# Patient Record
Sex: Female | Born: 1935 | Race: White | Hispanic: No | Smoking: Never smoker
Health system: Southern US, Community
[De-identification: ages and names within clinical notes are randomized; demographics above are authoritative.]

## PROBLEM LIST (undated history)

## (undated) DIAGNOSIS — R42 Dizziness and giddiness: Secondary | ICD-10-CM

## (undated) DIAGNOSIS — M160 Bilateral primary osteoarthritis of hip: Secondary | ICD-10-CM

## (undated) DIAGNOSIS — Z9289 Personal history of other medical treatment: Secondary | ICD-10-CM

## (undated) DIAGNOSIS — G8929 Other chronic pain: Secondary | ICD-10-CM

## (undated) DIAGNOSIS — I38 Endocarditis, valve unspecified: Secondary | ICD-10-CM

## (undated) DIAGNOSIS — I10 Essential (primary) hypertension: Secondary | ICD-10-CM

## (undated) DIAGNOSIS — I480 Paroxysmal atrial fibrillation: Secondary | ICD-10-CM

## (undated) DIAGNOSIS — M5416 Radiculopathy, lumbar region: Secondary | ICD-10-CM

## (undated) DIAGNOSIS — I493 Ventricular premature depolarization: Secondary | ICD-10-CM

## (undated) DIAGNOSIS — E785 Hyperlipidemia, unspecified: Secondary | ICD-10-CM

## (undated) HISTORY — DX: Paroxysmal atrial fibrillation: I48.0

## (undated) HISTORY — DX: Personal history of other medical treatment: Z92.89

## (undated) HISTORY — PX: TONSILLECTOMY: SUR1361

## (undated) HISTORY — DX: Endocarditis, valve unspecified: I38

## (undated) HISTORY — PX: TONGUE BIOPSY: SHX1075

## (undated) HISTORY — DX: Radiculopathy, lumbar region: M54.16

## (undated) HISTORY — DX: Other chronic pain: G89.29

## (undated) HISTORY — PX: NM MYOVIEW LTD: HXRAD82

## (undated) HISTORY — PX: TOTAL ABDOMINAL HYSTERECTOMY W/ BILATERAL SALPINGOOPHORECTOMY: SHX83

## (undated) HISTORY — PX: TRANSTHORACIC ECHOCARDIOGRAM: SHX275

## (undated) HISTORY — DX: Bilateral primary osteoarthritis of hip: M16.0

## (undated) HISTORY — PX: BREAST EXCISIONAL BIOPSY: SUR124

## (undated) HISTORY — DX: Essential (primary) hypertension: I10

## (undated) HISTORY — DX: Ventricular premature depolarization: I49.3

## (undated) HISTORY — DX: Hyperlipidemia, unspecified: E78.5

## (undated) HISTORY — PX: COLONOSCOPY: SHX174

---

## 2004-08-12 ENCOUNTER — Ambulatory Visit: Payer: Self-pay | Admitting: Family Medicine

## 2005-09-23 ENCOUNTER — Ambulatory Visit: Payer: Self-pay | Admitting: Family Medicine

## 2006-12-16 ENCOUNTER — Ambulatory Visit: Payer: Self-pay | Admitting: Family Medicine

## 2007-12-19 ENCOUNTER — Ambulatory Visit: Payer: Self-pay | Admitting: Family Medicine

## 2008-12-20 ENCOUNTER — Ambulatory Visit: Payer: Self-pay | Admitting: Family Medicine

## 2009-12-23 ENCOUNTER — Ambulatory Visit: Payer: Self-pay | Admitting: Family Medicine

## 2010-07-31 ENCOUNTER — Emergency Department: Payer: Self-pay | Admitting: Emergency Medicine

## 2011-02-23 ENCOUNTER — Ambulatory Visit: Payer: Self-pay | Admitting: Family Medicine

## 2011-08-22 ENCOUNTER — Emergency Department: Payer: Self-pay | Admitting: Emergency Medicine

## 2012-01-27 DIAGNOSIS — I493 Ventricular premature depolarization: Secondary | ICD-10-CM

## 2012-01-27 HISTORY — DX: Ventricular premature depolarization: I49.3

## 2012-01-27 HISTORY — PX: OTHER SURGICAL HISTORY: SHX169

## 2012-03-15 ENCOUNTER — Ambulatory Visit: Payer: Self-pay | Admitting: Family Medicine

## 2013-03-17 ENCOUNTER — Ambulatory Visit: Payer: Self-pay | Admitting: Family Medicine

## 2013-05-05 ENCOUNTER — Ambulatory Visit: Payer: Self-pay | Admitting: Physical Medicine and Rehabilitation

## 2013-09-27 ENCOUNTER — Emergency Department: Payer: Self-pay | Admitting: Emergency Medicine

## 2014-01-13 ENCOUNTER — Emergency Department: Payer: Self-pay | Admitting: Emergency Medicine

## 2014-01-13 LAB — CBC
HCT: 42.4 % (ref 35.0–47.0)
HGB: 14 g/dL (ref 12.0–16.0)
MCH: 29.5 pg (ref 26.0–34.0)
MCHC: 32.9 g/dL (ref 32.0–36.0)
MCV: 90 fL (ref 80–100)
Platelet: 223 10*3/uL (ref 150–440)
RBC: 4.73 10*6/uL (ref 3.80–5.20)
RDW: 13.5 % (ref 11.5–14.5)
WBC: 7.5 10*3/uL (ref 3.6–11.0)

## 2014-01-13 LAB — COMPREHENSIVE METABOLIC PANEL
ALK PHOS: 93 U/L
ANION GAP: 9 (ref 7–16)
Albumin: 3.9 g/dL (ref 3.4–5.0)
BILIRUBIN TOTAL: 0.4 mg/dL (ref 0.2–1.0)
BUN: 34 mg/dL — ABNORMAL HIGH (ref 7–18)
Calcium, Total: 8.8 mg/dL (ref 8.5–10.1)
Chloride: 106 mmol/L (ref 98–107)
Co2: 24 mmol/L (ref 21–32)
Creatinine: 1.03 mg/dL (ref 0.60–1.30)
EGFR (African American): 59 — ABNORMAL LOW
EGFR (Non-African Amer.): 51 — ABNORMAL LOW
Glucose: 134 mg/dL — ABNORMAL HIGH (ref 65–99)
OSMOLALITY: 287 (ref 275–301)
POTASSIUM: 3.4 mmol/L — AB (ref 3.5–5.1)
SGOT(AST): 54 U/L — ABNORMAL HIGH (ref 15–37)
SGPT (ALT): 35 U/L (ref 12–78)
Sodium: 139 mmol/L (ref 136–145)
Total Protein: 8 g/dL (ref 6.4–8.2)

## 2014-01-13 LAB — PROTIME-INR
INR: 0.9
Prothrombin Time: 12 secs (ref 11.5–14.7)

## 2014-01-13 LAB — CK TOTAL AND CKMB (NOT AT ARMC)
CK, Total: 110 U/L
CK-MB: 2.6 ng/mL (ref 0.5–3.6)

## 2014-01-13 LAB — TROPONIN I: Troponin-I: <0.02 ng/mL

## 2014-01-13 LAB — APTT: Activated PTT: 32.5 secs (ref 23.6–35.9)

## 2014-03-19 ENCOUNTER — Ambulatory Visit: Payer: Self-pay | Admitting: Family Medicine

## 2014-03-21 DIAGNOSIS — M5416 Radiculopathy, lumbar region: Secondary | ICD-10-CM | POA: Insufficient documentation

## 2014-08-30 DIAGNOSIS — R079 Chest pain, unspecified: Secondary | ICD-10-CM | POA: Insufficient documentation

## 2014-11-21 ENCOUNTER — Ambulatory Visit: Payer: Self-pay | Admitting: Cardiovascular Disease

## 2014-11-28 ENCOUNTER — Ambulatory Visit: Payer: Self-pay | Admitting: Cardiovascular Disease

## 2014-12-11 ENCOUNTER — Ambulatory Visit: Payer: Self-pay | Admitting: Orthopedic Surgery

## 2014-12-12 ENCOUNTER — Encounter: Payer: Self-pay | Admitting: *Deleted

## 2014-12-12 ENCOUNTER — Ambulatory Visit (INDEPENDENT_AMBULATORY_CARE_PROVIDER_SITE_OTHER): Payer: Medicare Other | Admitting: Cardiology

## 2014-12-12 ENCOUNTER — Encounter: Payer: Self-pay | Admitting: Cardiology

## 2014-12-12 VITALS — BP 112/82 | HR 75 | Ht 67.0 in | Wt 161.0 lb

## 2014-12-12 DIAGNOSIS — I10 Essential (primary) hypertension: Secondary | ICD-10-CM

## 2014-12-12 DIAGNOSIS — I8393 Asymptomatic varicose veins of bilateral lower extremities: Secondary | ICD-10-CM

## 2014-12-12 DIAGNOSIS — R000 Tachycardia, unspecified: Secondary | ICD-10-CM

## 2014-12-12 DIAGNOSIS — I493 Ventricular premature depolarization: Secondary | ICD-10-CM

## 2014-12-12 DIAGNOSIS — I38 Endocarditis, valve unspecified: Secondary | ICD-10-CM

## 2014-12-12 NOTE — Patient Instructions (Addendum)
Your physician has recommended you make the following change in your medication:  Take an additional 12.5 mg (1/2 tablet) of metoprolol for tachy palpitation episodes  If you feel your blood pressure is low the following morning after taking additional metoprolol you can take a half tablet of lisinopril (10 mg)  Take your metoprolol at 8 am and 7 pm daily    Start wearing compression stockings as discussed    Your physician wants you to follow-up in: 6 months. You will receive a reminder letter in the mail two months in advance. If you don't receive a letter, please call our office to schedule the follow-up appointment.

## 2014-12-14 ENCOUNTER — Encounter: Payer: Self-pay | Admitting: Cardiology

## 2014-12-14 DIAGNOSIS — I8393 Asymptomatic varicose veins of bilateral lower extremities: Secondary | ICD-10-CM | POA: Insufficient documentation

## 2014-12-14 DIAGNOSIS — I493 Ventricular premature depolarization: Secondary | ICD-10-CM | POA: Insufficient documentation

## 2014-12-14 DIAGNOSIS — I10 Essential (primary) hypertension: Secondary | ICD-10-CM | POA: Insufficient documentation

## 2014-12-14 DIAGNOSIS — R000 Tachycardia, unspecified: Secondary | ICD-10-CM | POA: Insufficient documentation

## 2014-12-14 DIAGNOSIS — I38 Endocarditis, valve unspecified: Secondary | ICD-10-CM | POA: Insufficient documentation

## 2014-12-14 NOTE — Assessment & Plan Note (Signed)
Excellent blood pressure control. She is actually borderline hypotensive. I am a little bit leery of her being on 20 mg of lisinopril with about potentially using extra beta blocker dosing. If I choose to increase her beta blocker dose, I would probably reduce her lisinopril dose to 10 mg.

## 2014-12-14 NOTE — Assessment & Plan Note (Signed)
She is a little bothered by her varicose veins they're somewhat distressing as far as visualization. Her edema is also starting to bother her a little bit.  In the absence of PND and orthopnea with relatively normal echocardiogram, I am inclined to believe that this is more related to venous stasis and right-sided heart failure.  I recommended that she wears Compression Stockings 20-30 gram. I would prefer to avoid diuretics.

## 2014-12-14 NOTE — Progress Notes (Signed)
PATIENT: Brandi Scott MRN: [id number] DOB: 19-Aug-1929 PCP: Jerl MinaHEDRICK, JAMES, MD  Clinic Note: Chief Complaint  Patient presents with  . other    Former Brandi Scott pt would like 2nd opinion. Meds reviewed verbally with pt.  . Palpitations    History of documented PVCs    HPI: Brandi ShearerFlorence J Scott is a 81 y.o. female with a PMH below who presents today for well documented mild, asymptomatic valvular disease with mild mitral insufficiency and congestive insufficiency as well as Holter monitor identified PVCs occasionally in bigeminy. She was [facility] -- she had had an echocardiogram and Myoview back in 2013 we'll monitor. She had a followup echocardiogram in May of 2015 and Myoview in December 2015 -- the echocardiogram revealed improved overall bowel function, and still stable nonischemic Exercise Myoview. The  1 notable thing for her stress test was that she really did not exercise very long. This would indicate decreased exercise capacity.  I reviewed the results from the forwarded reports and have summarized them below the past surgical/past medical history section. -- clinic notes and study results were forwarded from Dr. Gwen Scott and her PCP. She is a close personal friend of a patient who is now seeing me, and asked to be referred for second opinion. She has been treated with low-dose beta blocker for palpitations. She has never had any syncopal episodes and has not been overly symptomatic with exception of just the annoyance of palpitations.  Interval History: she has been having episodic paroxysmal of heart palpitations that seem to last a few minutes to maybe 15 minutes. They're associated with rapid pounding in her chest, able to "hear her heartbeat and she feels a sense of tightness and tingling that would go up and from her chest to her left shoulder and down her arm. She definitely feels are going up and feels that he has weakness down the left greater  than right arm. She has felt as though she may pass out but has not actually truly passed out. She does not drink caffeine.  She notes that her episodesusually happen at night or near the end of the day last evening before bed. In the absence of these episodes, she denies any resting or exertional chest tightness or pressure. She does not have any resting or exertional dyspnea. These episodes are not associated with dyspnea. She has felt lightheaded and dizzy only when the palpitation episodes, no true syncope. No TIA or amaurosis fugax symptoms.  She has bilateral extremity edema that is mild. She does not have any PND or orthopnea. She has varicose veins with some skin discoloration bilaterally but lead to some restless leg sensations and calf pains and cramping. A sense of heaviness and fullness in her legs. This is not exacerbated by exercise or activity.worse when on her feet for long period time and improved with raising her feet.  Past Medical History  Diagnosis Date  . Hyperlipidemia   . Essential hypertension   . 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 Valvular heart disease     2013 Echo: Moderate MR and moderate-severe TR on echocardiogram; Recheck Echo 2015: mild MR & TR  . Chronic radicular low back pain   . Osteoarthritis of both hips     And knees as well as back.     Prior Cardiac Evaluation and Past Surgical History: Past Surgical History  Procedure Laterality Date  . Total  abdominal hysterectomy w/ bilateral salpingoophorectomy    . Tongue biopsy    . Colonoscopy    . Tonsillectomy    . Nm myoview ltd  May 2013; Dec 2015    @ [facility] Cardiology: a) 2013: Exercise for 2:30 min (only), hypertensive response no ischemic changes. He has 74%, normal wall motion, no evidence of ischemia or infarction.; b) Ex 3:00 min (dyspnea) 3.3 METs HR 146 (106% MPHR) - no ECG changes, E 70%, no RWMA, No ischemia or infarction  .  Transthoracic echocardiogram  May 2013; May 2015    @ [facility] Cardiology: a) 2013 : Normal LV function, EF 65%, mild BiAE, Mild LVH, Mod MR, Mod-Severe TR; b) normal LV size, EF> 55%, mild LVH, mild MR, mild TR and  . Holter monitor  May 2013    @ [facility] Cardiology: Occasional PVCs (benign)    No Known Allergies  Current Outpatient Prescriptions  Medication Sig Dispense Refill  . aspirin 81 MG tablet Take 81 mg by mouth daily.    [name] gabapentin (NEURONTIN) 100 MG capsule Take 100 mg by mouth 3 (three) times daily.    [name] lisinopril (PRINIVIL,ZESTRIL) 20 MG tablet Take 20 mg by mouth daily.    . metoprolol tartrate (LOPRESSOR) 25 MG tablet Take 25 mg by mouth 2 (two) times daily.     No current facility-administered medications for this visit.   SOCIAL & FAMILY HISTORY  reports that she has never smoked. She does not have any smokeless tobacco history on file. She reports that she does not drink alcohol or use illicit drugs. she used to walk quite a bit at the mall, but has not been able to do so because of her back and hip pain with radiculopathy.  family history includes Alzheimer's disease in her mother; Heart attack in her father; Heart disease in her brother and maternal grandmother; Heart failure in her mother; Hypertension in her father; Stomach cancer in her brother.  ROS: A comprehensive Review of Systems - was performed Review of Systems  Constitutional: Negative for weight loss and malaise/fatigue.  HENT: Negative for congestion and nosebleeds.   Eyes: Negative for blurred vision.  Respiratory: Negative for cough, shortness of breath and wheezing.   Cardiovascular: Positive for chest pain (Notes discomfort in her chest when she has palpitations) and palpitations (Last episode >65month ago). Negative for claudication.  Gastrointestinal: Negative for blood in stool and melena.  Genitourinary: Negative for hematuria.  Musculoskeletal: Positive for myalgias (Bilateral  leg pain and cramping), back pain and joint pain (Hip and knee). Negative for falls.       Arthritic pains in the back radiating to right hip and the width what sounds like radiculopathy limits her activity.  Neurological: Negative for focal weakness, seizures, loss of consciousness (but occasionally may feel as though she could pass out it heart rate episodes persist), weakness and headaches.       Neuropathic pain mostly noted in the right side down to the knee.  Endo/Heme/Allergies: Does not bruise/bleed easily.  Psychiatric/Behavioral: Negative.  Negative for depression and memory loss. The patient is not nervous/anxious and does not have insomnia.   All other systems reviewed and are negative.  PHYSICAL EXAM BP 112/82 mmHg  Pulse 75  Ht  (1.702 m)  Wt 161 lb (73.029 kg)  BMI 25.21 kg/m2  HEENT: [place]/AT, EOMI, MMM, anicteric sclera General appearance: alert, cooperative, appears stated age, no distress and   welll-nourished and well-groomed. Has mild thoracic  kyphosis. Pleasant mood and affect. Answers questions appropriately. Seems somewhat timid. Neck: no adenopathy, no carotid bruit, no JVD and supple, symmetrical, trachea midline Lungs: clear to auscultation bilaterally, normal percussion bilaterally and Nonlabored, good air movement Heart: RRR, S1/S2 normal, soft HSM at apex with no R./G. Nondisplaced PMI. Abdomen: soft, non-tender; bowel sounds normal; no masses,  no organomegaly and No HJR Extremities: edema Trace/1+, varicose veins noted, venous stasis dermatitis noted and No C./C. No ulcers or lesions Pulses: 2+ and symmetric Skin: Thin, frail skin with diffuse mild ecchymoses. She has venous stasis changes in the extremities but not significant. No rashes or sores/lesions Neurologic: Mental status: alertness: alert, orientation: time, date, person, place, city, president, affect: normal and Pleasant, thought content exhibits logical connections Cranial nerves: normal  MSK:  thoracic kyphosis, normal strength 5/5 in B LE & UE.   Adult ECG Report  Rate: 75 ;  Rhythm: normal sinus rhythm  QRS Axis: 5 ;  PR Interval: 156 ;  QRS Duration: 90 ; QTc: 408 ; Voltages: borderline low  Conduction Disturbances: none;  Other Abnormalities: none  Narrative Interpretation: essentially stable/benign EKG  Recent Labs: none available  ASSESSMENT / PLAN: Very pleasant elderly woman with a long-standing history of palpitations.    I spent quite a long time (greater than 20 minutes),in addition to the 20 minutes evaluating the patient and reviewing her chart,  discussing my thoughts on her prior care and recommendations and discussing vagal maneuvers etc. Greater than 50% of the time spent was in counseling. In addition to that time with the patient I also spent well over 25 minutes reviewing her chart and in putting pertinent data in the Past Medical History/Past Surgical History section.  Problem List Items Addressed This Visit    Asymptomatic varicose veins of bilateral lower extremities: With mild edema (Chronic)    She is a little bothered by her varicose veins they're somewhat distressing as far as visualization. Her edema is also starting to bother her a little bit.  In the absence of PND and orthopnea with relatively normal echocardiogram, I am inclined to believe that this is more related to venous stasis and right-sided heart failure.  I recommended that she wears Compression Stockings 20-30 gram. I would prefer to avoid diuretics.        Essential hypertension (Chronic)    Excellent blood pressure control. She is actually borderline hypotensive. I am a little bit leery of her being on 20 mg of lisinopril with about potentially using extra beta blocker dosing. If I choose to increase her beta blocker dose, I would probably reduce her lisinopril dose to 10 mg.      Relevant Orders   EKG 12-Lead (Completed)   Frequent PVCs (Chronic)    On beta blocker. I am not sure  if these episodes are related to PVCs. She's had negative ischemic evaluation twice and relatively normal echocardiogram.      Mild mitral and tricuspid regurgitation on recent echo (Chronic)    Certainly when she had 3+ TR back in 2013, one could suspect there possibly being elevated pulmonary pressures the duodenum. However her recent echo did not suggest that she has no JVD nor does she have any PND orthopnea dyspnea just that her edema has do with a cardiac etiology. Mild MR and TR on echocardiogram and a 81 year old woman is not unexpected.  I would not pursue any additional evaluation.      Tachycardia palpitations - Primary (Chronic)    Her  episodes seem quite symptomatic, but very few and far between. She says her last episode was over a month ago.  She can think of no potential triggering conditions. The only thing she notes is that they usually occur in the evening. She does not drink coffee. She has had a Holter monitor that showed PVCs, however she states that the Holter monitor did not capture one of these episodes when she was wearing it. Unfortunately there is no real good option besides maybe a loop recorder to definitively know what happened. She can wear and monitor for entire month and not have an episode. She is on a beta blocker which should likely keep these episodes a day. I suggested that she could take an extra half to full dose for an episode of palpitations. Also discussed vagal maneuvers. In order to avoid orthostatic hypotension symptoms with increased beta blocker dose I would recommend that she takes additional beta blocker dose if she cuts her ACE inhibitor dose in half. I also recommend that she takes her beta blocker a little bit earlier in the evening as most of her episodes seem to happen at night.  She has had an ischemic evaluation an echocardiogram done very recently which were normal. I don't think any additional evaluation from that standpoint is warranted. I  simply think that expectant management is the best option.         No orders of the defined types were placed in this encounter.    Followup: 6 months  Mailynn Everly, Piedad Climes, M.D., M.S. Interventional Cardiologist   Pager # [phone]

## 2014-12-14 NOTE — Assessment & Plan Note (Signed)
Her episodes seem quite symptomatic, but very few and far between. She says her last episode was over a month ago.  She can think of no potential triggering conditions. The only thing she notes is that they usually occur in the evening. She does not drink coffee. She has had a Holter monitor that showed PVCs, however she states that the Holter monitor did not capture one of these episodes when she was wearing it. Unfortunately there is no real good option besides maybe a loop recorder to definitively know what happened. She can wear and monitor for entire month and not have an episode. She is on a beta blocker which should likely keep these episodes a day. I suggested that she could take an extra half to full dose for an episode of palpitations. Also discussed vagal maneuvers. In order to avoid orthostatic hypotension symptoms with increased beta blocker dose I would recommend that she takes additional beta blocker dose if she cuts her ACE inhibitor dose in half. I also recommend that she takes her beta blocker a little bit earlier in the evening as most of her episodes seem to happen at night.  She has had an ischemic evaluation an echocardiogram done very recently which were normal. I don't think any additional evaluation from that standpoint is warranted. I simply think that expectant management is the best option.

## 2014-12-14 NOTE — Assessment & Plan Note (Signed)
Certainly when she had 3+ TR back in 2013, one could suspect there possibly being elevated pulmonary pressures the duodenum. However her recent echo did not suggest that she has no JVD nor does she have any PND orthopnea dyspnea just that her edema has do with a cardiac etiology. Mild MR and TR on echocardiogram and a 81 year old woman is not unexpected.  I would not pursue any additional evaluation.

## 2014-12-14 NOTE — Assessment & Plan Note (Signed)
On beta blocker. I am not sure if these episodes are related to PVCs. She's had negative ischemic evaluation twice and relatively normal echocardiogram.

## 2015-05-20 ENCOUNTER — Other Ambulatory Visit: Payer: Self-pay | Admitting: Family Medicine

## 2015-05-20 DIAGNOSIS — Z1231 Encounter for screening mammogram for malignant neoplasm of breast: Secondary | ICD-10-CM

## 2015-05-21 ENCOUNTER — Other Ambulatory Visit: Payer: Self-pay | Admitting: Family Medicine

## 2015-05-21 ENCOUNTER — Ambulatory Visit
Admission: RE | Admit: 2015-05-21 | Discharge: 2015-05-21 | Disposition: A | Discharge status: Home / Self Care | Payer: Medicare Other | Source: Ambulatory Visit | Attending: Family Medicine | Admitting: Family Medicine

## 2015-05-21 DIAGNOSIS — Z1231 Encounter for screening mammogram for malignant neoplasm of breast: Secondary | ICD-10-CM

## 2015-05-29 DIAGNOSIS — M48062 Spinal stenosis, lumbar region with neurogenic claudication: Secondary | ICD-10-CM | POA: Insufficient documentation

## 2015-06-12 ENCOUNTER — Ambulatory Visit (INDEPENDENT_AMBULATORY_CARE_PROVIDER_SITE_OTHER): Payer: Medicare Other | Admitting: Cardiology

## 2015-06-12 ENCOUNTER — Encounter: Payer: Self-pay | Admitting: Cardiology

## 2015-06-12 VITALS — BP 100/62 | HR 100 | Ht 65.0 in | Wt 155.0 lb

## 2015-06-12 DIAGNOSIS — I8393 Asymptomatic varicose veins of bilateral lower extremities: Secondary | ICD-10-CM

## 2015-06-12 DIAGNOSIS — I493 Ventricular premature depolarization: Secondary | ICD-10-CM | POA: Diagnosis not present

## 2015-06-12 DIAGNOSIS — R000 Tachycardia, unspecified: Secondary | ICD-10-CM | POA: Diagnosis not present

## 2015-06-12 DIAGNOSIS — I10 Essential (primary) hypertension: Secondary | ICD-10-CM | POA: Diagnosis not present

## 2015-06-12 NOTE — Patient Instructions (Signed)
Medication Instructions:  Your physician recommends that you continue on your current medications as directed. Please refer to the Current Medication list given to you today.   Labwork: none  Testing/Procedures: none  Follow-Up: Your physician wants you to follow-up in: six months with Dr. Herbie Baltimore. You will receive a reminder letter in the mail two months in advance. If you don't receive a letter, please call our office to schedule the follow-up appointment.   Any Other Special Instructions Will Be Listed Below (If Applicable). For better heart rate control, you may take your evening dose of metoprolol earlier in the day. If heart rate continues to be elevated, you may take ONE extra dose of metoprolol in a day

## 2015-06-12 NOTE — Progress Notes (Signed)
PCP: [name], MD  Clinic Note: Chief Complaint  Patient presents with  . Palpitations    6 month follow up. Pt. had a spell of rapid heart beats 2-3 weeks ago, with feeling like could black out with shoulder and left arm pain/weakness with chest pain. Meds reviewed by the patient verbally.     HPI: Brandi Scott is a 81 y.o. female with a PMH below who presents today for 6 month f/u. She has well documented mild, asymptomatic valvular disease with mild mitral insufficiency and congestive insufficiency as well as Holter monitor identified PVCs occasionally in bigeminy. She was seeing Dr. Gwen Pounds at the [facility] -- she had had an echocardiogram and Myoview back in 2013 we'll monitor. She had a followup echocardiogram in May of 2015 and Myoview in December 2015 -- the echocardiogram revealed improved overall valve function, and still stable nonischemic Exercise Myoview. The 1 notable thing for her stress test was that she really did not exercise very long. This would indicate decreased exercise capacity.  Brandi Scott was last seen in March 2016.  Noted intermittent paroxysmal silk heart palpitations lasting a few minutes. Able to hear the heart rate. Mild bilateral lower short edema. No chest tightness or pressure except when heart racing.  Recent Hospitalizations: n/a  Studies Reviewed: no new studies  Since I last saw her, she has stopped taking Neurontin voluntarily. She does note that the neuropathy and shaking his recur now that she is all Neurontin.  Interval History: She presents today really without much in the way of complaints besides having one spell of rapid heartbeat lasting about 15 minutes on about 2-3 weeks ago. Basically she saw a snake in her house and got very anxious and scared. She felt quite panicky and noted that her heart rate went up dramatically. She felt lightheaded and dizzy and little bit short of breath this occurred. She did not actually  have true syncope but did have near syncopal symptoms. She had some discomfort in her left shoulder and arm. It was mostly a weak sensation. Overall this episode lasted about our and resolved spontaneously. She has never had something like this as traumatic as the current episode and has not had any since.  She started using the compression stockings for her lower she'll be edema and is noted dramatic improvement. She still has about 1-2+ edema, but is much better than it was before.  No PND, orthopnea   No chest pain or shortness of breath with rest or exertion. Besides this one episode noted above, she does feel the occasional palpitations  -  However, these is not associated with lightheadedness, dizziness, weakness or syncope/near syncope. No TIA/amaurosis fugax symptoms. No melena, hematochezia, , or epstaxis.  She occasionally has hematuria in the setting of a UTI. She actually was in to has gotten over UTI. No claudication.  Past Medical History  Diagnosis Date  . Hyperlipidemia   . Essential hypertension   . 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 Valvular heart disease     2013 Echo: Moderate MR and moderate-severe TR on echocardiogram; Recheck Echo 2015: mild MR & TR  . Chronic radicular low back pain   . Osteoarthritis of both hips     And knees as well as back.     Past Surgical History  Procedure Laterality Date  . Total abdominal hysterectomy w/ bilateral salpingoophorectomy    . Tongue biopsy    .  Colonoscopy    . Tonsillectomy    . Nm myoview ltd  May 2013; Dec 2015    @ [facility] Cardiology: a) 2013: Exercise for 2:30 min (only), hypertensive response no ischemic changes. He has 74%, normal wall motion, no evidence of ischemia or infarction.; b) Ex 3:00 min (dyspnea) 3.3 METs HR 146 (106% MPHR) - no ECG changes, E 70%, no RWMA, No ischemia or infarction  . Transthoracic echocardiogram  May 2013; May 2015    @ [facility] Cardiology: a) 2013 : Normal LV function, EF 65%, mild BiAE, Mild LVH, Mod MR, Mod-Severe TR; b) normal LV size, EF> 55%, mild LVH, mild MR, mild TR and  . Holter monitor  May 2013    @ [facility] Cardiology: Occasional PVCs (benign)  . Breast biopsy Right 1971, 1981    negative    ROS: A comprehensive was performed. Review of Systems  Constitutional: Negative for malaise/fatigue.  Respiratory: Negative for sputum production.   Cardiovascular: Positive for palpitations.  Gastrointestinal: Negative for heartburn, diarrhea and constipation.  Genitourinary:       Recent UTI with dysuria mild hematuria, no flank pain  Musculoskeletal: Positive for back pain and joint pain (Mostly hips, also in the knees).  Neurological: Positive for dizziness (During the episode of the above). Negative for headaches.       Neuropathic pain in her legs.  Endo/Heme/Allergies: Does not bruise/bleed easily.  All other systems reviewed and are negative.   Prior to Admission medications   Medication Sig Start Date End Date Taking? Authorizing Provider  aspirin 81 MG tablet Take 81 mg by mouth daily.   Yes Historical Provider, MD  lisinopril (PRINIVIL,ZESTRIL) 20 MG tablet Take 20 mg by mouth daily.   Yes Historical Provider, MD  metoprolol tartrate (LOPRESSOR) 25 MG tablet Take 25 mg by mouth 2 (two) times daily.   Yes Historical Provider, MD   No Known Allergies   Social History   Social History  . Marital Status: Married    Spouse Name: N/A  . Number of Children: N/A  . Years of Education: N/A   Social History Main Topics  . Smoking status: Never Smoker   . Smokeless tobacco: None  . Alcohol Use: No  . Drug Use: No  . Sexual Activity: Not Asked   Other Topics Concern  . None   Social History Narrative   Family History  Problem Relation Age of Onset  . Alzheimer's disease Mother   . Heart failure Mother   . Hypertension Father   . Heart attack Father   . Heart disease  Maternal Grandmother   . Heart disease Brother   . Stomach cancer Brother     Wt Readings from Last 3 Encounters:  06/12/15 155 lb (70.308 kg)  12/12/14 161 lb (73.029 kg)    PHYSICAL EXAM BP 100/62 mmHg  Pulse 100  Ht 5\' 5"  (1.651 m)  Wt 155 lb (70.308 kg)  BMI 25.79 kg/m2  General appearance: alert, cooperative, appears stated age, no distress and  welll-nourished and well-groomed. Has mild thoracic kyphosis. Pleasant mood and affect. Answers questions appropriately. Seems somewhat timid. HEENT: [place]/AT, EOMI, MMM, anicteric sclera Neck: no adenopathy, no carotid bruit, no JVD and supple, symmetrical, trachea midline Lungs: clear to auscultation bilaterally, normal percussion bilaterally and Nonlabored, good air movement Heart: RRR, S1/S2 normal, soft HSM at apex with no R./G. Nondisplaced PMI. Abdomen: soft, non-tender; bowel sounds normal; no masses, no organomegaly and No HJR Extremities: edema  Trace/1+  With Compression stockings in place Skin: Thin, frail skin with diffuse mild ecchymoses. - I did not remove her compression stockings to evaluate for venous stasis changes. She denies any rashes or sores/lesions Neurologic: Mental status:  A&O x3, thought content exhibits logical connections Cranial nerves: normal  MSK: thoracic kyphosis, normal strength 5/5 in B LE & UE.    Adult ECG Report  Rate: 100 ;  Rhythm: Borderline sinus tachycardia with occasional PVCs. Low-voltage. Cannot exclude inferior and lateral infarct, age undetermined (fine Q waves noted);   Narrative Interpretation: relatively stable EKG with exception of the lateral Q waves more prominent in current EKG   Other studies Reviewed: Additional studies/ records that were reviewed today include:  Recent Labs:  No recent labs available   ASSESSMENT / PLAN: Problem List Items Addressed This Visit    Asymptomatic varicose veins of bilateral lower extremities: With mild edema (Chronic)    Notable improvement  with compression stockings. Not requiring diuretic.  She is very happy with the results and is happy not to take a diuretic.      Essential hypertension (Chronic)    Borderline hypotensive. Stable. If she has episodes of lightheadedness or dizziness, recommend cutting her lisinopril in half.      Frequent PVCs (Chronic)    Relatively stable. No significant episodes of symptomatic PVCs. On beta blocker. History of negative ischemic evaluation and normal echo.      Tachycardia palpitations - Primary (Chronic)    Really the only episode he's had in the interval since her last visit was really triggered by seeing a black snake in her house. That was probably more related to a panic attack. It was self-limiting and did not lead to true syncope.  We talked about options of perhaps a manage of recurrent episodes. I did discuss vagal and there is an option. But as a when necessary I want her to take either the upcoming metoprolol dose simply has an early dose or an additional dose as needed      Relevant Orders   EKG 12-Lead (Completed)      Current medicines are reviewed at length with the patient today. (+/- concerns) none The following changes have been made: See Patient instructions Studies Ordered:   Orders Placed This Encounter  Procedures  . EKG 12-Lead     PATIENT INSTRUCTIONS For better heart rate control, you may take your evening dose of metoprolol earlier in the day. If heart rate continues to be elevated, you may take ONE extra dose of metoprolol in a day   ROV - 6 months   HARDING, Piedad Climes, M.D., M.S. Interventional Cardiologist   Pager # [phone]

## 2015-06-14 ENCOUNTER — Encounter: Payer: Self-pay | Admitting: Cardiology

## 2015-06-14 NOTE — Assessment & Plan Note (Signed)
Really the only episode he's had in the interval since her last visit was really triggered by seeing a black snake in her house. That was probably more related to a panic attack. It was self-limiting and did not lead to true syncope.  We talked about options of perhaps a manage of recurrent episodes. I did discuss vagal and there is an option. But as a when necessary I want her to take either the upcoming metoprolol dose simply has an early dose or an additional dose as needed

## 2015-06-14 NOTE — Assessment & Plan Note (Signed)
Notable improvement with compression stockings. Not requiring diuretic.  She is very happy with the results and is happy not to take a diuretic.

## 2015-06-14 NOTE — Assessment & Plan Note (Signed)
Borderline hypotensive. Stable. If she has episodes of lightheadedness or dizziness, recommend cutting her lisinopril in half.

## 2015-06-14 NOTE — Assessment & Plan Note (Signed)
Relatively stable. No significant episodes of symptomatic PVCs. On beta blocker. History of negative ischemic evaluation and normal echo.

## 2015-12-11 ENCOUNTER — Ambulatory Visit (INDEPENDENT_AMBULATORY_CARE_PROVIDER_SITE_OTHER): Payer: Medicare Other | Admitting: Cardiology

## 2015-12-11 ENCOUNTER — Encounter: Payer: Self-pay | Admitting: Cardiology

## 2015-12-11 VITALS — BP 100/80 | HR 80 | Ht 65.0 in | Wt 154.0 lb

## 2015-12-11 DIAGNOSIS — I38 Endocarditis, valve unspecified: Secondary | ICD-10-CM

## 2015-12-11 DIAGNOSIS — I10 Essential (primary) hypertension: Secondary | ICD-10-CM

## 2015-12-11 DIAGNOSIS — I493 Ventricular premature depolarization: Secondary | ICD-10-CM

## 2015-12-11 DIAGNOSIS — I8393 Asymptomatic varicose veins of bilateral lower extremities: Secondary | ICD-10-CM

## 2015-12-11 NOTE — Patient Instructions (Signed)
Medication Instructions:  Your physician recommends that you continue on your current medications as directed. Please refer to the Current Medication list given to you today.   Labwork: none  Testing/Procedures: none  Follow-Up: Your physician wants you to follow-up in: one year with Dr. Harding.  You will receive a reminder letter in the mail two months in advance. If you don't receive a letter, please call our office to schedule the follow-up appointment.   Any Other Special Instructions Will Be Listed Below (If Applicable).     If you need a refill on your cardiac medications before your next appointment, please call your pharmacy.   

## 2015-12-11 NOTE — Assessment & Plan Note (Signed)
She really does not have much the way of any significant murmur. Mild MR and TR probably not overly unusual for someone her age. At this point I really think managing her medically and monitoring for worsening murmur symptoms are worsening this visit. Would not recheck an echocardiogram at this time. She is not having dyspnea concern to consider diuretic.

## 2015-12-11 NOTE — Assessment & Plan Note (Signed)
Doing great with compression hose. Not requiring diuretic. Would prefer to avoid diuretic.

## 2015-12-11 NOTE — Progress Notes (Signed)
PCP: [name], MD  Clinic Note: Chief Complaint  Patient presents with  . Follow-up    no chest pain, sob or swelling. no complaints  . Hypertension  . Cardiac Valve Problem    HPI: Brandi Scott is a 82 y.o. female with a PMH below who presents today for 6 month f/u. She has well documented mild, asymptomatic valvular disease with mild mitral insufficiency and congestive insufficiency as well as Holter monitor identified PVCs occasionally in bigeminy.  She had previously been seen by Dr. Gwen Pounds at the [facility] -- she had had an echocardiogram and Myoview back in 2013 along with a monitor.  She had a followup echocardiogram in May of 2015 and Myoview in December 2015 -- the echocardiogram revealed improved overall valve function, and still stable nonischemic Exercise Myoview. The 1 notable thing for her stress test was that she really did not exercise very long. This would indicate decreased exercise capacity.  Brandi Scott was last seen in September  2016.  She had maybe one spell of rapid heart rates that lasted about 15 minutes at that time. That was triggered by a very frightful situation, having seen a snake in her house. When she had a prolonged episode of tachycardia, she did note some tightness or chest, but has not otherwise.  Recent Hospitalizations: n/a  Studies Reviewed: no new studies  Since I last saw her, she has stopped taking Neurontin voluntarily. She does note that the neuropathy and shaking his recur now that she is all Neurontin.  Interval History: Brandi Scott presents today really without complaints. She had one episode of palpitations lasting maybe 5 minutes since her last visit. She has not had use any additional beta blocker. She has been very stable overall with no complaints.  She is relatively active and does whatever she wants to do without much the way of any difficulty. Has not noted any fatigue or myalgias or malaise from being on a beta  blocker. She has been wearing compression hose for her lower extremity edema that seems to have edema at bay. She does have some mild swelling but much improved after starting use the compression hose. She states that this started to get easier to put on the morning because the swelling is better.  No chest pain or shortness of breath with rest or exertion. No PND, orthopnea. No syncope/near syncope or TIA/amaurosis fugax No TIA/amaurosis fugax symptoms. No melena, hematochezia, , or epstaxis. No hematuria  No claudication.   ROS: A comprehensive was performed. Review of Systems  Constitutional: Negative for malaise/fatigue.  Respiratory: Negative for sputum production.   Cardiovascular: Positive for palpitations (1 episode lasting maybe 5 minutes since her last visit).  Gastrointestinal: Negative for heartburn, diarrhea, constipation, blood in stool and melena.  Genitourinary: Negative for hematuria and flank pain.  Musculoskeletal: Positive for back pain and joint pain (Mostly hips, also in the knees).  Neurological: Positive for dizziness (Mostly positional now. Very rare). Negative for headaches.       Neuropathic pain in her legs.  Endo/Heme/Allergies: Does not bruise/bleed easily.  All other systems reviewed and are negative.  Past Medical History  Diagnosis Date  . Hyperlipidemia   . Essential hypertension   . 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 Valvular heart disease     2013 Echo: Moderate MR and moderate-severe TR on echocardiogram; Recheck Echo 2015: mild MR & TR  . Chronic radicular  low back pain   . Osteoarthritis of both hips     And knees as well as back.    Past Surgical History  Procedure Laterality Date  . Total abdominal hysterectomy w/ bilateral salpingoophorectomy    . Tongue biopsy    . Colonoscopy    . Tonsillectomy    . Nm myoview ltd  May 2013; Dec 2015    @ [facility] Cardiology: a) 2013:  Exercise for 2:30 min (only), hypertensive response no ischemic changes. He has 74%, normal wall motion, no evidence of ischemia or infarction.; b) Ex 3:00 min (dyspnea) 3.3 METs HR 146 (106% MPHR) - no ECG changes, E 70%, no RWMA, No ischemia or infarction  . Transthoracic echocardiogram  May 2013; May 2015    @ [facility] Cardiology: a) 2013 : Normal LV function, EF 65%, mild BiAE, Mild LVH, Mod MR, Mod-Severe TR; b) normal LV size, EF> 55%, mild LVH, mild MR, mild TR and  . Holter monitor  May 2013    @ [facility] Cardiology: Occasional PVCs (benign)  . Breast biopsy Right 1971, 1981    negative    Prior to Admission medications   Medication Sig Start Date End Date Taking? Authorizing Provider  aspirin 81 MG tablet Take 81 mg by mouth daily.   Yes Historical Provider, MD  lisinopril (PRINIVIL,ZESTRIL) 20 MG tablet Take 20 mg by mouth daily.   Yes Historical Provider, MD  metoprolol tartrate (LOPRESSOR) 25 MG tablet Take 25 mg by mouth 2 (two) times daily.   Yes Historical Provider, MD   No Known Allergies   Social History   Social History  . Marital Status: Married    Spouse Name: N/A  . Number of Children: N/A  . Years of Education: N/A   Social History Main Topics  . Smoking status: Never Smoker   . Smokeless tobacco: None  . Alcohol Use: No  . Drug Use: No  . Sexual Activity: Not Asked   Other Topics Concern  . None   Social History Narrative   Family History  Problem Relation Age of Onset  . Alzheimer's disease Mother   . Heart failure Mother   . Hypertension Father   . Heart attack Father   . Heart disease Maternal Grandmother   . Heart disease Brother   . Stomach cancer Brother     Wt Readings from Last 3 Encounters:  12/11/15 154 lb (69.854 kg)  06/12/15 155 lb (70.308 kg)  12/12/14 161 lb (73.029 kg)    PHYSICAL EXAM BP 100/80 mmHg  Pulse 80  Ht  (1.651 m)  Wt 154 lb (69.854 kg)  BMI 25.63 kg/m2  General appearance: alert,  cooperative, appears stated age, no distress and  welll-nourished and well-groomed. Has mild thoracic kyphosis. Pleasant mood and affect. Answers questions appropriately. Seems somewhat timid. HEENT: [place]/AT, EOMI, MMM, anicteric sclera Neck: no adenopathy, no carotid bruit, no JVD and supple, symmetrical, trachea midline Lungs: clear to auscultation bilaterally, normal percussion bilaterally and Nonlabored, good air movement Heart: RRR, S1/S2 normal, soft HSM at apex with no R./G. Nondisplaced PMI. Abdomen: soft, non-tender; bowel sounds normal; no masses, no organomegaly and No HJR Extremities: edema Trace/1+  With Compression stockings in place Neurologic: Mental status:  A&O x3, thought content exhibits logical connections Cranial nerves: normal  MSK: thoracic kyphosis, normal strength 5/5 in B LE & UE.    Adult ECG Report - not checked   Other studies Reviewed: Additional studies/ records that were  reviewed today include:  Recent Labs:  No recent labs available   ASSESSMENT / PLAN: Problem List Items Addressed This Visit    Mild mitral and tricuspid regurgitation on recent echo (Chronic)    She really does not have much the way of any significant murmur. Mild MR and TR probably not overly unusual for someone her age. At this point I really think managing her medically and monitoring for worsening murmur symptoms are worsening this visit. Would not recheck an echocardiogram at this time. She is not having dyspnea concern to consider diuretic.      Frequent PVCs - Primary (Chronic)    Pretty stable now. She really has not had these additional dose of Lopressor. I did reiterate to her that she could take an extra dose or extrahepatic dose if she needed to.      Essential hypertension (Chronic)    Again she is borderline hypotensive. As I mentioned the past I would consider reducing her lisinopril dose to 10 mg. I instructed her to take a half a tablet only if she becomes notably  dizzy. I would almost prefer for her to have a little bit of hypertension and hypotension. She certainly has pulmonary vascular reduction which may be having significant diastolic symptoms.      Asymptomatic varicose veins of bilateral lower extremities: With mild edema (Chronic)    Doing great with compression hose. Not requiring diuretic. Would prefer to avoid diuretic.         Current medicines are reviewed at length with the patient today. (+/- concerns) none The following changes have been made: See Patient instructions Studies Ordered:   No orders of the defined types were placed in this encounter.     PATIENT INSTRUCTIONS For better heart rate control, you may take your evening dose of metoprolol earlier in the day. If heart rate continues to be elevated, you may take ONE extra dose of metoprolol in a day   ROV - 1 Year   Glenetta Kiger, Piedad ClimesAVID W, M.D., M.S. Interventional Cardiologist   Pager # [phone]

## 2015-12-11 NOTE — Assessment & Plan Note (Signed)
Pretty stable now. She really has not had these additional dose of Lopressor. I did reiterate to her that she could take an extra dose or extrahepatic dose if she needed to.

## 2015-12-11 NOTE — Assessment & Plan Note (Signed)
Again she is borderline hypotensive. As I mentioned the past I would consider reducing her lisinopril dose to 10 mg. I instructed her to take a half a tablet only if she becomes notably dizzy. I would almost prefer for her to have a little bit of hypertension and hypotension. She certainly has pulmonary vascular reduction which may be having significant diastolic symptoms.

## 2016-06-22 ENCOUNTER — Other Ambulatory Visit: Payer: Self-pay | Admitting: Family Medicine

## 2016-06-22 DIAGNOSIS — Z1231 Encounter for screening mammogram for malignant neoplasm of breast: Secondary | ICD-10-CM

## 2016-07-16 ENCOUNTER — Ambulatory Visit
Admission: RE | Admit: 2016-07-16 | Discharge: 2016-07-16 | Disposition: A | Discharge status: Home / Self Care | Payer: Medicare Other | Source: Ambulatory Visit | Attending: Family Medicine | Admitting: Family Medicine

## 2016-07-16 DIAGNOSIS — Z1231 Encounter for screening mammogram for malignant neoplasm of breast: Secondary | ICD-10-CM | POA: Insufficient documentation

## 2016-09-07 ENCOUNTER — Emergency Department: Payer: Medicare Other

## 2016-09-07 ENCOUNTER — Encounter: Payer: Self-pay | Admitting: Emergency Medicine

## 2016-09-07 ENCOUNTER — Inpatient Hospital Stay: Payer: Medicare Other | Admitting: Anesthesiology

## 2016-09-07 ENCOUNTER — Inpatient Hospital Stay
Admission: EM | Admit: 2016-09-07 | Discharge: 2016-09-09 | DRG: 494 | Disposition: A | Discharge status: Skilled Nursing Facility | Payer: Medicare Other | Attending: Internal Medicine | Admitting: Internal Medicine

## 2016-09-07 ENCOUNTER — Encounter
Admission: EM | Disposition: A | Discharge status: Skilled Nursing Facility | Payer: Self-pay | Source: Home / Self Care | Attending: Internal Medicine

## 2016-09-07 DIAGNOSIS — S82892A Other fracture of left lower leg, initial encounter for closed fracture: Secondary | ICD-10-CM

## 2016-09-07 DIAGNOSIS — M160 Bilateral primary osteoarthritis of hip: Secondary | ICD-10-CM | POA: Diagnosis present

## 2016-09-07 DIAGNOSIS — W000XXA Fall on same level due to ice and snow, initial encounter: Secondary | ICD-10-CM | POA: Diagnosis present

## 2016-09-07 DIAGNOSIS — M545 Low back pain: Secondary | ICD-10-CM | POA: Diagnosis present

## 2016-09-07 DIAGNOSIS — R262 Difficulty in walking, not elsewhere classified: Secondary | ICD-10-CM

## 2016-09-07 DIAGNOSIS — M170 Bilateral primary osteoarthritis of knee: Secondary | ICD-10-CM | POA: Diagnosis present

## 2016-09-07 DIAGNOSIS — G8929 Other chronic pain: Secondary | ICD-10-CM | POA: Diagnosis present

## 2016-09-07 DIAGNOSIS — Z90710 Acquired absence of both cervix and uterus: Secondary | ICD-10-CM

## 2016-09-07 DIAGNOSIS — M6281 Muscle weakness (generalized): Secondary | ICD-10-CM

## 2016-09-07 DIAGNOSIS — I493 Ventricular premature depolarization: Secondary | ICD-10-CM | POA: Diagnosis not present

## 2016-09-07 DIAGNOSIS — Z7982 Long term (current) use of aspirin: Secondary | ICD-10-CM

## 2016-09-07 DIAGNOSIS — S82842A Displaced bimalleolar fracture of left lower leg, initial encounter for closed fracture: Principal | ICD-10-CM | POA: Diagnosis present

## 2016-09-07 DIAGNOSIS — I081 Rheumatic disorders of both mitral and tricuspid valves: Secondary | ICD-10-CM | POA: Diagnosis present

## 2016-09-07 DIAGNOSIS — Z8249 Family history of ischemic heart disease and other diseases of the circulatory system: Secondary | ICD-10-CM

## 2016-09-07 DIAGNOSIS — E785 Hyperlipidemia, unspecified: Secondary | ICD-10-CM | POA: Diagnosis present

## 2016-09-07 DIAGNOSIS — K219 Gastro-esophageal reflux disease without esophagitis: Secondary | ICD-10-CM | POA: Diagnosis present

## 2016-09-07 DIAGNOSIS — Z79899 Other long term (current) drug therapy: Secondary | ICD-10-CM

## 2016-09-07 DIAGNOSIS — I10 Essential (primary) hypertension: Secondary | ICD-10-CM | POA: Diagnosis present

## 2016-09-07 HISTORY — PX: ORIF ANKLE FRACTURE: SHX5408

## 2016-09-07 LAB — BASIC METABOLIC PANEL
ANION GAP: 8 (ref 5–15)
BUN: 28 mg/dL — AB (ref 6–20)
CALCIUM: 9.7 mg/dL (ref 8.9–10.3)
CO2: 23 mmol/L (ref 22–32)
Chloride: 109 mmol/L (ref 101–111)
Creatinine, Ser: 0.97 mg/dL (ref 0.44–1.00)
GFR calc Af Amer: >60 mL/min (ref >60)
GFR, EST NON AFRICAN AMERICAN: 52 mL/min — AB (ref >60)
GLUCOSE: 102 mg/dL — AB (ref 65–99)
Potassium: 4.2 mmol/L (ref 3.5–5.1)
Sodium: 140 mmol/L (ref 135–145)

## 2016-09-07 LAB — CBC WITH DIFFERENTIAL/PLATELET
BASOS ABS: 0.1 10*3/uL (ref 0–0.1)
BASOS PCT: 1 %
EOS PCT: 1 %
Eosinophils Absolute: 0.1 10*3/uL (ref 0–0.7)
HCT: 42.3 % (ref 35.0–47.0)
Hemoglobin: 14.1 g/dL (ref 12.0–16.0)
Lymphocytes Relative: 8 %
Lymphs Abs: 0.9 10*3/uL — ABNORMAL LOW (ref 1.0–3.6)
MCH: 30.1 pg (ref 26.0–34.0)
MCHC: 33.4 g/dL (ref 32.0–36.0)
MCV: 90.1 fL (ref 80.0–100.0)
MONO ABS: 0.8 10*3/uL (ref 0.2–0.9)
Monocytes Relative: 7 %
Neutro Abs: 9.9 10*3/uL — ABNORMAL HIGH (ref 1.4–6.5)
Neutrophils Relative %: 85 %
PLATELETS: 240 10*3/uL (ref 150–440)
RBC: 4.69 MIL/uL (ref 3.80–5.20)
RDW: 13.2 % (ref 11.5–14.5)
WBC: 11.6 10*3/uL — ABNORMAL HIGH (ref 3.6–11.0)

## 2016-09-07 LAB — MRSA PCR SCREENING: MRSA BY PCR: POSITIVE — AB

## 2016-09-07 SURGERY — OPEN REDUCTION INTERNAL FIXATION (ORIF) ANKLE FRACTURE
Anesthesia: General | Site: Ankle | Laterality: Left | Wound class: Clean

## 2016-09-07 MED ORDER — ACETAMINOPHEN 650 MG RE SUPP: 650.0000 mg | Freq: Four times a day (QID) | RECTAL | Status: DC | PRN

## 2016-09-07 MED ORDER — MAGNESIUM HYDROXIDE 400 MG/5ML PO SUSP: 30.0000 mL | Freq: Every day | ORAL | Status: DC | PRN

## 2016-09-07 MED ORDER — ONDANSETRON HCL 4 MG/2ML IJ SOLN: 4.0000 mg | Freq: Four times a day (QID) | INTRAMUSCULAR | Status: DC | PRN

## 2016-09-07 MED ORDER — TRAMADOL HCL 50 MG PO TABS
50.0000 mg | ORAL_TABLET | ORAL | Status: DC | PRN
  Administered 2016-09-07 – 2016-09-08 (×3): 50 mg via ORAL
  Filled 2016-09-07 (×3): qty 1

## 2016-09-07 MED ORDER — LIDOCAINE HCL (CARDIAC) 20 MG/ML IV SOLN
INTRAVENOUS | Status: DC | PRN
  Administered 2016-09-07: 80 mg via INTRAVENOUS

## 2016-09-07 MED ORDER — HYDROCODONE-ACETAMINOPHEN 5-325 MG PO TABS: 1.0000 | ORAL_TABLET | ORAL | Status: DC | PRN

## 2016-09-07 MED ORDER — SUCCINYLCHOLINE CHLORIDE 20 MG/ML IJ SOLN
INTRAMUSCULAR | Status: DC | PRN
  Administered 2016-09-07: 80 mg via INTRAVENOUS

## 2016-09-07 MED ORDER — FENTANYL CITRATE (PF) 100 MCG/2ML IJ SOLN: 25.0000 ug | INTRAMUSCULAR | Status: DC | PRN

## 2016-09-07 MED ORDER — PHENOL 1.4 % MT LIQD
1.0000 | OROMUCOSAL | Status: DC | PRN
  Filled 2016-09-07: qty 177

## 2016-09-07 MED ORDER — VANCOMYCIN HCL 500 MG IV SOLR
500.0000 mg | Freq: Once | INTRAVENOUS | Status: DC
  Filled 2016-09-07 (×2): qty 500

## 2016-09-07 MED ORDER — METOPROLOL TARTRATE 25 MG PO TABS
25.0000 mg | ORAL_TABLET | Freq: Two times a day (BID) | ORAL | Status: DC
  Administered 2016-09-08 – 2016-09-09 (×3): 25 mg via ORAL
  Filled 2016-09-07 (×3): qty 1

## 2016-09-07 MED ORDER — SODIUM CHLORIDE 0.9 % IV SOLN
INTRAVENOUS | Status: DC
  Administered 2016-09-07: 14:00:00 via INTRAVENOUS

## 2016-09-07 MED ORDER — SUGAMMADEX SODIUM 200 MG/2ML IV SOLN
INTRAVENOUS | Status: DC | PRN
  Administered 2016-09-07: 128.8 mg via INTRAVENOUS

## 2016-09-07 MED ORDER — MENTHOL 3 MG MT LOZG
1.0000 | LOZENGE | OROMUCOSAL | Status: DC | PRN
  Filled 2016-09-07: qty 9

## 2016-09-07 MED ORDER — SODIUM CHLORIDE 0.9 % IV SOLN
INTRAVENOUS | Status: DC
  Administered 2016-09-07 – 2016-09-08 (×2): via INTRAVENOUS

## 2016-09-07 MED ORDER — VITAMIN E 180 MG (400 UNIT) PO CAPS
400.0000 [IU] | ORAL_CAPSULE | Freq: Every day | ORAL | Status: DC
  Administered 2016-09-08 – 2016-09-09 (×2): 400 [IU] via ORAL
  Filled 2016-09-07 (×2): qty 1

## 2016-09-07 MED ORDER — LISINOPRIL 20 MG PO TABS
20.0000 mg | ORAL_TABLET | Freq: Every day | ORAL | Status: DC
  Administered 2016-09-08 – 2016-09-09 (×2): 20 mg via ORAL
  Filled 2016-09-07 (×2): qty 1

## 2016-09-07 MED ORDER — ROCURONIUM BROMIDE 100 MG/10ML IV SOLN
INTRAVENOUS | Status: DC | PRN
  Administered 2016-09-07: 10 mg via INTRAVENOUS
  Administered 2016-09-07: 20 mg via INTRAVENOUS
  Administered 2016-09-07: 30 mg via INTRAVENOUS

## 2016-09-07 MED ORDER — ONDANSETRON HCL 4 MG/2ML IJ SOLN
4.0000 mg | Freq: Once | INTRAMUSCULAR | Status: DC | PRN
Start: 2016-09-07 — End: 2016-09-07

## 2016-09-07 MED ORDER — LACTATED RINGERS IV SOLN
INTRAVENOUS | Status: DC | PRN
  Administered 2016-09-07: 19:00:00 via INTRAVENOUS

## 2016-09-07 MED ORDER — PHENYLEPHRINE HCL 10 MG/ML IJ SOLN
INTRAMUSCULAR | Status: DC | PRN
  Administered 2016-09-07 (×3): 100 ug via INTRAVENOUS

## 2016-09-07 MED ORDER — KETOROLAC TROMETHAMINE 15 MG/ML IJ SOLN: 15.0000 mg | Freq: Four times a day (QID) | INTRAMUSCULAR | Status: DC | PRN

## 2016-09-07 MED ORDER — ONDANSETRON HCL 4 MG PO TABS: 4.0000 mg | ORAL_TABLET | Freq: Four times a day (QID) | ORAL | Status: DC | PRN

## 2016-09-07 MED ORDER — SENNOSIDES-DOCUSATE SODIUM 8.6-50 MG PO TABS
1.0000 | ORAL_TABLET | Freq: Two times a day (BID) | ORAL | Status: DC
Start: 2016-09-07 — End: 2016-09-09
  Administered 2016-09-07 – 2016-09-09 (×4): 1 via ORAL
  Filled 2016-09-07 (×4): qty 1

## 2016-09-07 MED ORDER — SENNOSIDES-DOCUSATE SODIUM 8.6-50 MG PO TABS: 1.0000 | ORAL_TABLET | Freq: Every evening | ORAL | Status: DC | PRN

## 2016-09-07 MED ORDER — ACETAMINOPHEN 10 MG/ML IV SOLN
1000.0000 mg | Freq: Four times a day (QID) | INTRAVENOUS | Status: AC
  Administered 2016-09-07 – 2016-09-08 (×4): 1000 mg via INTRAVENOUS
  Filled 2016-09-07 (×5): qty 100

## 2016-09-07 MED ORDER — PROPOFOL 10 MG/ML IV BOLUS
INTRAVENOUS | Status: DC | PRN
Start: 2016-09-07 — End: 2016-09-07
  Administered 2016-09-07: 100 mg via INTRAVENOUS

## 2016-09-07 MED ORDER — NEOMYCIN-POLYMYXIN B GU 40-200000 IR SOLN
Status: DC | PRN
  Administered 2016-09-07: 4 mL

## 2016-09-07 MED ORDER — METOCLOPRAMIDE HCL 10 MG PO TABS: 5.0000 mg | ORAL_TABLET | Freq: Three times a day (TID) | ORAL | Status: DC | PRN

## 2016-09-07 MED ORDER — PANTOPRAZOLE SODIUM 40 MG PO TBEC
40.0000 mg | DELAYED_RELEASE_TABLET | Freq: Two times a day (BID) | ORAL | Status: DC
  Administered 2016-09-07 – 2016-09-09 (×4): 40 mg via ORAL
  Filled 2016-09-07 (×4): qty 1

## 2016-09-07 MED ORDER — CEFAZOLIN SODIUM-DEXTROSE 2-4 GM/100ML-% IV SOLN
2.0000 g | INTRAVENOUS | Status: AC
  Administered 2016-09-07: 2 g via INTRAVENOUS
  Filled 2016-09-07: qty 100

## 2016-09-07 MED ORDER — PHENYLEPHRINE HCL 10 MG/ML IJ SOLN: INTRAMUSCULAR | Status: DC | PRN

## 2016-09-07 MED ORDER — FLEET ENEMA 7-19 GM/118ML RE ENEM: 1.0000 | ENEMA | Freq: Once | RECTAL | Status: DC | PRN

## 2016-09-07 MED ORDER — METOCLOPRAMIDE HCL 10 MG PO TABS
10.0000 mg | ORAL_TABLET | Freq: Three times a day (TID) | ORAL | Status: DC
  Administered 2016-09-08 – 2016-09-09 (×6): 10 mg via ORAL
  Filled 2016-09-07 (×6): qty 1

## 2016-09-07 MED ORDER — CEFAZOLIN SODIUM-DEXTROSE 2-4 GM/100ML-% IV SOLN
2.0000 g | Freq: Four times a day (QID) | INTRAVENOUS | Status: AC
  Administered 2016-09-07 – 2016-09-08 (×4): 2 g via INTRAVENOUS
  Filled 2016-09-07 (×4): qty 100

## 2016-09-07 MED ORDER — ASPIRIN EC 81 MG PO TBEC: 81.0000 mg | DELAYED_RELEASE_TABLET | Freq: Every day | ORAL | Status: DC

## 2016-09-07 MED ORDER — MORPHINE SULFATE (PF) 2 MG/ML IV SOLN: 2.0000 mg | INTRAVENOUS | Status: DC | PRN

## 2016-09-07 MED ORDER — ASPIRIN EC 325 MG PO TBEC
325.0000 mg | DELAYED_RELEASE_TABLET | Freq: Every day | ORAL | Status: DC
  Administered 2016-09-08 – 2016-09-09 (×2): 325 mg via ORAL
  Filled 2016-09-07 (×2): qty 1

## 2016-09-07 MED ORDER — BISACODYL 10 MG RE SUPP: 10.0000 mg | Freq: Every day | RECTAL | Status: DC | PRN

## 2016-09-07 MED ORDER — FERROUS SULFATE 325 (65 FE) MG PO TABS
325.0000 mg | ORAL_TABLET | Freq: Two times a day (BID) | ORAL | Status: DC
  Administered 2016-09-08 – 2016-09-09 (×3): 325 mg via ORAL
  Filled 2016-09-07 (×3): qty 1

## 2016-09-07 MED ORDER — FENTANYL CITRATE (PF) 100 MCG/2ML IJ SOLN
INTRAMUSCULAR | Status: DC | PRN
Start: 2016-09-07 — End: 2016-09-07
  Administered 2016-09-07 (×3): 25 ug via INTRAVENOUS
  Administered 2016-09-07: 50 ug via INTRAVENOUS
  Administered 2016-09-07: 25 ug via INTRAVENOUS

## 2016-09-07 MED ORDER — OXYCODONE HCL 5 MG PO TABS: 5.0000 mg | ORAL_TABLET | ORAL | Status: DC | PRN

## 2016-09-07 MED ORDER — BUPIVACAINE HCL 0.25 % IJ SOLN
INTRAMUSCULAR | Status: DC | PRN
  Administered 2016-09-07: 10 mL

## 2016-09-07 MED ORDER — EPHEDRINE SULFATE 50 MG/ML IJ SOLN
INTRAMUSCULAR | Status: DC | PRN
  Administered 2016-09-07: 10 mg via INTRAVENOUS

## 2016-09-07 MED ORDER — ONDANSETRON HCL 4 MG/2ML IJ SOLN
INTRAMUSCULAR | Status: DC | PRN
  Administered 2016-09-07: 4 mg via INTRAVENOUS

## 2016-09-07 MED ORDER — ACETAMINOPHEN 325 MG PO TABS
650.0000 mg | ORAL_TABLET | Freq: Four times a day (QID) | ORAL | Status: DC | PRN
  Administered 2016-09-09: 650 mg via ORAL
  Filled 2016-09-07: qty 2

## 2016-09-07 MED ORDER — VANCOMYCIN HCL 1000 MG IV SOLR
INTRAVENOUS | Status: DC | PRN
  Administered 2016-09-07: 1000 mg via INTRAVENOUS

## 2016-09-07 MED ORDER — METOCLOPRAMIDE HCL 5 MG/ML IJ SOLN: 5.0000 mg | Freq: Three times a day (TID) | INTRAMUSCULAR | Status: DC | PRN

## 2016-09-07 MED ORDER — ACETAMINOPHEN 325 MG PO TABS
650.0000 mg | ORAL_TABLET | Freq: Four times a day (QID) | ORAL | Status: DC | PRN
  Administered 2016-09-07: 650 mg via ORAL
  Filled 2016-09-07: qty 2

## 2016-09-07 MED ORDER — CALCIUM CARBONATE-VITAMIN D 500-200 MG-UNIT PO TABS
1.0000 | ORAL_TABLET | Freq: Every day | ORAL | Status: DC
  Administered 2016-09-08 – 2016-09-09 (×2): 1 via ORAL
  Filled 2016-09-07 (×2): qty 1

## 2016-09-07 SURGICAL SUPPLY — 48 items
BANDAGE ACE 4X5 VEL STRL LF (GAUZE/BANDAGES/DRESSINGS) ×6 IMPLANT
BIT DRILL 2.5X110 QC LCP DISP (BIT) ×3 IMPLANT
BIT DRILL CANN 2.7X625 NONSTRL (BIT) ×3 IMPLANT
BNDG COHESIVE 4X5 TAN STRL (GAUZE/BANDAGES/DRESSINGS) ×3 IMPLANT
BNDG ESMARK 6X12 TAN STRL LF (GAUZE/BANDAGES/DRESSINGS) ×3 IMPLANT
COOLER POLAR GLACIER W/PUMP (MISCELLANEOUS) ×3 IMPLANT
CUFF TOURN 24 STER (MISCELLANEOUS) ×3 IMPLANT
CUFF TOURN 30 STER DUAL PORT (MISCELLANEOUS) IMPLANT
DRAPE FLUOR MINI C-ARM 54X84 (DRAPES) ×3 IMPLANT
DRSG DERMACEA 8X12 NADH (GAUZE/BANDAGES/DRESSINGS) ×3 IMPLANT
DURAPREP 26ML APPLICATOR (WOUND CARE) ×3 IMPLANT
ELECT CAUTERY BLADE 6.4 (BLADE) ×3 IMPLANT
ELECT REM PT RETURN 9FT ADLT (ELECTROSURGICAL) ×3
ELECTRODE REM PT RTRN 9FT ADLT (ELECTROSURGICAL) ×1 IMPLANT
GAUZE SPONGE 4X4 12PLY STRL (GAUZE/BANDAGES/DRESSINGS) ×3 IMPLANT
GLOVE BIOGEL M STRL SZ7.5 (GLOVE) ×15 IMPLANT
GOWN STRL REUS W/ TWL LRG LVL3 (GOWN DISPOSABLE) ×3 IMPLANT
GOWN STRL REUS W/TWL LRG LVL3 (GOWN DISPOSABLE) ×6
GUIDEWIRE THREADED 150MM (WIRE) ×6 IMPLANT
KIT RM TURNOVER STRD PROC AR (KITS) ×3 IMPLANT
LABEL OR SOLS (LABEL) ×3 IMPLANT
PACK EXTREMITY ARMC (MISCELLANEOUS) ×3 IMPLANT
PAD ABD DERMACEA PRESS 5X9 (GAUZE/BANDAGES/DRESSINGS) ×9 IMPLANT
PAD CAST CTTN 4X4 STRL (SOFTGOODS) ×3 IMPLANT
PAD PREP 24X41 OB/GYN DISP (PERSONAL CARE ITEMS) ×3 IMPLANT
PAD WRAPON POLAR ANKLE (MISCELLANEOUS) ×2 IMPLANT
PADDING CAST COTTON 4X4 STRL (SOFTGOODS) ×6
PLATE LCP 3.5 1/3 TUB 7HX81 (Plate) ×3 IMPLANT
SCREW CANC FT ST SFS 4X14 (Screw) ×3 IMPLANT
SCREW CORTEX 3.5 12MM (Screw) ×4 IMPLANT
SCREW CORTEX 3.5 14MM (Screw) ×2 IMPLANT
SCREW CORTEX 3.5 16MM (Screw) ×2 IMPLANT
SCREW CORTEX 3.5 18MM (Screw) ×2 IMPLANT
SCREW CORTEX 3.5 20MM (Screw) ×2 IMPLANT
SCREW LOCK CORT ST 3.5X12 (Screw) ×2 IMPLANT
SCREW LOCK CORT ST 3.5X14 (Screw) ×1 IMPLANT
SCREW LOCK CORT ST 3.5X16 (Screw) ×1 IMPLANT
SCREW LOCK CORT ST 3.5X18 (Screw) ×1 IMPLANT
SCREW LOCK CORT ST 3.5X20 (Screw) ×1 IMPLANT
SCREW SHORT THREAD 4.0X40 (Screw) ×6 IMPLANT
SPLINT CAST 1 STEP 5X30 WHT (MISCELLANEOUS) ×3 IMPLANT
SPONGE LAP 18X18 5 PK (GAUZE/BANDAGES/DRESSINGS) ×6 IMPLANT
STAPLER SKIN PROX 35W (STAPLE) ×3 IMPLANT
STOCKINETTE STRL 6IN 960660 (GAUZE/BANDAGES/DRESSINGS) ×3 IMPLANT
SUT VIC AB 0 CT1 36 (SUTURE) ×6 IMPLANT
SUT VIC AB 2-0 SH 27 (SUTURE) ×2
SUT VIC AB 2-0 SH 27XBRD (SUTURE) ×1 IMPLANT
WRAPON POLAR PAD ANKLE (MISCELLANEOUS) ×6

## 2016-09-07 NOTE — Anesthesia Procedure Notes (Signed)
Date/Time: 09/07/2016 7:19 PM Performed by: Waldo LaineJUSTIS, Gleason Ardoin Pre-anesthesia Checklist: Patient identified, Emergency Drugs available, Suction available, Patient being monitored and Timeout performed Patient Re-evaluated:Patient Re-evaluated prior to inductionOxygen Delivery Method: Circle system utilized Preoxygenation: Pre-oxygenation with 100% oxygen Intubation Type: IV induction Ventilation: Mask ventilation without difficulty Laryngoscope Size: Miller and 2 Grade View: Grade I Number of attempts: 1 Airway Equipment and Method: Stylet Placement Confirmation: ETT inserted through vocal cords under direct vision,  positive ETCO2 and breath sounds checked- equal and bilateral Secured at: 21 cm Tube secured with: Tape Dental Injury: Teeth and Oropharynx as per pre-operative assessment

## 2016-09-07 NOTE — H&P (Signed)
[facility] [place] [facility]   PATIENT NAME: Brandi Scott    MR#:  [id number]  DATE OF BIRTH:  1928/10/31  DATE OF ADMISSION:  09/07/2016  PRIMARY CARE PHYSICIAN: Jerl MinaJames Hedrick, MD   REQUESTING/REFERRING PHYSICIAN: Dr. Scotty CourtStafford  CHIEF COMPLAINT:   Left ankle pain HISTORY OF PRESENT ILLNESS:  Brandi BeersFlorence Angeletti  is a 82 y.o. female with a known history of Essential hypertension who presents after mechanical fall. Patient reports she was walking out on her door when she slipped on a mat which she believes had ice under the her door mat. EMS was called by her neighbors and she presents to the emergency room. X-ray does show fractures of the medial malleolus and distal fibula with ankle mortise disruption.  PAST MEDICAL HISTORY:   Past Medical History:  Diagnosis Date  . Chronic radicular low back pain   . Essential hypertension   . 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 Hyperlipidemia   . Osteoarthritis of both hips    And knees as well as back.   . Valvular heart disease    2013 Echo: Moderate MR and moderate-severe TR on echocardiogram; Recheck Echo 2015: mild MR & TR    PAST SURGICAL HISTORY:   Past Surgical History:  Procedure Laterality Date  . BREAST EXCISIONAL BIOPSY Right 1971, 1981   negative  . COLONOSCOPY    . Holter Monitor  May 2013   [facility] Cardiology: Occasional PVCs (benign)  . NM MYOVIEW LTD  May 2013; Dec 2015   @ [facility] Card[facility]: a) 2013: Exercise for 2:30 min (only), hypertensive response no ischemic changes. He has 74%, normal wall motion, no evidence of ischemia or infarction.; b) Ex 3:00 min (dyspnea) 3.3 METs HR 146 (106% MPHR) - no ECG changes, E 70%, no RWMA, No ischemia or infarction  . TONGUE BIOPSY    . TONSILLECTOMY    . TOTAL ABDOMINAL HYSTERECTOMY W/ BILATERAL SALPINGOOPHORECTOMY    . TRANSTHORACIC ECHOCARDIOGRAM  May 2013; May 2015   @ [facility]  Cardiology: a) 2013 : Normal LV function, EF 65%, mild BiAE, Mild LVH, Mod MR, Mod-Severe TR; b) normal LV size, EF> 55%, mild LVH, mild MR, mild TR and    SOCIAL HISTORY:   Social History  Substance Use Topics  . Smoking status: Never Smoker  . Smokeless tobacco: Never Used  . Alcohol use No    FAMILY HISTORY:   Family History  Problem Relation Age of Onset  . Alzheimer's disease Mother   . Heart failure Mother   . Hypertension Father   . Heart attack Father   . Heart disease Brother   . Stomach cancer Brother   . Heart disease Maternal Grandmother   . Breast cancer Neg Hx     DRUG ALLERGIES:  No Known Allergies  REVIEW OF SYSTEMS:   Review of Systems  Constitutional: Negative.  Negative for chills, fever and malaise/fatigue.  HENT: Negative.  Negative for ear discharge, ear pain, hearing loss, nosebleeds and sore throat.   Eyes: Negative.  Negative for blurred vision and pain.  Respiratory: Negative.  Negative for cough, hemoptysis, shortness of breath and wheezing.   Cardiovascular: Negative.  Negative for chest pain, palpitations and leg swelling.  Gastrointestinal: Negative.  Negative for abdominal pain, blood in stool, diarrhea, nausea and vomiting.  Genitourinary: Negative.  Negative for dysuria.  Musculoskeletal: Positive for joint pain. Negative for back pain.  Skin: Negative.   Neurological: Negative for dizziness, tremors, speech change, focal weakness, seizures and headaches.  Endo/Heme/Allergies: Negative.  Does not bruise/bleed easily.  Psychiatric/Behavioral: Negative.  Negative for depression, hallucinations and suicidal ideas.    MEDICATIONS AT HOME:   Prior to Admission medications   Medication Sig Start Date End Date Taking? Authorizing Provider  aspirin EC 81 MG tablet Take 81 mg by mouth at bedtime.   Yes Historical Provider, MD  Calcium Carb-Cholecalciferol (CALCIUM 600+D) 600-800 MG-UNIT TABS Take 1 tablet by mouth daily.   Yes Historical  Provider, MD  lisinopril (PRINIVIL,ZESTRIL) 20 MG tablet Take 20 mg by mouth daily with lunch.    Yes Historical Provider, MD  metoprolol tartrate (LOPRESSOR) 25 MG tablet Take 25 mg by mouth 2 (two) times daily.   Yes Historical Provider, MD  vitamin E 400 UNIT capsule Take 400 Units by mouth daily.   Yes Historical Provider, MD      VITAL SIGNS:  Blood pressure (!) 145/84, temperature 98.1 F (36.7 C), temperature source Oral, resp. rate 16, height 5\' 7"  (1.702 m), weight 64.4 kg (142 lb), SpO2 96 %.  PHYSICAL EXAMINATION:   Physical Exam  Constitutional: She is oriented to person, place, and time and well-developed, well-nourished, and in no distress. No distress.  HENT:  Head: Normocephalic.  Eyes: No scleral icterus.  Neck: Normal range of motion. Neck supple. No JVD present. No tracheal deviation present.  Cardiovascular: Normal rate and regular rhythm.  Exam reveals no gallop and no friction rub.   Murmur heard. Pulmonary/Chest: Effort normal and breath sounds normal. No respiratory distress. She has no wheezes. She has no rales. She exhibits no tenderness.  Abdominal: Soft. Bowel sounds are normal. She exhibits no distension and no mass. There is no tenderness. There is no rebound and no guarding.  Musculoskeletal: She exhibits edema.  Left ankle exhibits edema and decreased range of motion due to fracture. Right foot with hammertoe  Neurological: She is alert and oriented to person, place, and time.  Skin: Skin is warm. No rash noted. No erythema.  Psychiatric: Affect and judgment normal.      LABORATORY PANEL:   CBC  Recent Labs Lab 09/07/16 1130  WBC 11.6*  HGB 14.1  HCT 42.3  PLT 240   ------------------------------------------------------------------------------------------------------------------  Chemistries  No results for input(s): NA, K, CL, CO2, GLUCOSE, BUN, CREATININE, CALCIUM, MG, AST, ALT, ALKPHOS, BILITOT in the last 168 hours.  Invalid  input(s): GFRCGP ------------------------------------------------------------------------------------------------------------------  Cardiac Enzymes No results for input(s): TROPONINI in the last 168 hours. ------------------------------------------------------------------------------------------------------------------  RADIOLOGY:  Dg Ankle Complete Left  Result Date: 09/07/2016 CLINICAL DATA:  Pain after fall EXAM: LEFT ANKLE COMPLETE - 3+ VIEW COMPARISON:  None. FINDINGS: Frontal, oblique, and lateral views were obtained. There is diffuse soft tissue swelling. There is a transversely oriented fracture of the medial malleolus with lateral displacement of the medial malleolus compared to the remainder the tibia. There is an obliquely oriented fracture of the distal fibular diaphysis with lateral displacement distally. There is ankle mortise disruption. There is a spur arising from the inferior calcaneus. There is no appreciable joint space narrowing. IMPRESSION: Fractures of the medial malleolus and distal fibula with ankle mortise disruption. Diffuse soft tissue swelling. Electronically Signed   By: Bretta BangWilliam  Woodruff III M.D.   On: 09/07/2016 11:05    EKG:   NSR with t wave in in leads IMPRESSION AND PLAN:   82 year old female with history of ventricular bigeminy who presents after mechanical  fall and unfortunately has suffered left ankle fracture.  1. Closed left ankle fracture, initial Encounter: Orthopedic surgery has been contacted by ED physician. Patient is low risk for moderate risk procedure and may proceed without further cardiac workup or surgery. Continue pain management She will need physical therapy and case management consultation after surgery.  2. Essential hypertension: Continue lisinopril and metoprolol yet 3. 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 Management plans  discussed with the patient and she in agreement  CODE STATUS: Full  TOTAL TIME TAKING CARE OF THIS PATIENT: 45 minutes.    Gurinder Toral M.D on 09/07/2016 at 12:02 PM  Between 7am to 6pm - Pager - [phone]  After 6pm go to [email or web address] - Social research officer, government  [facility]  Office  [phone]  CC: Primary care physician; [name], MD

## 2016-09-07 NOTE — Transfer of Care (Signed)
Immediate Anesthesia Transfer of Care Note  Patient: Brandi Scott  Procedure(s) Performed: Procedure(s): OPEN REDUCTION INTERNAL FIXATION (ORIF) ANKLE FRACTURE (Left)  Patient Location: PACU  Anesthesia Type:General  Level of Consciousness: awake, alert  and patient cooperative  Airway & Oxygen Therapy: Patient Spontanous Breathing and Patient connected to face mask oxygen  Post-op Assessment: Report given to RN and Post -op Vital signs reviewed and stable  Post vital signs: Reviewed and stable  Last Vitals:  Vitals:   09/07/16 1238 09/07/16 1625  BP: 138/80 (!) 144/85  Pulse: 78 97  Resp: 18 18  Temp:  36.6 C    Last Pain:  Vitals:   09/07/16 1700  TempSrc:   PainSc: 5          Complications: No apparent anesthesia complications

## 2016-09-07 NOTE — ED Triage Notes (Signed)
Slipped on ice  Injury to left ankle    Was able to get up by herself

## 2016-09-07 NOTE — Consult Note (Addendum)
ORTHOPAEDIC CONSULTATION  PATIENT NAME: Brandi Scott DOB: 1929/04/28  MRN: [id number]  REQUESTING PHYSICIAN: Adrian SaranSital Mody, MD  Chief Complaint: Left ankle pain  HPI: Brandi Scott is a 82 y.o. female who complains of  left ankle pain. She slipped on a mat at home and fell, causing the left ankle twisted under her. She had the immediate onset of ankle pain and had difficulty with weightbearing due to the pain. She denied any loss of consciousness. She denied any other injuries.  Past Medical History:  Diagnosis Date  . Chronic radicular low back pain   . Essential hypertension   . 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 Hyperlipidemia   . Osteoarthritis of both hips    And knees as well as back.   . Valvular heart disease    2013 Echo: Moderate MR and moderate-severe TR on echocardiogram; Recheck Echo 2015: mild MR & TR   Past Surgical History:  Procedure Laterality Date  . BREAST EXCISIONAL BIOPSY Right 1971, 1981   negative  . COLONOSCOPY    . Holter Monitor  May 2013   [facility] Cardiology: Occasional PVCs (benign)  . NM MYOVIEW LTD  May 2013; Dec 2015   @ [facility] Card[facility]: a) 2013: Exercise for 2:30 min (only), hypertensive response no ischemic changes. He has 74%, normal wall motion, no evidence of ischemia or infarction.; b) Ex 3:00 min (dyspnea) 3.3 METs HR 146 (106% MPHR) - no ECG changes, E 70%, no RWMA, No ischemia or infarction  . TONGUE BIOPSY    . TONSILLECTOMY    . TOTAL ABDOMINAL HYSTERECTOMY W/ BILATERAL SALPINGOOPHORECTOMY    . TRANSTHORACIC ECHOCARDIOGRAM  May 2013; May 2015   @ [facility]: a) 2013 : Normal LV function, EF 65%, mild BiAE, Mild LVH, Mod MR, Mod-Severe TR; b) normal LV size, EF> 55%, mild LVH, mild MR, mild TR and   Social History   Social History  . Marital status: Married    Spouse name: N/A  . Number of children: N/A  . Years of education: N/A   Social  History Main Topics  . Smoking status: Never Smoker  . Smokeless tobacco: Never Used  . Alcohol use No  . Drug use: No  . Sexual activity: Not Asked   Other Topics Concern  . None   Social History Narrative  . None   Family History  Problem Relation Age of Onset  . Alzheimer's disease Mother   . Heart failure Mother   . Hypertension Father   . Heart attack Father   . Heart disease Brother   . Stomach cancer Brother   . Heart disease Maternal Grandmother   . Breast cancer Neg Hx    No Known Allergies Prior to Admission medications   Medication Sig Start Date End Date Taking? Authorizing Provider  aspirin EC 81 MG tablet Take 81 mg by mouth at bedtime.   Yes Historical Provider, MD  Calcium Carb-Cholecalciferol (CALCIUM 600+D) 600-800 MG-UNIT TABS Take 1 tablet by mouth daily.   Yes Historical Provider, MD  lisinopril (PRINIVIL,ZESTRIL) 20 MG tablet Take 20 mg by mouth daily with lunch.    Yes Historical Provider, MD  metoprolol tartrate (LOPRESSOR) 25 MG tablet Take 25 mg by mouth 2 (two) times daily.   Yes Historical Provider, MD  vitamin E 400 UNIT capsule Take 400 Units by mouth daily.   Yes Historical Provider, MD   Dg Chest  2 View  Result Date: 09/07/2016 CLINICAL DATA:  Preoperative evaluation for ankle surgery EXAM: CHEST  2 VIEW COMPARISON:  January 13, 2014 FINDINGS: There is mild bibasilar scarring. There is no edema or consolidation. The heart size and pulmonary vascularity are normal. There is atherosclerotic calcification in the aorta. There is thoracolumbar levoscoliosis. IMPRESSION: Mild bibasilar scarring. No edema or consolidation. Aortic atherosclerosis. Electronically Signed   By: Bretta BangWilliam  Woodruff III M.D.   On: 09/07/2016 12:10   Dg Ankle Complete Left  Result Date: 09/07/2016 CLINICAL DATA:  Pain after fall EXAM: LEFT ANKLE COMPLETE - 3+ VIEW COMPARISON:  None. FINDINGS: Frontal, oblique, and lateral views were obtained. There is diffuse soft tissue  swelling. There is a transversely oriented fracture of the medial malleolus with lateral displacement of the medial malleolus compared to the remainder the tibia. There is an obliquely oriented fracture of the distal fibular diaphysis with lateral displacement distally. There is ankle mortise disruption. There is a spur arising from the inferior calcaneus. There is no appreciable joint space narrowing. IMPRESSION: Fractures of the medial malleolus and distal fibula with ankle mortise disruption. Diffuse soft tissue swelling. Electronically Signed   By: Bretta BangWilliam  Woodruff III M.D.   On: 09/07/2016 11:05    Positive ROS: All other systems have been reviewed and were otherwise negative with the exception of those mentioned in the HPI and as above.  Physical Exam: General: Alert and alert in no acute distress. HEENT: Atraumatic and normocephalic. Sclera are clear. Extraocular motion is intact. Oropharynx is clear with moist mucosa. Neck: Supple, nontender, good range of motion. No JVD or carotid bruits. Lungs: Clear to auscultation bilaterally. Cardiovascular: Regular rate and rhythm with normal S1 and S2. 2/6 soft murmur. No gallops or rubs. Pedal pulses are palpable bilaterally. Homans test is negative bilaterally. No significant pretibial or ankle edema. Abdomen: Soft, nontender, and nondistended. Bowel sounds are present. Skin: No lesions in the area of chief complaint Neurologic: Awake, alert, and oriented. Sensory function is grossly intact. Motor strength is felt to be 5 over 5 bilaterally. No clonus or tremor. Good motor coordination. Lymphatic: No axillary or cervical lymphadenopathy  MUSCULOSKELETAL: Examination of the upper extremities demonstrates good range of motion, strength, and stability. Examination of the right lower extremity is unremarkable. Examination of the left lower extremity shows a posterior splint to be intact. There is tenderness to palpation both the medial and lateral  aspect of the ankle. Moderate soft tissue swelling is noted. Hammertoe deformity is present. No gross tenderness to palpation about the knee. No knee effusion. No pain is elicited with range of motion of the hip.  Assessment: Left bimalleolar ankle fracture  Plan: The findings were discussed in detail with the patient and her nephew. Recommendation is made for open reduction and internal fixation of the bimalleolar ankle fracture. The usual perioperative course was discussed. The risks and benefits of surgical intervention were reviewed. The patient expressed understanding of the risks and benefits and agreed with plans for surgical intervention.   The surgical site was signed as per the "right site surgery" protocol.   James P. Angie FavaHooten, Jr. M.D.

## 2016-09-07 NOTE — ED Provider Notes (Signed)
[facility] Emergency Department Provider Note   ____________________________________________   None    (approximate)  I have reviewed the triage vital signs and the nursing notes.   HISTORY  Chief Complaint Fall    HPI Brandi Scott is a 82 y.o. female  patient complaining of left ankle pain secondary to a slip and fall. Patient states she slipped on ice this morning. Patient arrived via EMS secondary to unable to bear weight on the affected extremity.Patient rated her pain as a 9/10. Patient described a pain as "achy". No palliative measures for this request. Patient declined pain medication at this time.   Past Medical History:  Diagnosis Date  . Chronic radicular low back pain   . Essential hypertension   . 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 Hyperlipidemia   . Osteoarthritis of both hips    And knees as well as back.   . Valvular heart disease    2013 Echo: Moderate MR and moderate-severe TR on echocardiogram; Recheck Echo 2015: mild MR & TR    Patient Active Problem List   Diagnosis Date Noted  . Tachycardia palpitations 12/14/2014  . Frequent PVCs 12/14/2014  . Asymptomatic varicose veins of bilateral lower extremities: With mild edema 12/14/2014  . Essential hypertension 12/14/2014  . Mild mitral and tricuspid regurgitation on recent echo     Past Surgical History:  Procedure Laterality Date  . BREAST EXCISIONAL BIOPSY Right 1971, 1981   negative  . COLONOSCOPY    . Holter Monitor  May 2013   @ [facility] Cardiology: Occasional PVCs (benign)  . NM MYOVIEW LTD  May 2013; Dec 2015   [facility]: a) 2013: Exercise for 2:30 min (only), hypertensive response no ischemic changes. He has 74%, normal wall motion, no evidence of ischemia or infarction.; b) Ex 3:00 min (dyspnea) 3.3 METs HR 146 (106% MPHR) - no ECG changes, E 70%, no RWMA, No ischemia or infarction  .  TONGUE BIOPSY    . TONSILLECTOMY    . TOTAL ABDOMINAL HYSTERECTOMY W/ BILATERAL SALPINGOOPHORECTOMY    . TRANSTHORACIC ECHOCARDIOGRAM  May 2013; May 2015   [facility]: a) 2013 : Normal LV function, EF 65%, mild BiAE, Mild LVH, Mod MR, Mod-Severe TR; b) normal LV size, EF> 55%, mild LVH, mild MR, mild TR and    Prior to Admission medications   Medication Sig Start Date End Date Taking? Authorizing Provider  aspirin EC 81 MG tablet Take 81 mg by mouth at bedtime.   Yes Historical Provider, MD  Calcium Carb-Cholecalciferol (CALCIUM 600+D) 600-800 MG-UNIT TABS Take 1 tablet by mouth daily.   Yes Historical Provider, MD  lisinopril (PRINIVIL,ZESTRIL) 20 MG tablet Take 20 mg by mouth daily with lunch.    Yes Historical Provider, MD  metoprolol tartrate (LOPRESSOR) 25 MG tablet Take 25 mg by mouth 2 (two) times daily.   Yes Historical Provider, MD  vitamin E 400 UNIT capsule Take 400 Units by mouth daily.   Yes Historical Provider, MD    Allergies Patient has no known allergies.  Family History  Problem Relation Age of Onset  . Alzheimer's disease Mother   . Heart failure Mother   . Hypertension Father   . Heart attack Father   . Heart disease Brother   . Stomach cancer Brother   . Heart disease Maternal Grandmother   . Breast cancer Neg Hx  Social History Social History  Substance Use Topics  . Smoking status: Never Smoker  . Smokeless tobacco: Never Used  . Alcohol use No    Review of Systems Constitutional: No fever/chills Eyes: No visual changes. ENT: No sore throat. Cardiovascular: Denies chest pain. Respiratory: Denies shortness of breath. Gastrointestinal: No abdominal pain.  No nausea, no vomiting.  No diarrhea.  No constipation. Genitourinary: Negative for dysuria. Musculoskeletal: Left lateral ankle pain  Skin: Negative for rash. Neurological: Negative for headaches, focal weakness or numbness. Endocrine:Hypertension and  hyperlipidemia.  ____________________________________________   PHYSICAL EXAM:  VITAL SIGNS: ED Triage Vitals  Enc Vitals Group     BP 09/07/16 1027 (!) 145/84     Pulse --      Resp 09/07/16 1027 16     Temp 09/07/16 1027 98.1 F (36.7 C)     Temp Source 09/07/16 1027 Oral     SpO2 09/07/16 1027 96 %     Weight 09/07/16 1027 142 lb (64.4 kg)     Height 09/07/16 1027 5\' 7"  (1.702 m)     Head Circumference --      Peak Flow --      Pain Score 09/07/16 1022 4     Pain Loc --      Pain Edu? --      Excl. in GC? --     Constitutional: Alert and oriented. Well appearing and in no acute distress. Eyes: Conjunctivae are normal. PERRL. EOMI. Head: Atraumatic. Nose: No congestion/rhinnorhea. Mouth/Throat: Mucous membranes are moist.  Oropharynx non-erythematous. Neck: No stridor.  No cervical spine tenderness to palpation. Hematological/Lymphatic/Immunilogical: No cervical lymphadenopathy. Cardiovascular: Normal rate, regular rhythm. Grossly normal heart sounds.  Good peripheral circulation. Respiratory: Normal respiratory effort.  No retractions. Lungs CTAB. Gastrointestinal: Soft and nontender. No distention. No abdominal bruits. No CVA tenderness. Musculoskeletal:No obvious deformity to the ankle. Moderate edema to the lateral aspect of the left ankle. Patient moderate guarding palpation bilateral malleolus. Neurologic:  Normal speech and language. No gross focal neurologic deficits are appreciated. No gait instability. Skin:  Skin is warm, dry and intact. No rash noted. Psychiatric: Mood and affect are normal. Speech and behavior are normal.  ____________________________________________   LABS (all labs ordered are listed, but only abnormal results are displayed)  Labs Reviewed  BASIC METABOLIC PANEL  CBC WITH DIFFERENTIAL/PLATELET   ____________________________________________  EKG  EKG read by heart station  Dr. ____________________________________________  RADIOLOGY  Fracture of the medial malleolus and distal fibula with ankle mortise disruption. ____________________________________________   PROCEDURES  Procedure(s) performed: None  Procedures  Critical Care performed: No  ____________________________________________   INITIAL IMPRESSION / ASSESSMENT AND PLAN / ED COURSE  Pertinent labs & imaging results that were available during my care of the patient were reviewed by me and considered in my medical decision making (see chart for details).  Bilateral malleolus fracture left ankle. Skin  Clinical Course    Discussed x-ray finding with patient of Dr. Ernest PineHooten. Patient will be admitted under medicine for consult orthopedic surgery.  ____________________________________________   FINAL CLINICAL IMPRESSION(S) / ED DIAGNOSES  Final diagnoses:  Closed left ankle fracture, initial encounter      NEW MEDICATIONS STARTED DURING THIS VISIT:  New Prescriptions   No medications on file     Note:  This document was prepared using Dragon voice recognition software and may include unintentional dictation errors.    Joni ReiningRonald K Soniya Ashraf, PA-C 09/07/16 1140    Sharman CheekPhillip Stafford, MD 09/08/16 [phone]

## 2016-09-07 NOTE — Anesthesia Preprocedure Evaluation (Signed)
Anesthesia Evaluation  Patient identified by MRN, date of birth, ID band Patient awake    Reviewed: Allergy & Precautions, H&P , NPO status , Patient's Chart, lab work & pertinent test results, reviewed documented beta blocker date and time   History of Anesthesia Complications Negative for: history of anesthetic complications  Airway Mallampati: II  TM Distance: >3 FB Neck ROM: full    Dental  (+) Caps, Missing   Pulmonary neg pulmonary ROS,    Pulmonary exam normal breath sounds clear to auscultation       Cardiovascular Exercise Tolerance: Good hypertension, (-) angina(-) CAD, (-) Past MI, (-) Cardiac Stents and (-) CABG Normal cardiovascular exam+ dysrhythmias + Valvular Problems/Murmurs  Rhythm:regular Rate:Normal     Neuro/Psych negative neurological ROS  negative psych ROS   GI/Hepatic Neg liver ROS, GERD  ,  Endo/Other  negative endocrine ROS  Renal/GU negative Renal ROS  negative genitourinary   Musculoskeletal   Abdominal   Peds  Hematology negative hematology ROS (+)   Anesthesia Other Findings Past Medical History: No date: Chronic radicular low back pain No date: Essential hypertension May 2013: Frequent PVCs     Comment: Ventricular bigeminy (I49.9) -- noted on               Holter monitor No date: Hyperlipidemia No date: Osteoarthritis of both hips     Comment: And knees as well as back.  No date: Valvular heart disease     Comment: 2013 Echo: Moderate MR and moderate-severe TR               on echocardiogram; Recheck Echo 2015: mild MR &              TR   Reproductive/Obstetrics negative OB ROS                             Anesthesia Physical Anesthesia Plan  ASA: II  Anesthesia Plan: General   Post-op Pain Management:    Induction:   Airway Management Planned:   Additional Equipment:   Intra-op Plan:   Post-operative Plan:   Informed Consent: I have  reviewed the patients History and Physical, chart, labs and discussed the procedure including the risks, benefits and alternatives for the proposed anesthesia with the patient or authorized representative who has indicated his/her understanding and acceptance.   Dental Advisory Given  Plan Discussed with: Anesthesiologist, CRNA and Surgeon  Anesthesia Plan Comments:         Anesthesia Quick Evaluation

## 2016-09-07 NOTE — [place] FL2 (Signed)
[place] MEDICAID FL2 LEVEL OF CARE SCREENING TOOL     IDENTIFICATION  Patient Name: Brandi Scott Birthdate: [birth year] Sex: female Admission Date (Current Location): 09/07/2016  [place]   Facility and Address:  [facility], [address], [place], [place] [address]      Provider Number: [id number]  Attending Physician Name and Address:  Adrian SaranSital Mody, MD  Relative Name and Phone Number:       Current Level of Care: Hospital Recommended Level of Care: Skilled Nursing Facility Prior Approval Number:    Date Approved/Denied:   PASRR Number:  [id number])  Discharge Plan: SNF    Current Diagnoses: Patient Active Problem List   Diagnosis Date Noted  . Ankle fracture, bimalleolar, closed, left, initial encounter 09/07/2016  . Tachycardia palpitations 12/14/2014  . Frequent PVCs 12/14/2014  . Asymptomatic varicose veins of bilateral lower extremities: With mild edema 12/14/2014  . Essential hypertension 12/14/2014  . Mild mitral and tricuspid regurgitation on recent echo     Orientation RESPIRATION BLADDER Height & Weight     Self, Time, Situation, Place  Normal Continent Weight: 142 lb (64.4 kg) Height:  5\' 7"  (170.2 cm)  BEHAVIORAL SYMPTOMS/MOOD NEUROLOGICAL BOWEL NUTRITION STATUS   (none)  (none) Continent Diet (NPO for surgery )  AMBULATORY STATUS COMMUNICATION OF NEEDS Skin   Extensive Assist Verbally Normal                       Personal Care Assistance Level of Assistance  Bathing, Feeding, Dressing Bathing Assistance: Limited assistance Feeding assistance: Independent Dressing Assistance: Limited assistance     Functional Limitations Info  Sight, Hearing, Speech Sight Info: Adequate Hearing Info: Adequate Speech Info: Adequate    SPECIAL CARE FACTORS FREQUENCY  PT (By licensed PT), OT (By licensed OT)     PT Frequency:  (5) OT Frequency:  (5)            Contractures       Additional Factors Info  Code Status, Allergies, Isolation Precautions Code Status Info:  (Full Code. ) Allergies Info:  (No Known Allergies. )           Current Medications (09/07/2016):  This is the current hospital active medication list Current Facility-Administered Medications  Medication Dose Route Frequency Provider Last Rate Last Dose  . 0.9 %  sodium chloride infusion   Intravenous Continuous Adrian SaranSital Mody, MD 75 mL/hr at 09/07/16 1358    . acetaminophen (TYLENOL) tablet 650 mg  650 mg Oral Q6H PRN Adrian SaranSital Mody, MD       Or  . acetaminophen (TYLENOL) suppository 650 mg  650 mg Rectal Q6H PRN Adrian SaranSital Mody, MD      . aspirin EC tablet 81 mg  81 mg Oral QHS Adrian SaranSital Mody, MD      . Melene Muller[START ON 09/08/2016] calcium-vitamin D (OSCAL WITH D) 500-200 MG-UNIT per tablet 1 tablet  1 tablet Oral Daily Sital Mody, MD      . ceFAZolin (ANCEF) IVPB 2g/100 mL premix  2 g Intravenous To OR Donato HeinzJames P Hooten, MD      . HYDROcodone-acetaminophen (NORCO/VICODIN) 5-325 MG per tablet 1-2 tablet  1-2 tablet Oral Q4H PRN Adrian SaranSital Mody, MD      . ketorolac (TORADOL) 15 MG/ML injection 15 mg  15 mg Intravenous Q6H PRN Sital Mody, MD      . lisinopril (PRINIVIL,ZESTRIL) tablet 20 mg  20 mg Oral Q lunch Adrian SaranSital Mody, MD      .  metoprolol tartrate (LOPRESSOR) tablet 25 mg  25 mg Oral BID Adrian SaranSital Mody, MD      . ondansetron (ZOFRAN) tablet 4 mg  4 mg Oral Q6H PRN Adrian SaranSital Mody, MD       Or  . ondansetron (ZOFRAN) injection 4 mg  4 mg Intravenous Q6H PRN Sital Mody, MD      . senna-docusate (Senokot-S) tablet 1 tablet  1 tablet Oral QHS PRN Adrian SaranSital Mody, MD      . vitamin E capsule 400 Units  400 Units Oral Daily Adrian SaranSital Mody, MD         Discharge Medications: Please see discharge summary for a list of discharge medications.  Relevant Imaging Results:  Relevant Lab Results:   Additional Information  (SSN: [id number])  Avyon Herendeen, Darleen CrockerBailey M, LCSW

## 2016-09-07 NOTE — Brief Op Note (Signed)
09/07/2016  9:51 PM  PATIENT:  Brandi Scott  82 y.o. female  PRE-OPERATIVE DIAGNOSIS:  left  bimalleolar ankle fracture  POST-OPERATIVE DIAGNOSIS:  left  bimalleolar ankle fracture  PROCEDURE:  Procedure(s): OPEN REDUCTION INTERNAL FIXATION (ORIF) ANKLE FRACTURE (Left)  SURGEON:  Surgeon(s) and Role:    * Donato HeinzJames P Hooten, MD - Primary :  ASSISTANTS: none   ANESTHESIA:   general  EBL:  Total I/O In: -  Out: 25 [Blood:25]  BLOOD ADMINISTERED:none  DRAINS: none   LOCAL MEDICATIONS USED:  MARCAINE     SPECIMEN:  No Specimen  DISPOSITION OF SPECIMEN:  N/A  COUNTS:  YES  TOURNIQUET:   Total Tourniquet Time Documented: Calf (Left) - 89 minutes Total: Calf (Left) - 89 minutes   DICTATION: .Reubin Milanragon Dictation  PLAN OF CARE: Admit to inpatient   PATIENT DISPOSITION:  PACU - hemodynamically stable.   Delay start of Pharmacological VTE agent (>24hrs) due to surgical blood loss or risk of bleeding: not applicable

## 2016-09-07 NOTE — Op Note (Signed)
OPERATIVE NOTE  DATE OF SURGERY:  09/07/2016  PATIENT NAME:  Brandi Scott   DOB: 1929/08/20  MRN: [id number]  PRE-OPERATIVE DIAGNOSIS: Left bimalleolar ankle fracture  POST-OPERATIVE DIAGNOSIS:  Same  PROCEDURE:  Open reduction and internal fixation of the left bimalleolar ankle fracture  SURGEON:  Jena GaussJames P Bethaney Oshana, Jr. M.D.  ANESTHESIA: general  ESTIMATED BLOOD LOSS: 25 ml  FLUIDS REPLACED: 800 mL of crystalloid  TOURNIQUET TIME: 90 minutes  DRAINS: None  IMPLANTS UTILIZED: Synthes 7 hole 1/3 tubular plate, 6 - 3.5 mm cortical screws, 1 - 4.0 mm fully threaded cancellous screws, 2 - 4.0 mm cannulated partially threaded cancellous screws  INDICATIONS FOR SURGERY: Brandi ShearerFlorence J Rosenkranz is a 82 y.o. year old female who sustained a left bimalleolar ankle fracture. After discussion of the risks and benefits of surgical intervention, the patient expressed understanding of the risks benefits and agree with plans for open reduction and internal fixation.   PROCEDURE IN DETAIL: The patient was brought into the operating room and after adequate general anesthesia, a tourniquet was placed on the patient's left thigh.The left foot and ankle were prepped with alcohol and Duraprep and draped in the usual sterile fashion. A "time-out" was performed as per usual protocol. The foot and ankle were exsanguinated using an Esmarch and the tourniquet was inflated to 300 mmHg. A lateral longitudinal incision was made in line with the distal fibula. Dissection was carried down to the fracture site and the site was debrided of hematoma and soft tissue. A provisional reduction of the distal fibular fracture was performed and position visualized using the FluoroScan. A 7 hole one third tubular plate was contoured to fit along the lateral aspect of the distal fibula. The plate was then secured using a combination of six 3.5 mm cortical screws and one 4.0 mm fully threaded cancellous screws. Good reduction and hardware  placement was noted using the FluoroScan. The lateral incision was then closed in layers using first #0 Vicryl followed by #2-0 Vicryl.  Next, attention was directed to the medial malleolus. A medial incision was made and dissection was carried down to the distal aspect of the medial malleolus. Provisional reduction was performed and visualized using the FluoroScan. The reduction was maintained using 2 distally threaded guidewires passed in a retrograde fashion and in parallel position. Good position was noted. Measurements were obtained and two 4.0 mm cannulated partially threaded cancellous screws were advanced over the guidewires. Good position was noted. Guidewires were removed and the wound was irrigated with copious amounts of saline with antibiotic solution and then closed using combination of #0 Vicryl followed #2-0 Vicryl. Good reduction with restoration of the ankle mortise and good position of hardware was noted in multiple planes using FluoroScan. Skin edges were then reapproximated using skin staples. Tourniquet was deflated after total tourniquet time of 90 minutes. A sterile dressing was applied followed by application of a posterior splint.   The patient tolerated the procedure well and was transported to the PACU in stable condition.  Draycen Leichter P. Angie FavaHooten, Jr., M.D.

## 2016-09-07 NOTE — ED Notes (Signed)
See triage note  States she slipped ice  Swelling and pain to left ankle

## 2016-09-08 LAB — BASIC METABOLIC PANEL
Anion gap: 6 (ref 5–15)
BUN: 15 mg/dL (ref 6–20)
CALCIUM: 8.2 mg/dL — AB (ref 8.9–10.3)
CHLORIDE: 108 mmol/L (ref 101–111)
CO2: 22 mmol/L (ref 22–32)
CREATININE: 0.92 mg/dL (ref 0.44–1.00)
GFR, EST NON AFRICAN AMERICAN: 55 mL/min — AB (ref >60)
Glucose, Bld: 126 mg/dL — ABNORMAL HIGH (ref 65–99)
Potassium: 3.5 mmol/L (ref 3.5–5.1)
SODIUM: 136 mmol/L (ref 135–145)

## 2016-09-08 LAB — CBC
HCT: 33.5 % — ABNORMAL LOW (ref 35.0–47.0)
HEMOGLOBIN: 11.7 g/dL — AB (ref 12.0–16.0)
MCH: 31.3 pg (ref 26.0–34.0)
MCHC: 34.8 g/dL (ref 32.0–36.0)
MCV: 89.9 fL (ref 80.0–100.0)
PLATELETS: 196 10*3/uL (ref 150–440)
RBC: 3.72 MIL/uL — ABNORMAL LOW (ref 3.80–5.20)
RDW: 13 % (ref 11.5–14.5)
WBC: 7.4 10*3/uL (ref 3.6–11.0)

## 2016-09-08 MED ORDER — MUPIROCIN 2 % EX OINT
1.0000 "application " | TOPICAL_OINTMENT | Freq: Two times a day (BID) | CUTANEOUS | Status: DC
  Administered 2016-09-08 – 2016-09-09 (×3): 1 via NASAL
  Filled 2016-09-08: qty 22

## 2016-09-08 NOTE — Evaluation (Addendum)
Occupational Therapy Evaluation Patient Details Name: Brandi ShearerFlorence J Scott MRN: [id number] DOB: [birth year] Today's Date: 09/08/2016    History of Present Illness Pt. is an 82 y.o. female who was [facility] to [facility] for ORIF repair of a Left Bimalleolar Ankle Fracture.    Clinical Impression   Pt. Is an 82 y.o. female who was [facility] to [facility] for ORIF repair of a Left Bilmalleolar Ankle Fracture. Pt. Presents with weakness, pain, and decreased functional mobility for ADLs which hinder her ability to complete ADL tasks. Pt. could benefit from skilled OT services for ADL training, A/E training, UE there. Ex, and pt. education about work simplification techniques for ADLs, home modification, and DME. Pt. plans to go to SNF upon discharge. Pt. Could benefit from OT follow-up services.     Follow Up Recommendations  SNF    Equipment Recommendations       Recommendations for Other Services       Precautions / Restrictions Precautions Precautions: Fall Restrictions Weight Bearing Restrictions: Yes LLE Weight Bearing: Non weight bearing                                                     ADL Overall ADL's : Needs assistance/impaired Eating/Feeding: Set up   Grooming: Set up               Lower Body Dressing: Moderate assistance                 General ADL Comments: Pt. education was provided about A/E use for LE ADLs.     Vision     Perception     Praxis      Pertinent Vitals/Pain Pain Assessment: 0-10 Pain Score: 4  Pain Location: Left LE     Hand Dominance Right   Extremity/Trunk Assessment Upper Extremity Assessment Upper Extremity Assessment: Generalized weakness           Communication Communication Communication: No difficulties   Cognition Arousal/Alertness: Awake/alert Behavior During Therapy: WFL for tasks assessed/performed Overall Cognitive Status: Within Functional Limits for tasks assessed                      General Comments       Exercises       Shoulder Instructions      Home Living Family/patient expects to be discharged to:: Private residence Living Arrangements: Children Available Help at Discharge: Family (Pt. resides with son, however he works in [employer] sporadic hours.) Type of Home: House Home Access: Ramped entrance     Home Layout: One level     Bathroom Shower/Tub: Tub/shower unit;Curtain Shower/tub characteristics: Curtain   Bathroom Accessibility: Yes   Home Equipment: Environmental consultantWalker - 2 wheels;Shower seat          Prior Functioning/Environment Level of Independence: Independent                 OT Problem List: Decreased strength;Decreased range of motion;Pain;Decreased activity tolerance;Decreased knowledge of use of DME or AE   OT Treatment/Interventions: Self-care/ADL training;DME and/or AE instruction;Therapeutic activities;Therapeutic exercise;Patient/family education    OT Goals(Current goals can be found in the care plan section) Acute Rehab OT Goals Patient Stated Goal: To go to rehab prior to returning home. OT Goal Formulation: With patient Potential to Achieve Goals: Good  OT Frequency: Min 1X/week  Barriers to D/C:            Co-evaluation              End of Session    Activity Tolerance: Patient tolerated treatment well Patient left: in chair;with call bell/phone within reach;with chair alarm set   Time: 1110-1133 OT Time Calculation (min): 23 min Charges:  OT General Charges $OT Visit: 1 Procedure OT Evaluation $OT Eval Moderate Complexity: 1 Procedure G-Codes:    Olegario MessierElaine Retal Tonkinson, MS, OTR/L 09/08/2016, 11:41 AM

## 2016-09-08 NOTE — [place] Notes (Signed)
Patient chose [facility]. [facility] admissions coordinator at [facility] is aware of accepted bed offer.   Baker Hughes IncorporatedBailey Lilyanne Mcquown, LCSW [phone]

## 2016-09-08 NOTE — Evaluation (Signed)
Physical Therapy Evaluation Patient Details Name: Brandi ShearerFlorence J Scott MRN: [id number] DOB: 1928-12-27 Today's Date: 09/08/2016   History of Present Illness  82 y/o female here with L ankle fracture with ORIF repair  Clinical Impression  Pt shows very good effort with PT exam and was able to maintain NWBing on the L relatively well during transfers/ambulation.  She was able to participate with ~10 minutes of light supine exercises apart from the exam and generally did well with PT exam.  She is very limited secondary to NWbing status and needed some assist with mobility and will not be able to return home in her current functional status.     Follow Up Recommendations SNF    Equipment Recommendations  Rolling walker with 5" wheels    Recommendations for Other Services       Precautions / Restrictions Precautions Precautions: Fall Restrictions Weight Bearing Restrictions: Yes LLE Weight Bearing: Non weight bearing      Mobility  Bed Mobility Overal bed mobility: Needs Assistance Bed Mobility: Supine to Sit     Supine to sit: Min assist     General bed mobility comments: Pt showed very good effort getting to EOB, needed only light assist with R LE to get to EOB, able to pull herself up to sitting  Transfers Overall transfer level: Modified independent Equipment used: Rolling walker (2 wheeled)             General transfer comment: Pt is able to rise to standing with good push from L LE and UEs pt needs walker for stability but overall did well.   Ambulation/Gait Ambulation/Gait assistance: Min assist Ambulation Distance (Feet): 8 Feet Assistive device: Rolling walker (2 wheeled)       General Gait Details: Pt was able to keep weight off L LE and showed good strength with UEs using walker to do appropriate hopping steps.  She shows good effort and despite some fatigue she was safe and relatively confident for limited distance.   Stairs            Wheelchair  Mobility    Modified Rankin (Stroke Patients Only)       Balance Overall balance assessment: Modified Independent                                           Pertinent Vitals/Pain Pain Assessment: 0-10 Pain Score: 4  (reports pain as not too bad) Pain Location: Left LE    Home Living Family/patient expects to be discharged to:: Skilled nursing facility Living Arrangements: Other relatives Available Help at Discharge: Family Type of Home: House Home Access: Ramped entrance     Home Layout: One level Home Equipment: Environmental consultantWalker - 2 wheels;Shower seat      Prior Function Level of Independence: Independent         Comments: Pt reports that she was generally active and able to do what she needs     Hand Dominance   Dominant Hand: Right    Extremity/Trunk Assessment   Upper Extremity Assessment: Overall WFL for tasks assessed           Lower Extremity Assessment:  (R knee with considerable OA, grossly functional, L 3+/5 t/o)         Communication   Communication: No difficulties  Cognition Arousal/Alertness: Awake/alert Behavior During Therapy: WFL for tasks assessed/performed Overall Cognitive Status: Within Functional Limits  for tasks assessed                      General Comments      Exercises General Exercises - Lower Extremity Ankle Circles/Pumps: AROM;10 reps (gentle toe/ankle limited AROM on L in splint) Quad Sets: Strengthening;10 reps Gluteal Sets: Strengthening;10 reps Heel Slides: AROM;10 reps Hip ABduction/ADduction: Strengthening;10 reps   Assessment/Plan    PT Assessment Patient needs continued PT services  PT Problem List Decreased strength;Decreased range of motion;Decreased activity tolerance;Decreased balance;Decreased mobility;Decreased safety awareness;Decreased knowledge of use of DME;Cardiopulmonary status limiting activity          PT Treatment Interventions DME instruction;Gait training;Stair  training;Functional mobility training;Therapeutic activities;Therapeutic exercise;Balance training;Patient/family education    PT Goals (Current goals can be found in the Care Plan section)  Acute Rehab PT Goals Patient Stated Goal: To go to rehab prior to returning home. PT Goal Formulation: With patient Time For Goal Achievement: 09/22/16 Potential to Achieve Goals: Good    Frequency BID   Barriers to discharge        Co-evaluation               End of Session Equipment Utilized During Treatment: Gait belt Activity Tolerance: Patient limited by fatigue Patient left: with chair alarm set;with call bell/phone within reach           Time: 0953-1023 PT Time Calculation (min) (ACUTE ONLY): 30 min   Charges:   PT Evaluation $PT Eval Low Complexity: 1 Procedure PT Treatments $Therapeutic Exercise: 8-22 mins   PT G Codes:        Malachi ProGalen R Lissandro Dilorenzo, DPT 09/08/2016, 1:12 PM

## 2016-09-08 NOTE — Anesthesia Postprocedure Evaluation (Signed)
Anesthesia Post Note  Patient: Brandi Scott  Procedure(s) Performed: Procedure(s) (LRB): OPEN REDUCTION INTERNAL FIXATION (ORIF) ANKLE FRACTURE (Left)  Patient location during evaluation: PACU Anesthesia Type: General Level of consciousness: awake and alert Pain management: pain level controlled Vital Signs Assessment: post-procedure vital signs reviewed and stable Respiratory status: spontaneous breathing, nonlabored ventilation, respiratory function stable and patient connected to nasal cannula oxygen Cardiovascular status: blood pressure returned to baseline and stable Postop Assessment: no signs of nausea or vomiting Anesthetic complications: no    Last Vitals:  Vitals:   09/07/16 2232 09/07/16 2311  BP: (!) 144/68 (!) 144/76  Pulse: (!) 105 (!) 104  Resp: 18 18  Temp: 36.4 C 36.3 C    Last Pain:  Vitals:   09/08/16 0006  TempSrc:   PainSc: 1                  Lenard SimmerAndrew Lakiyah Arntson

## 2016-09-08 NOTE — [place] Notes (Signed)
Pt remaining alert and oriented. Minimal pain this shift. Pain controled with oral tramadol. Dressing remaining dry and intact. Pt able to wiggle toes without difficulty. Toes warm to the touch and have full sensation. Able to sleep some during the night.

## 2016-09-08 NOTE — Clinical Social Work Placement (Signed)
   CLINICAL SOCIAL WORK PLACEMENT  NOTE  Date:  09/08/2016  Patient Details  Name: Brandi Scott MRN: [id number] Date of Birth: 05-04-1929  Clinical Social Work is seeking post-discharge placement for this patient at the Skilled  Nursing Facility level of care (*CSW will initial, date and re-position this form in  chart as items are completed):  Yes   Patient/family provided with [facility] Clinical Social Work Department's list of facilities offering this level of care within the geographic area requested by the patient (or if unable, by the patient's family).  Yes   Patient/family informed of their freedom to choose among providers that offer the needed level of care, that participate in Medicare, Medicaid or managed care program needed by the patient, have an available bed and are willing to accept the patient.  Yes   Patient/family informed of 's ownership interest in [facility] and [facility], as well as of the fact that they are under no obligation to receive care at these facilities.  PASRR submitted to EDS on 09/07/16     PASRR number received on 09/07/16     Existing PASRR number confirmed on       FL2 transmitted to all facilities in geographic area requested by pt/family on 09/08/16     FL2 transmitted to all facilities within larger geographic area on       Patient informed that his/her managed care company has contracts with or will negotiate with certain facilities, including the following:        Yes   Patient/family informed of bed offers received.  Patient chooses bed at       Physician recommends and patient chooses bed at      Patient to be transferred to   on  .  Patient to be transferred to facility by       Patient family notified on   of transfer.  Name of family member notified:        PHYSICIAN       Additional Comment:    _______________________________________________ Aunisty Reali, Darleen CrockerBailey M, LCSW 09/08/2016, 12:08  PM

## 2016-09-08 NOTE — Progress Notes (Signed)
Physical Therapy Treatment Patient Details Name: Brandi ShearerFlorence J Scott MRN: [id number] DOB: 1929/03/07 Today's Date: 09/08/2016    History of Present Illness 82 y/o female here with L ankle fracture with ORIF repair    PT Comments    Pt agreeable to PT; notes very fatigued. Pt reports pain in Left lower extremity is a little better, but increases in dependent position with sitting. Requires increased assist this afternoon session for all mobility and has uncontrolled descent to bed prematurely requiring heavy Mod A. Pt participates in limited exercises session supine. Pt notes muscle soreness in left hip flexors; to be expected with increased work load with NWB on left. Continue PT to progress strength, endurance and balance to improve all functional mobility.   Follow Up Recommendations  SNF     Equipment Recommendations  Rolling walker with 5" wheels    Recommendations for Other Services       Precautions / Restrictions Precautions Precautions: Fall Restrictions Weight Bearing Restrictions: Yes LLE Weight Bearing: Non weight bearing    Mobility  Bed Mobility Overal bed mobility: Needs Assistance Bed Mobility: Sit to Supine     Supine to sit: Min assist Sit to supine: Min assist   General bed mobility comments: Min A for LLE into bed and propped up; pt repositions upward in bed independently  Transfers Overall transfer level: Needs assistance Equipment used: Rolling walker (2 wheeled) Transfers: Sit to/from Stand Sit to Stand: Min assist;Mod assist         General transfer comment: Min guard to stand; Min A to steady while getting hands to rw. Mod A for sit due to fatigue and uncontrolled descent to bed  Ambulation/Gait Ambulation/Gait assistance: Min assist Ambulation Distance (Feet): 5 Feet Assistive device: Rolling walker (2 wheeled) Gait Pattern/deviations:  (Hopping on RLE)   Gait velocity interpretation: <1.8 ft/sec, indicative of risk for recurrent  falls General Gait Details: Min assist to steady with hopping chair to bed   Stairs            Wheelchair Mobility    Modified Rankin (Stroke Patients Only)       Balance Overall balance assessment: Needs assistance   Sitting balance-Leahy Scale: Good     Standing balance support: Bilateral upper extremity supported Standing balance-Leahy Scale: Fair                      Cognition Arousal/Alertness: Awake/alert (fatigued) Behavior During Therapy: WFL for tasks assessed/performed Overall Cognitive Status: Within Functional Limits for tasks assessed                      Exercises General Exercises - Lower Extremity Ankle Circles/Pumps: AROM;Right;Supine;20 reps Quad Sets: Strengthening;Both;10 reps;Supine Gluteal Sets: Strengthening;Both;10 reps;Supine Heel Slides: AROM;Both;10 reps;Supine Hip ABduction/ADduction: Strengthening;10 reps Hip Flexion/Marching: Strengthening;Left;5 reps;Standing    General Comments        Pertinent Vitals/Pain Pain Assessment: 0-10 Pain Score: 2  (hurts more in dependent position) Pain Location: L ankle/foot    Home Living Family/patient expects to be discharged to:: Skilled nursing facility Living Arrangements: Other relatives Available Help at Discharge: Family Type of Home: House Home Access: Ramped entrance   Home Layout: One level Home Equipment: Environmental consultantWalker - 2 wheels;Shower seat      Prior Function Level of Independence: Independent      Comments: Pt reports that she was generally active and able to do what she needs   PT Goals (current goals can now be found in  the care plan section) Acute Rehab PT Goals Patient Stated Goal: To go to rehab prior to returning home. PT Goal Formulation: With patient Time For Goal Achievement: 09/22/16 Potential to Achieve Goals: Good Progress towards PT goals: Progressing toward goals    Frequency    BID      PT Plan Current plan remains appropriate     Co-evaluation             End of Session Equipment Utilized During Treatment: Gait belt Activity Tolerance: Patient limited by fatigue Patient left: in bed;with call bell/phone within reach;with bed alarm set;with nursing/sitter in room;with SCD's reapplied;Other (comment) (polar care in place)     Time: [id number] PT Time Calculation (min) (ACUTE ONLY): 23 min  Charges:  $Gait Training: 8-22 mins $Therapeutic Exercise: 8-22 mins                    G Codes:      Scot DockHeidi E Joshua Zeringue, PTA 09/08/2016, 3:21 PM

## 2016-09-08 NOTE — Clinical Social Work Note (Signed)
Clinical Social Work Assessment  Patient Details  Name: Brandi Scott MRN: [id number] Date of Birth: 05-11-1929  Date of referral:  09/08/16               Reason for consult:  Facility Placement                Permission sought to share information with:  Chartered certified accountant granted to share information::  Yes, Verbal Permission Granted  Name::      [place]::   Joaquin   Relationship::     Contact Information:     Housing/Transportation Living arrangements for the past 2 months:  [place] of Information:  Patient Patient Interpreter Needed:  None Criminal Activity/Legal Involvement Pertinent to Current Situation/Hospitalization:  No - Comment as needed Significant Relationships:  Other Family Members Lives with:  Relatives Do you feel safe going back to the place where you live?  Yes Need for family participation in patient care:  Yes (Comment)  Care giving concerns:  Patient lives in [place] with her nephew Alvester Chou.    Social Worker assessment / plan:  Holiday representative (CSW) received SNF consult. PT is recommending SNF. CSW met with patient to discuss D/C plan. Patient was alert and oriented and sitting in the chair. CSW introduced self and explained role of CSW department. Patient reported that she lives with her nephew and wants to go to rehab. CSW explained SNF process. Patient is agreeable to SNF search in [facility]. FL2 complete and faxed out. CSW presented bed offers. Patient reported that she is deciding between [facility] and [facility]. Per patient she will discuss offers with her nephew and get back to CSW. CSW left Alvester Chou a [employer]. CSW will continue to follow and assist as needed.   Employment status:  Retired Nurse, adult PT Recommendations:  [place] / Referral to community resources:  [place]  Patient/Family's Response to care:  Patient is agreeable to SNF.   Patient/Family's Understanding of and Emotional Response to Diagnosis, Current Treatment, and Prognosis:  Patient was very pleasant and thanked CSW for assistance.   Emotional Assessment Appearance:  Appears stated age Attitude/Demeanor/Rapport:    Affect (typically observed):  Accepting, Adaptable, Pleasant Orientation:  Oriented to Self, Oriented to Place, Oriented to  Time, Oriented to Situation Alcohol / Substance use:  Not Applicable Psych involvement (Current and /or in the community):  No (Comment)  Discharge Needs  Concerns to be addressed:  Discharge Planning Concerns Readmission within the last 30 days:  No Current discharge risk:  Dependent with Mobility Barriers to Discharge:  Continued Medical Work up   [employer], Veronia Beets, LCSW 09/08/2016, 12:09 PM

## 2016-09-08 NOTE — Progress Notes (Signed)
[facility] [place] [facility]   PATIENT NAME: Brandi BeersFlorence Thornton    MR#:  [id number]  DATE OF BIRTH:  15-Sep-1929  SUBJECTIVE:seen at bedsides/p ankle fracture repair.doing well  CHIEF COMPLAINT:   Chief Complaint  Patient presents with  . Fall    REVIEW OF SYSTEMS:   ROS CONSTITUTIONAL: No fever, fatigue or weakness.  EYES: No blurred or double vision.  EARS, NOSE, AND THROAT: No tinnitus or ear pain.  RESPIRATORY: No cough, shortness of breath, wheezing or hemoptysis.  CARDIOVASCULAR: No chest pain, orthopnea, edema.  GASTROINTESTINAL: No nausea, vomiting, diarrhea or abdominal pain.  GENITOURINARY: No dysuria, hematuria.  ENDOCRINE: No polyuria, nocturia,  HEMATOLOGY: No anemia, easy bruising or bleeding SKIN: No rash or lesion. MUSCULOSKELETAL:s/ ankle fracture repair.[name].   NEUROLOGIC: No tingling, numbness, weakness.  PSYCHIATRY: No anxiety or depression.   DRUG ALLERGIES:  No Known Allergies  VITALS:  Blood pressure 122/68, pulse 80, temperature 97.8 F (36.6 C), temperature source Oral, resp. rate 16, height 5\' 7"  (1.702 m), weight 64.4 kg (142 lb), SpO2 93 %.  PHYSICAL EXAMINATION:  GENERAL:  82 y.o.-year-old patient lying in the bed with no acute distress.  EYES: Pupils equal, round, reactive to light and accommodation. No scleral icterus. Extraocular muscles intact.  HEENT: Head atraumatic, normocephalic. Oropharynx and nasopharynx clear.  NECK:  Supple, no jugular venous distention. No thyroid enlargement, no tenderness.  LUNGS: Normal breath sounds bilaterally, no wheezing, rales,rhonchi or crepitation. No use of accessory muscles of respiration.  CARDIOVASCULAR: S1, S2 normal. No murmurs, rubs, or gallops.  ABDOMEN: Soft, nontender, nondistended. Bowel sounds present. No organomegaly or mass.  EXTREMITIES: No pedal edema, cyanosis, or clubbing.  NEUROLOGIC: Cranial nerves II through XII are intact. Muscle strength 5/5 in all  extremities. Sensation intact. Gait not checked.  PSYCHIATRIC: The patient is alert and oriented x 3.  SKIN: No obvious rash, lesion, or ulcer.    LABORATORY PANEL:   CBC  Recent Labs Lab 09/08/16 0440  WBC 7.4  HGB 11.7*  HCT 33.5*  PLT 196   ------------------------------------------------------------------------------------------------------------------  Chemistries   Recent Labs Lab 09/08/16 0440  NA 136  K 3.5  CL 108  CO2 22  GLUCOSE 126*  BUN 15  CREATININE 0.92  CALCIUM 8.2*   ------------------------------------------------------------------------------------------------------------------  Cardiac Enzymes No results for input(s): TROPONINI in the last 168 hours. ------------------------------------------------------------------------------------------------------------------  RADIOLOGY:  Dg Chest 2 View  Result Date: 09/07/2016 CLINICAL DATA:  Preoperative evaluation for ankle surgery EXAM: CHEST  2 VIEW COMPARISON:  January 13, 2014 FINDINGS: There is mild bibasilar scarring. There is no edema or consolidation. The heart size and pulmonary vascularity are normal. There is atherosclerotic calcification in the aorta. There is thoracolumbar levoscoliosis. IMPRESSION: Mild bibasilar scarring. No edema or consolidation. Aortic atherosclerosis. Electronically Signed   By: Bretta BangWilliam  Woodruff III M.D.   On: 09/07/2016 12:10   Dg Ankle Complete Left  Result Date: 09/07/2016 CLINICAL DATA:  Pain after fall EXAM: LEFT ANKLE COMPLETE - 3+ VIEW COMPARISON:  None. FINDINGS: Frontal, oblique, and lateral views were obtained. There is diffuse soft tissue swelling. There is a transversely oriented fracture of the medial malleolus with lateral displacement of the medial malleolus compared to the remainder the tibia. There is an obliquely oriented fracture of the distal fibular diaphysis with lateral displacement distally. There is ankle mortise disruption. There is a spur arising  from the inferior calcaneus. There is no appreciable joint space narrowing. IMPRESSION: Fractures of the medial malleolus and  distal fibula with ankle mortise disruption. Diffuse soft tissue swelling. Electronically Signed   By: Bretta BangWilliam  Woodruff III M.D.   On: 09/07/2016 11:05    EKG:   Orders placed or performed during the hospital encounter of 09/07/16  . ED EKG  . ED EKG  . EKG 12-Lead  . EKG 12-Lead    ASSESSMENT AND PLAN:  [age] yr old  Female with left ankle fracture s/p  repair; pain is little  Better.NWB to left leg,PT recommends SNF.she has arthirtis  in right leg.  2.HTN;controlled. likely d/c am to SNF.,[facility],    All the records are reviewed and case discussed with Care Management/Social Workerr. Management plans discussed with the patient, family and they are in agreement.  CODE STATUS:full  TOTAL TIME TAKING CARE OF THIS PATIENT: 35 min minutes.   POSSIBLE D/C IN 1-2 DAYS, DEPENDING ON CLINICAL CONDITION.   Katha HammingKONIDENA,Pesach Frisch M.D on 09/08/2016 at 8:20 PM  Between 7am to 6pm - Pager - [phone]  After 6pm go to [email or web address] - password [email or web address]  [facility] [place]  Office  [phone]  CC: Primary care physician; Jerl MinaJames Hedrick, MD   Note: This dictation was prepared with Dragon dictation along with smaller phrase technology. Any transcriptional errors that result from this process are unintentional.

## 2016-09-08 NOTE — Progress Notes (Signed)
   Subjective: 1 Day Post-Op Procedure(s) (LRB): OPEN REDUCTION INTERNAL FIXATION (ORIF) ANKLE FRACTURE (Left) Patient reports pain as 3 on 0-10 scale.   Patient is well, and has had no acute complaints or problems We will start therapy today.  Plan is to go Rehab after hospital stay. no nausea and no vomiting Patient denies any chest pains or shortness of breath. Objective: Vital signs in last 24 hours: Temp:  [97.3 F (36.3 C)-98.1 F (36.7 C)] 97.7 F (36.5 C) (12/12 0318) Pulse Rate:  [78-106] 103 (12/12 0318) Resp:  [14-20] 19 (12/12 0318) BP: (119-145)/(61-85) 119/61 (12/12 0318) SpO2:  [92 %-100 %] 93 % (12/12 0318) Weight:  [64.4 kg (142 lb)] 64.4 kg (142 lb) (12/11 1027) Heels are non tender and elevated off the bed using rolled towels Left lower extremity elevated on 2 pillows with Polar Care in place  Intake/Output from previous day: 12/11 0701 - 12/12 0700 In: 965 [P.O.:240; I.V.:525; IV Piggyback:200] Out: 25 [Blood:25] Intake/Output this shift: No intake/output data recorded.   Recent Labs  09/07/16 1130 09/08/16 0440  HGB 14.1 11.7*    Recent Labs  09/07/16 1130 09/08/16 0440  WBC 11.6* 7.4  RBC 4.69 3.72*  HCT 42.3 33.5*  PLT 240 196    Recent Labs  09/07/16 1130 09/08/16 0440  NA 140 136  K 4.2 3.5  CL 109 108  CO2 23 22  BUN 28* 15  CREATININE 0.97 0.92  GLUCOSE 102* 126*  CALCIUM 9.7 8.2*   No results for input(s): LABPT, INR in the last 72 hours.  EXAM General - Patient is Alert, Appropriate and Oriented Extremity - Neurologically intact Neurovascular intact Sensation intact distally Intact pulses distally Dorsiflexion/Plantar flexion intact Compartment soft Dressing - dressing C/D/I Motor Function - intact, moving foot and toes well on exam.    Past Medical History:  Diagnosis Date  . Chronic radicular low back pain   . Essential hypertension   . 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 Hyperlipidemia   . Osteoarthritis of both hips    And knees as well as back.   . Valvular heart disease    2013 Echo: Moderate MR and moderate-severe TR on echocardiogram; Recheck Echo 2015: mild MR & TR    Assessment/Plan: 1 Day Post-Op Procedure(s) (LRB): OPEN REDUCTION INTERNAL FIXATION (ORIF) ANKLE FRACTURE (Left) Active Problems:   Ankle fracture, bimalleolar, closed, left, initial encounter  Estimated body mass index is 22.24 kg/m as calculated from the following:   Height as of this encounter: 5\' 7"  (1.702 m).   Weight as of this encounter: 64.4 kg (142 lb). Advance diet Up with therapy D/C IV fluids Plan for discharge tomorrow Discharge to SNF  Labs: Were reviewed DVT Prophylaxis - Aspirin Nonweightbearing left lower extremity D/C O2 and Pulse OX and try on Room Air Patient will need [facility] 1 week Continue 325 mg aspirin at discharge Begin working on bowel movement Continue Polar Care around-the-clock 2 left ankle  Jon R. [facility] Center[facility]fe PA [facility] 09/08/2016, 7:35 AM

## 2016-09-09 ENCOUNTER — Encounter: Payer: Self-pay | Admitting: Orthopedic Surgery

## 2016-09-09 DIAGNOSIS — Z7982 Long term (current) use of aspirin: Secondary | ICD-10-CM | POA: Insufficient documentation

## 2016-09-09 DIAGNOSIS — I110 Hypertensive heart disease with heart failure: Secondary | ICD-10-CM | POA: Insufficient documentation

## 2016-09-09 DIAGNOSIS — S82843A Displaced bimalleolar fracture of unspecified lower leg, initial encounter for closed fracture: Secondary | ICD-10-CM | POA: Insufficient documentation

## 2016-09-09 DIAGNOSIS — M170 Bilateral primary osteoarthritis of knee: Secondary | ICD-10-CM | POA: Insufficient documentation

## 2016-09-09 DIAGNOSIS — E782 Mixed hyperlipidemia: Secondary | ICD-10-CM | POA: Insufficient documentation

## 2016-09-09 DIAGNOSIS — A4902 Methicillin resistant Staphylococcus aureus infection, unspecified site: Secondary | ICD-10-CM | POA: Insufficient documentation

## 2016-09-09 DIAGNOSIS — M4300 Spondylolysis, site unspecified: Secondary | ICD-10-CM | POA: Insufficient documentation

## 2016-09-09 DIAGNOSIS — W19XXXA Unspecified fall, initial encounter: Secondary | ICD-10-CM | POA: Insufficient documentation

## 2016-09-09 DIAGNOSIS — M160 Bilateral primary osteoarthritis of hip: Secondary | ICD-10-CM | POA: Insufficient documentation

## 2016-09-09 LAB — CBC
HEMATOCRIT: 31.9 % — AB (ref 35.0–47.0)
HEMOGLOBIN: 10.9 g/dL — AB (ref 12.0–16.0)
MCH: 30.6 pg (ref 26.0–34.0)
MCHC: 34.2 g/dL (ref 32.0–36.0)
MCV: 89.6 fL (ref 80.0–100.0)
Platelets: 173 10*3/uL (ref 150–440)
RBC: 3.56 MIL/uL — AB (ref 3.80–5.20)
RDW: 13 % (ref 11.5–14.5)
WBC: 7.1 10*3/uL (ref 3.6–11.0)

## 2016-09-09 LAB — BASIC METABOLIC PANEL
ANION GAP: 3 — AB (ref 5–15)
BUN: 13 mg/dL (ref 6–20)
CHLORIDE: 109 mmol/L (ref 101–111)
CO2: 25 mmol/L (ref 22–32)
Calcium: 8.1 mg/dL — ABNORMAL LOW (ref 8.9–10.3)
Creatinine, Ser: 0.74 mg/dL (ref 0.44–1.00)
GFR calc Af Amer: >60 mL/min (ref >60)
GLUCOSE: 103 mg/dL — AB (ref 65–99)
POTASSIUM: 3.6 mmol/L (ref 3.5–5.1)
Sodium: 137 mmol/L (ref 135–145)

## 2016-09-09 MED ORDER — MUPIROCIN 2 % EX OINT: 1.0000 "application " | TOPICAL_OINTMENT | Freq: Two times a day (BID) | CUTANEOUS | 0 refills | Status: DC

## 2016-09-09 MED ORDER — SENNOSIDES-DOCUSATE SODIUM 8.6-50 MG PO TABS: 1.0000 | ORAL_TABLET | Freq: Two times a day (BID) | ORAL | 0 refills | Status: DC

## 2016-09-09 MED ORDER — BISACODYL 10 MG RE SUPP: 10.0000 mg | Freq: Every day | RECTAL | 0 refills | Status: DC | PRN

## 2016-09-09 MED ORDER — OXYCODONE HCL 5 MG PO TABS: 5.0000 mg | ORAL_TABLET | ORAL | 0 refills | Status: DC | PRN

## 2016-09-09 NOTE — Clinical Social Work Placement (Signed)
   CLINICAL SOCIAL WORK PLACEMENT  NOTE  Date:  09/09/2016  Patient Details  Name: Brandi Scott MRN: [id number] Date of Birth: [birth year]  Clinical Social Work is seeking post-discharge placement for this patient at the Skilled  Nursing Facility level of care (*CSW will initial, date and re-position this form in  chart as items are completed):  Yes   Patient/family provided with [facility] Clinical Social Work Department's list of facilities offering this level of care within the geographic area requested by the patient (or if unable, by the patient's family).  Yes   Patient/family informed of their freedom to choose among providers that offer the needed level of care, that participate in Medicare, Medicaid or managed care program needed by the patient, have an available bed and are willing to accept the patient.  Yes   Patient/family informed of [facility]'s ownership interest in [facility] and [facility], as well as of the fact that they are under no obligation to receive care at these facilities.  PASRR submitted to EDS on 09/07/16     PASRR number received on 09/07/16     Existing PASRR number confirmed on       FL2 transmitted to all facilities in geographic area requested by pt/family on 09/08/16     FL2 transmitted to all facilities within larger geographic area on       Patient informed that his/her managed care company has contracts with or will negotiate with certain facilities, including the following:        Yes   Patient/family informed of bed offers received.  [facility]     Physician recommends and patient chooses bed at      Patient to be transferred to  [facility] ) on 09/09/16.  Patient to be transferred to facility by  [facility] )     Patient family notified on 09/09/16 of transfer.  Name of family member notified:   ([name] is awre of D/C today. )     PHYSICIAN        Additional Comment:    _______________________________________________ Danylah Holden, Darleen CrockerBailey M, LCSW 09/09/2016, 3:14 PM

## 2016-09-09 NOTE — Progress Notes (Signed)
Physical Therapy Treatment Patient Details Name: Brandi ShearerFlorence J Scott MRN: [id number] DOB: 25-Jul-1929 Today's Date: 09/09/2016    History of Present Illness 82 y/o female here with L ankle fracture with ORIF repair    PT Comments    Pt transferring from chair to bed with CGA and Min A for bed mobility.  No c/o increased pain, just feeling tired.  Pt performed supine therex with no adverse effects and continues to progress towards goals. Cont with POC.    Follow Up Recommendations  SNF     Equipment Recommendations  Rolling walker with 5" wheels    Recommendations for Other Services       Precautions / Restrictions Precautions Precautions: Fall Restrictions LLE Weight Bearing: Non weight bearing Other Position/Activity Restrictions: elevate L leg    Mobility  Bed Mobility Overal bed mobility: Needs Assistance Bed Mobility: Sit to Supine     Supine to sit: Min guard;HOB elevated Sit to supine: Min assist   General bed mobility comments: Pt lifting L leg onto bed using UE's for assist; Min A for positioning in bed and sliding up using R LE pushing on bed.  Transfers Overall transfer level: Needs assistance Equipment used: Rolling walker (2 wheeled) Transfers: Sit to/from Stand Sit to Stand: Min guard         General transfer comment: Rising from recliner using B UE on chair rails; extra effort/time to rise.  Ambulation/Gait Ambulation/Gait assistance: Min guard Ambulation Distance (Feet): 5 Feet Assistive device: Rolling walker (2 wheeled)       General Gait Details: Hopping from chair to be; continues with good NWB'ing compliance.   Stairs            Wheelchair Mobility    Modified Rankin (Stroke Patients Only)       Balance                                    Cognition Arousal/Alertness: Awake/alert Behavior During Therapy: WFL for tasks assessed/performed Overall Cognitive Status: Within Functional Limits for tasks  assessed                 General Comments: Plesant and appropriate throughout session.    Exercises General Exercises - Lower Extremity Ankle Circles/Pumps: AROM;Right;Supine;20 reps Quad Sets: Strengthening;Both;10 reps;Supine Heel Slides: AROM;Both;10 reps;Supine Hip ABduction/ADduction: Strengthening;10 reps Straight Leg Raises: AAROM;Strengthening;Both;10 reps    General Comments        Pertinent Vitals/Pain Pain Assessment: 0-10 Pain Score: 3  Pain Location: L ankle/foot Pain Descriptors / Indicators: Aching Pain Intervention(s): Limited activity within patient's tolerance;Monitored during session;Premedicated before session    Home Living                      Prior Function            PT Goals (current goals can now be found in the care plan section) Acute Rehab PT Goals Patient Stated Goal: To go to rehab prior to returning home. PT Goal Formulation: With patient Time For Goal Achievement: 09/22/16 Potential to Achieve Goals: Good Progress towards PT goals: Progressing toward goals    Frequency    BID      PT Plan Current plan remains appropriate    Co-evaluation             End of Session Equipment Utilized During Treatment: Gait belt Activity Tolerance: Patient tolerated treatment well Patient left:  with call bell/phone within reach;with chair alarm set;in bed     Time: [id number] PT Time Calculation (min) (ACUTE ONLY): 24 min  Charges:  $Gait Training: 8-22 mins $Therapeutic Exercise: 8-22 mins                    G Codes:      Shanley Furlough A Meriah Shands, PT 09/09/2016, 1:59 PM

## 2016-09-09 NOTE — Discharge Summary (Signed)
Brandi Scott, is a 82 y.o. female  DOB 02-08-1929  MRN [id number].  Admission date:  09/07/2016  Admitting Physician  Donato HeinzJames P Hooten, MD  Discharge Date:  09/09/2016   Primary MD  Jerl MinaJames Hedrick, MD  Recommendations for primary care physician for things to follow:   Up with primary doctor in one week Follow up with Dr. Ernest Pinehooten in 2 weeks    Admission Diagnosis  Closed left ankle fracture, initial encounter [S82.892A]   Discharge Diagnosis  Closed left ankle fracture, initial encounter [U72.536U][S82.892A]    Active Problems:   Ankle fracture, bimalleolar, closed, left, initial encounter      Past Medical History:  Diagnosis Date  . Chronic radicular low back pain   . Essential hypertension   . 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 Hyperlipidemia   . Osteoarthritis of both hips    And knees as well as back.   . Valvular heart disease    2013 Echo: Moderate MR and moderate-severe TR on echocardiogram; Recheck Echo 2015: mild MR & TR    Past Surgical History:  Procedure Laterality Date  . BREAST EXCISIONAL BIOPSY Right 1971, 1981   negative  . COLONOSCOPY    . Holter Monitor  May 2013   [facility]: Occasional PVCs (benign)  . NM MYOVIEW LTD  May 2013; Dec 2015   @ [facility]: a) 2013: Exercise for 2:30 min (only), hypertensive response no ischemic changes. He has 74%, normal wall motion, no evidence of ischemia or infarction.; b) Ex 3:00 min (dyspnea) 3.3 METs HR 146 (106% MPHR) - no ECG changes, E 70%, no RWMA, No ischemia or infarction  . ORIF ANKLE FRACTURE Left 09/07/2016   Procedure: OPEN REDUCTION INTERNAL FIXATION (ORIF) ANKLE FRACTURE;  Surgeon: Donato HeinzJames P Hooten, MD;  Location: [facility] ORS;  Service: Orthopedics;  Laterality: Left;  .  TONGUE BIOPSY    . TONSILLECTOMY    . TOTAL ABDOMINAL HYSTERECTOMY W/ BILATERAL SALPINGOOPHORECTOMY    . TRANSTHORACIC ECHOCARDIOGRAM  May 2013; May 2015   @ [facility]: a) 2013 : Normal LV function, EF 65%, mild BiAE, Mild LVH, Mod MR, Mod-Severe TR; b) normal LV size, EF> 55%, mild LVH, mild MR, mild TR and       History of present illness and  Hospital Course:     Kindly see H&P for history of present illness and admission details, please review complete Labs, Consult reports and Test reports for all details in brief  HPI  from the history and physical done on the day of admission  82 year old female patient admitted because of the fall, found to have a left bimalleolar ankle fracture.   Hospital Course   #1 acute left foot ankle fracture: Left bimalleolar ankle fracture status post repair in December 11 by Dr. Ernest PineHooten. Postoperatively patient did well, has is some pain but well controlled with pain medicine. Physical therapy recommended rehabilitation. Pt s stable for discharge [facility] today.  #2. Hypertension: Controlled. #MRSA in nares: Bactroban ointment started. discharge Condition:stable   Follow UP   Contact information for follow-up providers    Jerl MinaJames Hedrick, MD Follow up in 1 week(s).   Specialty:  Family Medicine Contact information: [address] [facility] [place] [address] [phone]        Donato HeinzHOOTEN,JAMES P, MD Follow up in 2 week(s).   Specialty:  Orthopedic Surgery Contact information: [address] [facility] [place] [phone]            Contact information for after-discharge care    Destination    [facility] Follow up.   Specialty:  Skilled Nursing Facility Contact information: [address] [place] [id number] [phone]                    Discharge Instructions  and  Discharge Medications         Medication List    TAKE these medications   aspirin EC 81 MG tablet Take 81 mg by mouth at bedtime.   bisacodyl 10 MG suppository Commonly known as:  DULCOLAX Place 1 suppository (10 mg total) rectally daily as needed for moderate constipation.   CALCIUM 600+D 600-800 MG-UNIT Tabs Generic drug:  Calcium Carb-Cholecalciferol Take 1 tablet by mouth daily.   lisinopril 20 MG tablet Commonly known as:  PRINIVIL,ZESTRIL Take 20 mg by mouth daily with lunch.   metoprolol tartrate 25 MG tablet Commonly known as:  LOPRESSOR Take 25 mg by mouth 2 (two) times daily.   mupirocin ointment 2 % Commonly known as:  BACTROBAN Place 1 application into the nose 2 (two) times daily.   oxyCODONE 5 MG immediate release tablet Commonly known as:  Oxy IR/ROXICODONE Take 1-2 tablets (5-10 mg total) by mouth every 4 (four) hours as needed for breakthrough pain ((for MODERATE breakthrough pain)).   senna-docusate 8.6-50 MG tablet Commonly known as:  Senokot-S Take 1 tablet by mouth 2 (two) times daily.   vitamin E 400 UNIT capsule Take 400 Units by mouth daily.         Diet and Activity recommendation: See Discharge Instructions above   Consults obtained - Orthopedic   Major procedures and Radiology Reports - PLEASE review detailed and final reports for all details, in brief -     Dg Chest 2 View  Result Date: 09/07/2016 CLINICAL DATA:  Preoperative evaluation for ankle surgery EXAM: CHEST  2 VIEW COMPARISON:  January 13, 2014 FINDINGS: There is mild bibasilar scarring. There is no edema or consolidation. The heart size and pulmonary vascularity are normal. There is atherosclerotic calcification in the aorta. There is thoracolumbar levoscoliosis. IMPRESSION: Mild bibasilar scarring. No edema or consolidation. Aortic atherosclerosis. Electronically Signed   By: Bretta BangWilliam  Woodruff III M.D.   On: 09/07/2016 12:10   Dg Ankle Complete Left  Result Date: 09/07/2016 CLINICAL DATA:  Pain  after fall EXAM: LEFT ANKLE COMPLETE - 3+ VIEW COMPARISON:  None. FINDINGS: Frontal, oblique, and lateral views were obtained. There is diffuse soft tissue swelling. There is a transversely oriented fracture of the medial malleolus with lateral displacement of the medial malleolus compared to the remainder the tibia. There is an obliquely oriented fracture of the distal fibular diaphysis with lateral displacement distally. There is ankle mortise disruption. There is a spur arising from the inferior calcaneus. There is no appreciable joint space narrowing. IMPRESSION: Fractures of the medial malleolus and distal fibula with ankle mortise disruption. Diffuse soft tissue swelling. Electronically Signed   By: Bretta BangWilliam  Woodruff III M.D.   On: 09/07/2016 11:05    Micro Results     Recent Results (from the past 240 hour(s))  MRSA PCR Screening     Status: Abnormal   Collection Time: 09/07/16  2:50 PM  Result Value Ref Range Status   MRSA by PCR POSITIVE (A) NEGATIVE Final    Comment:  The GeneXpert MRSA Assay (FDA approved for NASAL specimens only), is one component of a comprehensive MRSA colonization surveillance program. It is not intended to diagnose MRSA infection nor to guide or monitor treatment for MRSA infections. RESULT CALLED TO, READ BACK BY AND VERIFIED WITH: Lucendia Herrlich RN AT 1610 09/07/16 MSS.        Today   Subjective:   Brandi Scott today has no headache,no chest abdominal pain,no new weakness tingling or numbness, feels much better wants to go home today.   Objective:   Blood pressure 123/66, pulse 83, temperature 98.4 F (36.9 C), temperature source Oral, resp. rate 16, height 5\' 7"  (1.702 m), weight 64.4 kg (142 lb), SpO2 92 %.   Intake/Output Summary (Last 24 hours) at 09/09/16 1318 Last data filed at 09/09/16 0808  Gross per 24 hour  Intake              240 ml  Output                0 ml  Net              240 ml    Exam Awake Alert, Oriented  x 3, No new F.N deficits, Normal affect [place].AT,PERRAL Supple Neck,No JVD, No cervical lymphadenopathy appriciated.  Symmetrical Chest wall movement, Good air movement bilaterally, CTAB RRR,No Gallops,Rubs or new Murmurs, No Parasternal Heave +ve B.Sounds, Abd Soft, Non tender, No organomegaly appriciated, No rebound -guarding or rigidity. No Cyanosis, Clubbing or edema, No new Rash or bruise  Data Review   CBC w Diff: Lab Results  Component Value Date   WBC 7.1 09/09/2016   HGB 10.9 (L) 09/09/2016   HGB 14.0 01/13/2014   HCT 31.9 (L) 09/09/2016   HCT 42.4 01/13/2014   PLT 173 09/09/2016   PLT 223 01/13/2014   LYMPHOPCT 8 09/07/2016   MONOPCT 7 09/07/2016   EOSPCT 1 09/07/2016   BASOPCT 1 09/07/2016    CMP: Lab Results  Component Value Date   NA 137 09/09/2016   NA 139 01/13/2014   K 3.6 09/09/2016   K 3.4 (L) 01/13/2014   CL 109 09/09/2016   CL 106 01/13/2014   CO2 25 09/09/2016   CO2 24 01/13/2014   BUN 13 09/09/2016   BUN 34 (H) 01/13/2014   CREATININE 0.74 09/09/2016   CREATININE 1.03 01/13/2014   PROT 8.0 01/13/2014   ALBUMIN 3.9 01/13/2014   BILITOT 0.4 01/13/2014   ALKPHOS 93 01/13/2014   AST 54 (H) 01/13/2014   ALT 35 01/13/2014  .   Total Time in preparing paper work, data evaluation and todays exam - 35 minutes  Jennefer Kopp M.D on 09/09/2016 at 1:18 PM    Note: This dictation was prepared with Dragon dictation along with smaller phrase technology. Any transcriptional errors that result from this process are unintentional.

## 2016-09-09 NOTE — Progress Notes (Signed)
Pt being discharged to [facility] today. Report called to Misty StanleyLisa RN at facility, all questions answered. PIV was removed. Discharge instructions reviewed with pt, all questions answered. Prescriptions were included in discharge packet. She is leaving with all her belongings. She will continue PT at facility. She will be transported via ambulance.

## 2016-09-09 NOTE — Progress Notes (Signed)
Physical Therapy Treatment Patient Details Name: Brandi ShearerFlorence J Scott MRN: [id number] DOB: Mar 01, 1929 Today's Date: 09/09/2016    History of Present Illness 82 y/o female here with L ankle fracture with ORIF repair    PT Comments    Pt with good pain control this session after given Tylenol by RN.  Pt with improved bed mobility and transfers this date, requiring less assist.  Extensive education on safety with RW while NWB'ing.  Pt with c/o arm and shoulder pain from increased use of UE's for mobility.  Cont with POC.   Follow Up Recommendations  SNF     Equipment Recommendations  Rolling walker with 5" wheels    Recommendations for Other Services       Precautions / Restrictions Precautions Precautions: Fall Restrictions Weight Bearing Restrictions: Yes LLE Weight Bearing: Non weight bearing Other Position/Activity Restrictions: elevate L leg    Mobility  Bed Mobility Overal bed mobility: Needs Assistance Bed Mobility: Supine to Sit     Supine to sit: Min guard;HOB elevated     General bed mobility comments: Pt able to move B legs to R side of bed using bed rail to assist with trunk/hip rotation.  Transfers Overall transfer level: Needs assistance Equipment used: Rolling walker (2 wheeled) Transfers: Sit to/from Stand Sit to Stand: Min guard         General transfer comment: Rises with B UE assist on bed; Min Guard for balance upon initial standing; good compliance with NWB'ing status.  Ambulation/Gait Ambulation/Gait assistance: Min guard Ambulation Distance (Feet): 5 Feet Assistive device: Rolling walker (2 wheeled)       General Gait Details: Min Guard for hopping from bed to chair, verbal cues for safety and balance with RW.   Stairs            Wheelchair Mobility    Modified Rankin (Stroke Patients Only)       Balance                                    Cognition Arousal/Alertness: Awake/alert Behavior During Therapy:  WFL for tasks assessed/performed Overall Cognitive Status: Within Functional Limits for tasks assessed                 General Comments: Plesant and appropriate throughout session.    Exercises General Exercises - Lower Extremity Ankle Circles/Pumps: AROM;Right;Supine;20 reps Quad Sets: Strengthening;Both;10 reps;Supine Heel Slides: AROM;Both;10 reps;Supine Hip ABduction/ADduction: Strengthening;10 reps Straight Leg Raises: AAROM;Strengthening;Both;10 reps    General Comments        Pertinent Vitals/Pain Pain Assessment: 0-10 Pain Score: 3  Pain Location: L ankle/foot Pain Descriptors / Indicators: Aching Pain Intervention(s): Limited activity within patient's tolerance;Monitored during session;Premedicated before session    Home Living                      Prior Function            PT Goals (current goals can now be found in the care plan section) Acute Rehab PT Goals Patient Stated Goal: To go to rehab prior to returning home. PT Goal Formulation: With patient Time For Goal Achievement: 09/22/16 Potential to Achieve Goals: Good Progress towards PT goals: Progressing toward goals    Frequency    BID      PT Plan Current plan remains appropriate    Co-evaluation  End of Session Equipment Utilized During Treatment: Gait belt Activity Tolerance: Patient tolerated treatment well Patient left: in chair;with call bell/phone within reach;with chair alarm set     Time: [id number] PT Time Calculation (min) (ACUTE ONLY): 28 min  Charges:  $Gait Training: 8-22 mins $Therapeutic Exercise: 8-22 mins                    G Codes:      Brandi Scott Brandi Scott, PT 09/09/2016, 10:52 AM

## 2016-09-09 NOTE — Progress Notes (Signed)
Spoke with Thurston HoleAnne, [facility] at [phone], to notify of non-emergent EMS transport.  Auth notification reference given as [id number].   Service date range good for 90 days starting from 09/09/16.   Geographical gap exception requested to determine if services can be considered at an in-network level.

## 2016-09-09 NOTE — Consult Note (Addendum)
ORTHOPAEDICS PROGRESS NOTE  PATIENT NAME: Brandi ShearerFlorence J Scott DOB: Oct 17, 1928  MRN: [id number]  POD # 2: Open reduction and internal fixation of a left bimalleolar ankle fracture  Subjective: Pain is well-controlled. The patient has been motivated with working with physical therapy.  Objective: Vital signs in last 24 hours: Temp:  [97.8 F (36.6 C)-99 F (37.2 C)] 98.4 F (36.9 C) (12/13 0722) Pulse Rate:  [80-83] 83 (12/13 0722) Resp:  [16-18] 16 (12/13 0722) BP: (120-136)/(63-68) 123/66 (12/13 0722) SpO2:  [92 %-93 %] 92 % (12/13 0722)  Intake/Output from previous day: 12/12 0701 - 12/13 0700 In: 615 [P.O.:240; I.V.:375] Out: -    Recent Labs  09/07/16 1130 09/08/16 0440 09/09/16 0404  WBC 11.6* 7.4 7.1  HGB 14.1 11.7* 10.9*  HCT 42.3 33.5* 31.9*  PLT 240 196 173  K 4.2 3.5 3.6  CL 109 108 109  CO2 23 22 25   BUN 28* 15 13  CREATININE 0.97 0.92 0.74  GLUCOSE 102* 126* 103*  CALCIUM 9.7 8.2* 8.1*    EXAM General: Well-developed well-nourished female seen in no acute distress. Left lower extremity: Posterior splint is in place. The left lower extremity is elevated and Polar Care is in place. Good capillary refill. Neurovascular function is intact.  Assessment: Open reduction and internal fixation of a left bimalleolar ankle fracture  Secondary diagnoses: Bobby or heart disease Hyperlipidemia Frequent PVCs Hypertension Chronic low back pain with radicular symptoms  Plan: Notes from physical therapy were reviewed. The patient is making slow but steady progress. Continue with strict nonweightbearing to the left lower extremity. Continue with elevation and use of Polar Care to the left ankle. Anticipate transfer to [facility] for continuation of her rehabilitation. The patient will need an appointment for follow-up with [facility] in 10-14 days.  Clydette Privitera P. Angie FavaHooten, Jr. M.D.

## 2016-09-09 NOTE — Progress Notes (Signed)
Patient is medically stable for D/C to [facility] today. Per Tiffany admissions coordinator at [facility] patient will go to room 501. RN will call report to 500 hall RN and arrange EMS for transport. Clinical Child psychotherapistocial Worker (CSW) sent [facility]C orders [facility] Patient is aware of above. CSW contacted [name] and made him aware of above. Please reconsult if future social work needs arise. CSW signing off.   [employer]iley Kyonna Frier, LCSW [phone]

## 2016-11-10 ENCOUNTER — Telehealth: Payer: Self-pay | Admitting: Cardiology

## 2016-11-10 NOTE — Telephone Encounter (Signed)
Spoke with Brandi HatchetSheila a Nurse with [facility] she states that she was just letting Brandi Scott know that Brandi Scott had an episode of palpitations yesterday(lasted 5-2910minutes), she states that Brandi Scott denies any other symptoms. She also states that Brandi Scott would like to discuss her CHF diagnosis with dr Herbie BaltimoreHarding. Looks like Brandi Scott is due for her annual appointment, she can discuss at that time will call Brandi Scott and schedule.  Spoke with Brandi Scott (nephew) he states to call Brandi Scott (at [phone]) and let her know she is due for annual appt and that he will call back tomorrow and schedule that appt.  Spoke with Brandi Scott she states that she will have nephew call tomorrow and schedule appt to discuss

## 2016-11-10 NOTE — Telephone Encounter (Signed)
New Message      Patient c/o Palpitations:  High priority if patient c/o lightheadedness and shortness of breath.  1. How long have you been having palpitations? Pt has had these on and off. Yesterday 11/09/16 first time in 3 weeks  2. Are you currently experiencing lightheadedness and shortness of breath? No  3. Have you checked your BP and heart rate? (document readings) 110/70 hear trate 88 and regular  4. Are you experiencing any other symptoms? No

## 2017-09-19 IMAGING — CR DG CHEST 2V
1 series · 2 of 2 positions shown · non-contrast
Comparison: January 13, 2014

CLINICAL DATA: Preoperative evaluation for ankle surgery

EXAM:
CHEST  2 VIEW

[Series 1: dg chest 2 view · 0.14mm/px · 2 of 2 slices shown]
[im 1/2]
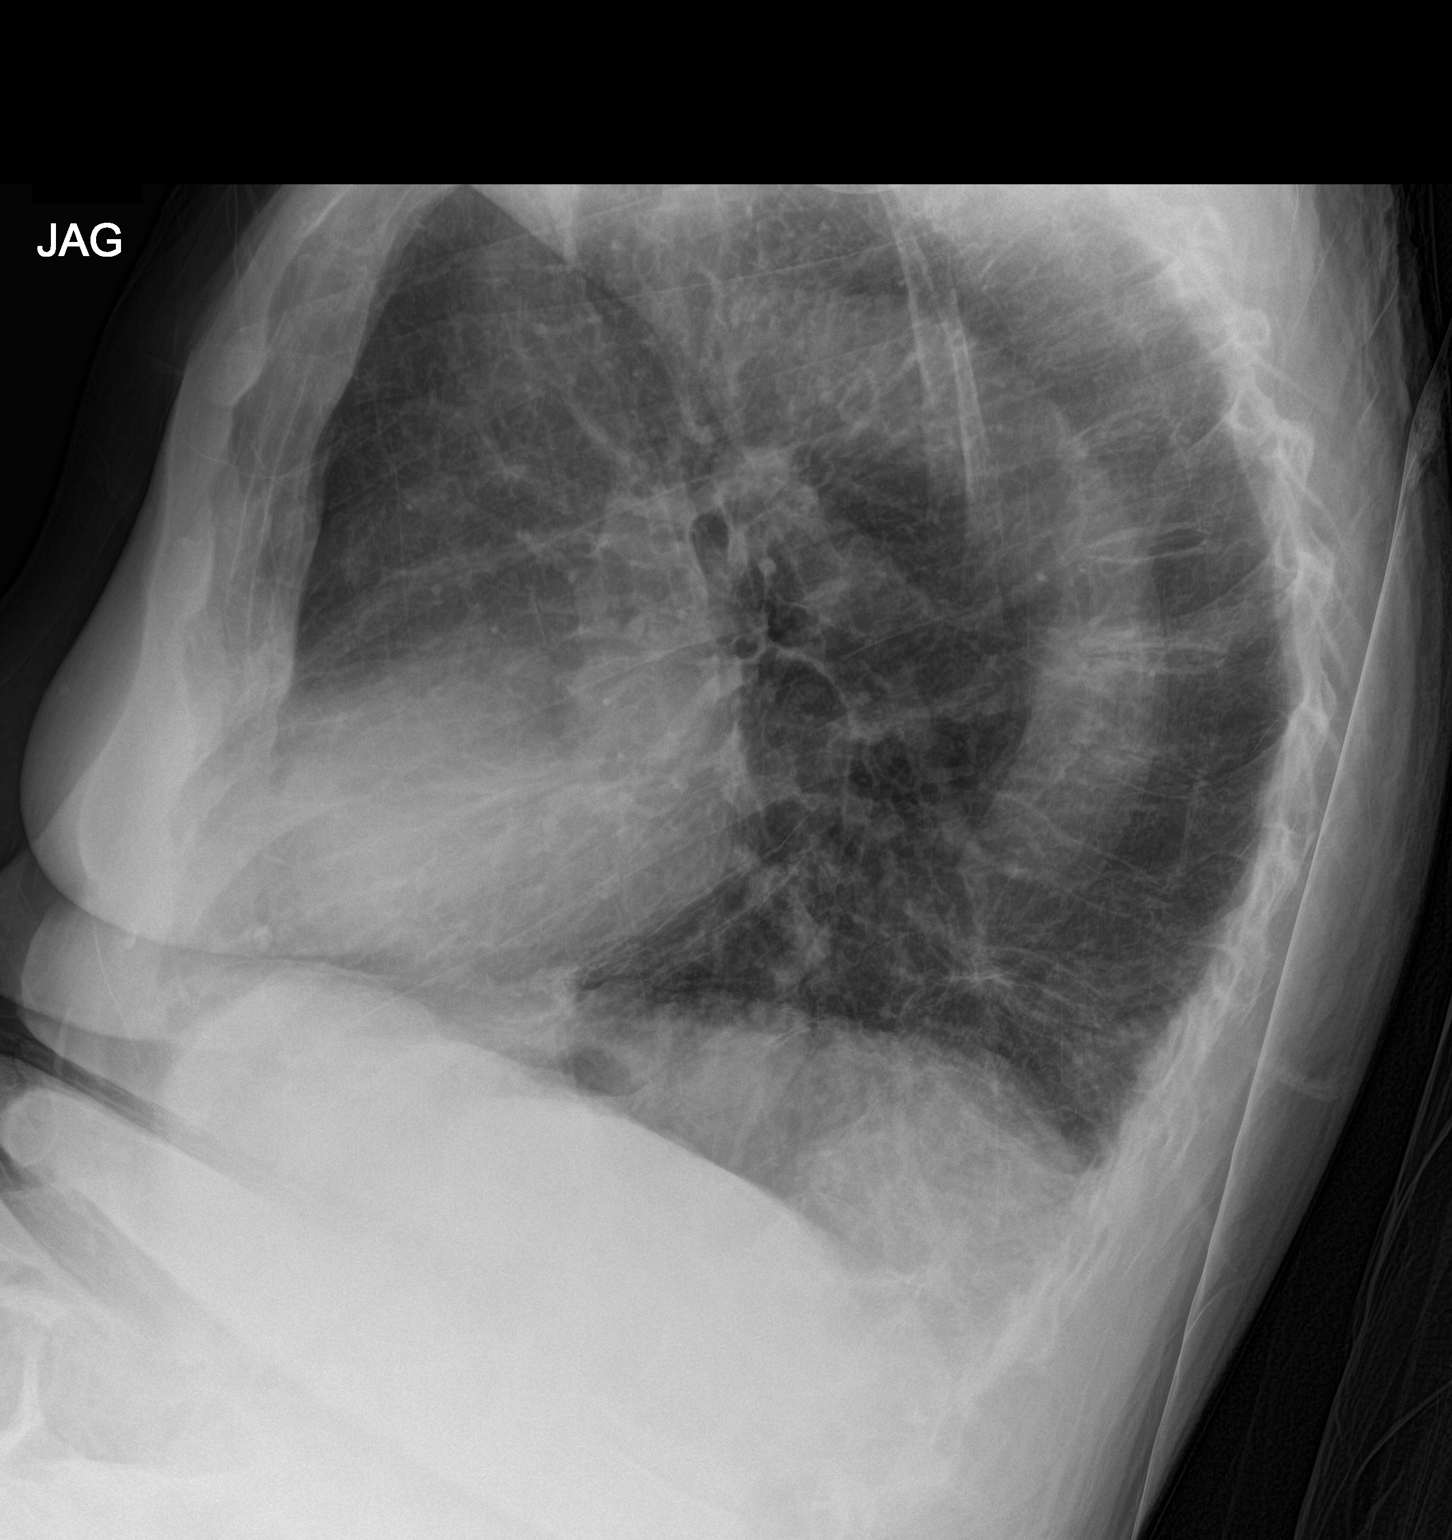
[im 2/2]
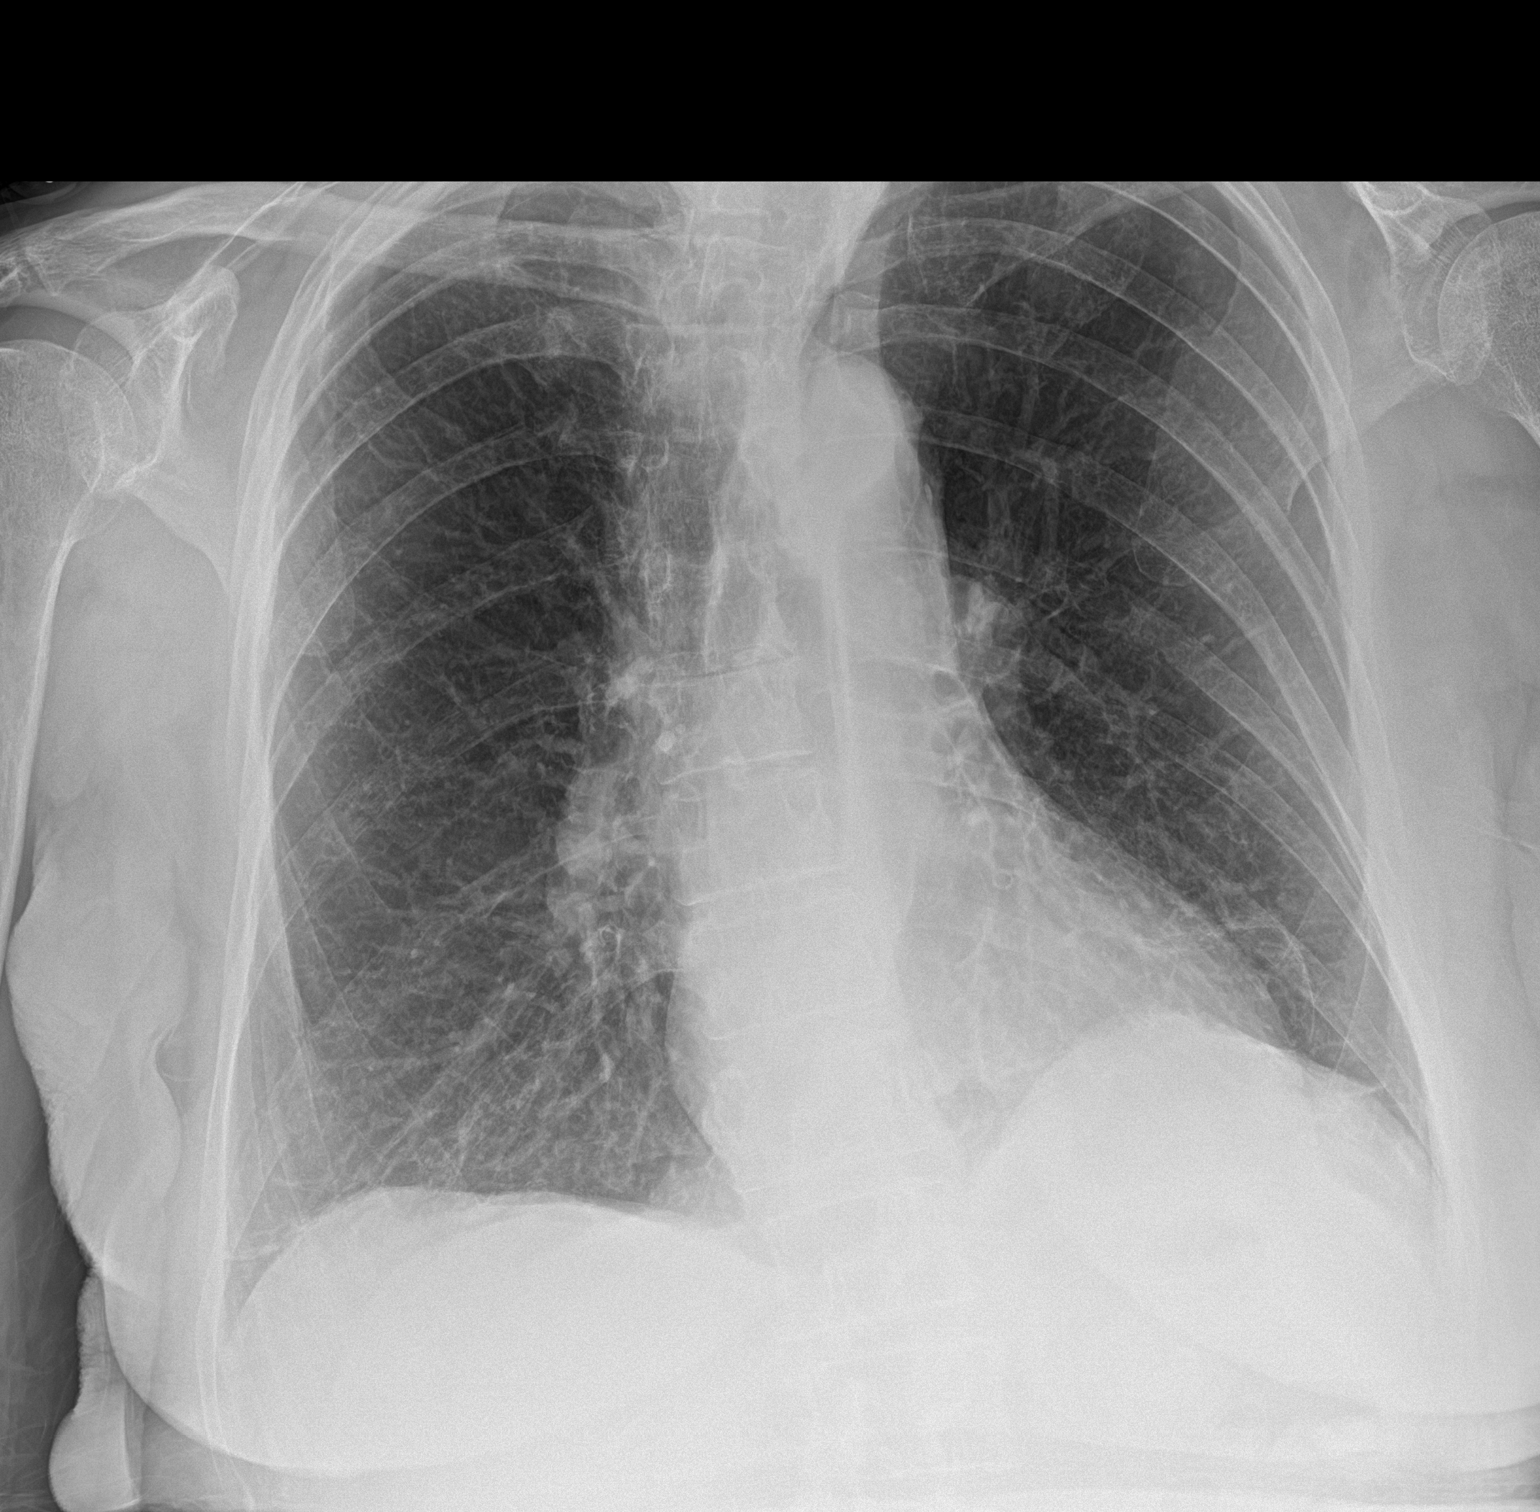

[2 of 2 positions shown; findings below may reference images not displayed]

FINDINGS: There is mild bibasilar scarring. There is no edema or
consolidation. The heart size and pulmonary vascularity are normal.
There is atherosclerotic calcification in the aorta. There is
thoracolumbar levoscoliosis.
IMPRESSION: Mild bibasilar scarring. No edema or consolidation. Aortic
atherosclerosis.

## 2017-11-17 NOTE — Progress Notes (Signed)
Cardiology Office Note  Date:  11/19/2017   ID:  Luvina, Poirier 08-Jun-1929, MRN [id number]  PCP:  [name], MD   Chief Complaint  Patient presents with  . Other    Former Systems developer patient. Patient last seen 2017. Patient c/o racing heart rate and SOB. Meds reviewed verbally with patient.     HPI:  Brandi Scott is a 84 y.o. female with a PMH mild, asymptomatic valvular disease with mild mitral insufficiency  Holter monitor identified PVCs occasionally in bigeminy.   echocardiogram and Myoview back in 2013   echocardiogram in May of 2015 and Myoview in December 2015  improved overall valve function, and still stable nonischemic Exercise Myoview  decreased exercise capacity.  Husband presents with her today Frequent episodes of tachycardia, palpitations, seconds to >30 min Having more in the day Very symptomatic Denies waking up at nighttime with palpitations  On her last clinic visit also reported having palpitations Mild swelling, wears compression hose on a regular basis Left leg greater than right after previous ankle fracture with surgery  Previous cardiology notes indicate prior monitor/Holter but this is not in the system  EKG personally reviewed by myself on todays visit Shows normal sinus rhythm rate 75 bpm no significant ST or T wave changes  Past medical history reviewed  September  2016.  one spell of rapid heart rates that lasted about 15 minutes triggered by a very frightful situation, having seen a snake in her house.   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  PSH:    Past Surgical History:  Procedure Laterality Date  . BREAST EXCISIONAL BIOPSY Right 1971, 1981   negative  . COLONOSCOPY    . Holter Monitor  May 2013   @ [facility] Cardiology: Occasional PVCs (benign)  . NM MYOVIEW  LTD  May 2013; Dec 2015   @ [facility] Cardiology: a) 2013: Exercise for 2:30 min (only), hypertensive response no ischemic changes. He has 74%, normal wall motion, no evidence of ischemia or infarction.; b) Ex 3:00 min (dyspnea) 3.3 METs HR 146 (106% MPHR) - no ECG changes, E 70%, no RWMA, No ischemia or infarction  . ORIF ANKLE FRACTURE Left 09/07/2016   Procedure: OPEN REDUCTION INTERNAL FIXATION (ORIF) ANKLE FRACTURE;  Surgeon: Donato Heinz, MD;  Location: [facility] ORS;  Service: Orthopedics;  Laterality: Left;  . TONGUE BIOPSY    . TONSILLECTOMY    . TOTAL ABDOMINAL HYSTERECTOMY W/ BILATERAL SALPINGOOPHORECTOMY    . TRANSTHORACIC ECHOCARDIOGRAM  May 2013; May 2015   @ [facility] Cardiology: a) 2013 : Normal LV function, EF 65%, mild BiAE, Mild LVH, Mod MR, Mod-Severe TR; b) normal LV size, EF> 55%, mild LVH, mild MR, mild TR and    Current Outpatient Medications  Medication Sig Dispense Refill  . aspirin EC 81 MG tablet Take 81 mg by mouth at bedtime.    . Calcium Carb-Cholecalciferol (CALCIUM 600+D) 600-800 MG-UNIT TABS Take 1 tablet by mouth daily.    . Cholecalciferol (D 1000) 1000 units capsule Take by mouth.    [name] lisinopril (PRINIVIL,ZESTRIL) 20 MG tablet Take 20 mg by mouth daily with lunch.     . metoprolol tartrate (LOPRESSOR) 25 MG tablet Take 25 mg by mouth 2 (two) times daily.    . vitamin E 400 UNIT capsule Take 400 Units by mouth daily.  No current facility-administered medications for this visit.      Allergies:   Patient has no known allergies.   Social History:  The patient  reports that  has never smoked. she has never used smokeless tobacco. She reports that she does not drink alcohol or use drugs.   Family History:   family history includes Alzheimer's disease in her mother; Heart attack in her father; Heart disease in her brother and maternal grandmother; Heart failure in her mother; Hypertension in her father; Stomach cancer in her brother.     Review of Systems: Review of Systems  Constitutional: Negative.   Respiratory: Negative.   Cardiovascular: Negative.   Gastrointestinal: Negative.   Musculoskeletal: Negative.   Neurological: Negative.   Psychiatric/Behavioral: Negative.   All other systems reviewed and are negative.    PHYSICAL EXAM: VS:  BP (!) 150/90 (BP Location: Left Arm, Patient Position: Sitting, Cuff Size: Normal)   Pulse 75   Ht 5\' 5"  (1.651 m)   Wt 160 lb 8 oz (72.8 kg)   BMI 26.71 kg/m  , BMI Body mass index is 26.71 kg/m. GEN: Well nourished, well developed, in no acute distress  HEENT: normal  Neck: no JVD, carotid bruits, or masses Cardiac: RRR; no murmurs, rubs, or gallops,no edema  Respiratory:  clear to auscultation bilaterally, normal work of breathing GI: soft, nontender, nondistended, + BS MS: no deformity or atrophy  Skin: warm and dry, no rash Neuro:  Strength and sensation are intact Psych: euthymic mood, full affect    Recent Labs: No results found for requested labs within last 8760 hours.    Lipid Panel No results found for: CHOL, HDL, LDLCALC, TRIG    Wt Readings from Last 3 Encounters:  11/19/17 160 lb 8 oz (72.8 kg)  09/07/16 142 lb (64.4 kg)  12/11/15 154 lb (69.9 kg)       ASSESSMENT AND PLAN:  Paroxysmal tachycardia (HCC) Worsening symptoms over the past year happening for longer Event monitor ordered to rule out atrial fibrillation Also now with dizziness/near syncope when she has episodes  Essential hypertension - Blood pressure markedly elevated today even on repeat check by myself Possibly from anxiety being in the office Suggested she monitor blood pressure at home and call us with some numbers Pressure on my check 160/90 Previously with primary care physician was 130/80 Suggested if blood pressure does run high on a consistent basis we could increase lisinopril and increase metoprolol Her weight has trended upwards it would seem over the past  2 years  Frequent PVCs -  Unclear if symptoms are secondary to ectopy or other etiology such as atrial fibrillation/flutter.  Event monitor ordered  Mild mitral and tricuspid regurgitation on recent echo - Likely of little clinical significance No further workup at this time  Asymptomatic varicose veins of bilateral lower extremities: Mild lower extremity edema Wears compression hose likely venous insufficiency  Palpitations As below, etiology unclear Happening more and for longer periods of time Event monitor ordered  Dizziness In the setting of her tachycardia and palpitations Event monitor has been ordered to rule out atrial fibrillation This will be placed in the office today, instructions have been provided on how to activate and processing orders   Disposition:   F/U  12 months as needed    Total encounter time more than 45 minutes  Greater than 50% was spent in counseling and coordination of care with the patient    Orders Placed This Encounter  Procedures  .  LONG TERM MONITOR (3-14 DAYS)  . EKG 12-Lead     Signed, Dossie Arbourim Glenetta Kiger, M.D., Ph.D. 11/19/2017  [facility], [place] [phone]

## 2017-11-18 ENCOUNTER — Ambulatory Visit: Payer: Medicare Other | Admitting: Cardiovascular Disease

## 2017-11-19 ENCOUNTER — Ambulatory Visit: Payer: Medicare Other | Admitting: Cardiovascular Disease

## 2017-11-19 ENCOUNTER — Ambulatory Visit (INDEPENDENT_AMBULATORY_CARE_PROVIDER_SITE_OTHER): Payer: Medicare Other

## 2017-11-19 VITALS — BP 150/90 | HR 75 | Ht 65.0 in | Wt 160.5 lb

## 2017-11-19 DIAGNOSIS — I8393 Asymptomatic varicose veins of bilateral lower extremities: Secondary | ICD-10-CM

## 2017-11-19 DIAGNOSIS — I493 Ventricular premature depolarization: Secondary | ICD-10-CM

## 2017-11-19 DIAGNOSIS — R42 Dizziness and giddiness: Secondary | ICD-10-CM | POA: Insufficient documentation

## 2017-11-19 DIAGNOSIS — I10 Essential (primary) hypertension: Secondary | ICD-10-CM

## 2017-11-19 DIAGNOSIS — I38 Endocarditis, valve unspecified: Secondary | ICD-10-CM

## 2017-11-19 DIAGNOSIS — I479 Paroxysmal tachycardia, unspecified: Secondary | ICD-10-CM | POA: Diagnosis not present

## 2017-11-19 DIAGNOSIS — R002 Palpitations: Secondary | ICD-10-CM | POA: Diagnosis not present

## 2017-11-19 NOTE — Patient Instructions (Addendum)
Please monitor blood pressure Goal is <140 on the top <90 on the bottom   Medication Instructions:   No medication changes made  Labwork:  No new labs needed  Testing/Procedures:  We will schedule a zio monitor for tachycardia and palpitations, near syncope/dizziness   Follow-Up: It was a pleasure seeing you in the office today. Please call us if you have new issues that need to be addressed before your next appt.  [phone]  Your physician wants you to follow-up in: 12 months as needed.  You will receive a reminder letter in the mail two months in advance. If you don't receive a letter, please call our office to schedule the follow-up appointment.  If you need a refill on your cardiac medications before your next appointment, please call your pharmacy.  For educational health videos Log in to : [email or web address] Or : [email or web address], password : [email or web address]

## 2017-11-24 ENCOUNTER — Telehealth: Payer: Self-pay | Admitting: Cardiovascular Disease

## 2017-11-24 DIAGNOSIS — I493 Ventricular premature depolarization: Secondary | ICD-10-CM | POA: Diagnosis not present

## 2017-11-24 DIAGNOSIS — I10 Essential (primary) hypertension: Secondary | ICD-10-CM | POA: Diagnosis not present

## 2017-11-24 DIAGNOSIS — I8393 Asymptomatic varicose veins of bilateral lower extremities: Secondary | ICD-10-CM

## 2017-11-24 DIAGNOSIS — I38 Endocarditis, valve unspecified: Secondary | ICD-10-CM

## 2017-11-24 NOTE — Telephone Encounter (Signed)
Pt is coming in 11/26/17 to get new monitor

## 2017-11-24 NOTE — Telephone Encounter (Signed)
Patient monitor partially came off and she is seeing a flashing red light .  Please call to discuss

## 2017-11-24 NOTE — Telephone Encounter (Signed)
Spoke with patient and she reports that the monitor came off and it has a red flashing light. She called company and they tried to restick it but it would not hold. Instructed her to mail monitor back in and that I would have someone from scheduling give her a call to get another one placed. She verbalized understanding with no further questions at this time.

## 2017-11-24 NOTE — Telephone Encounter (Signed)
Spoke to patient, she will call back to schedule.

## 2017-11-25 NOTE — Telephone Encounter (Signed)
[name], Do you want me to add her to the schedule?

## 2017-11-26 NOTE — Telephone Encounter (Signed)
New zio monitor [id number] placed on patient and instructed to wear for 10 days.  Patient has mailed in first zio monitor. Information added in to [employer].

## 2017-12-14 ENCOUNTER — Telehealth: Payer: Self-pay | Admitting: Cardiovascular Disease

## 2017-12-14 ENCOUNTER — Other Ambulatory Visit: Payer: Self-pay | Admitting: *Deleted

## 2017-12-14 MED ORDER — METOPROLOL TARTRATE 50 MG PO TABS: 50.0000 mg | ORAL_TABLET | Freq: Two times a day (BID) | ORAL | 3 refills | Status: DC

## 2017-12-14 MED ORDER — APIXABAN 5 MG PO TABS: 5.0000 mg | ORAL_TABLET | Freq: Two times a day (BID) | ORAL | 2 refills | Status: DC

## 2017-12-14 NOTE — Telephone Encounter (Signed)
Pt calling stating she was just on the phone with a nurse Stating we prescribed a new Blood thinner for her today  She is calling for she is also on a low dose of Aspirin  She wants to know if she started the blood thinner will she need to stop the aspirin  Please call back

## 2017-12-14 NOTE — Telephone Encounter (Signed)
Would stop the aspirin

## 2017-12-14 NOTE — Telephone Encounter (Signed)
Advised patient that I will get advice from Dr Mariah MillingGollan concerning this for her. She was appreciative. Routing to Dr Mariah MillingGollan for advice as to whether patient should continue aspirin with beginning Eliquis for newly documented aFib on longterm monitor.

## 2017-12-15 NOTE — Telephone Encounter (Signed)
Spoke with patient and reviewed Dr. Windell HummingbirdGollan's recommendations to hold aspirin. She verbalized understanding with no further questions at this time.

## 2017-12-29 NOTE — Progress Notes (Signed)
Cardiology Office Note  Date:  12/30/2017   ID:  Brandi Scott Verba, DOB Mar 17, 1929, MRN [id number]  PCP:  Jerl MinaHedrick, James, MD   Chief Complaint  Patient presents with  . other    Follow up event monitor. Pt. c/o blood from rectum when she wipes, shortness of breath with having a spell of rapid irreg. heart beats.  Meds reviewed by the pt. verbally.     HPI:  Brandi Scott Kalla is a 84 y.o. female with a PMH mild, asymptomatic valvular disease with mild mitral insufficiency  Holter monitor identified PVCs occasionally in bigeminy.   echocardiogram and Myoview back in 2013   echocardiogram in May of 2015 and Myoview in December 2015  improved overall valve function, and still stable nonischemic Exercise Myoview  decreased exercise capacity.  On her last clinic visit reported having episodes of tachycardia and palpitations sometimes lasting up to more than 30 minutes  was very symptomatic Event monitor ordered 46 Supraventricular Tachycardia runs occurred,  the longest lasting 19 beats with an avg rate of 110 bpm.   Atrial Fibrillation occurred (<1% burden), ranging from 98-172 bpm (avg of 142 bpm), the longest lasting 15 mins 14 secs with an avg rate of 144 bpm. Atrial Fibrillation was detected within +/- 45 seconds of symptomatic patient event(s).  One episode of atrial fibrillation 2 hours Overall better  Hemorrhoids are acting up but not bad Long discussion concerning the cost of Eliquis  Mild swelling left leg greater than right after previous ankle fracture with surgery  EKG personally reviewed by myself on todays visit Shows normal sinus rhythm rate 75 bpm no significant ST or T wave changes  Past medical history reviewed  September  2016.  one spell of rapid heart rates that lasted about 15 minutes triggered by a very frightful situation, having seen a snake in her house.   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  PSH:    Past Surgical History:  Procedure Laterality Date  . BREAST EXCISIONAL BIOPSY Right 1971, 1981   negative  . COLONOSCOPY    . Holter Monitor  May 2013   @ [facility] Cardiology: Occasional PVCs (benign)  . NM MYOVIEW LTD  May 2013; Dec 2015   @ [facility] Cardiology: a) 2013: Exercise for 2:30 min (only), hypertensive response no ischemic changes. He has 74%, normal wall motion, no evidence of ischemia or infarction.; b) Ex 3:00 min (dyspnea) 3.3 METs HR 146 (106% MPHR) - no ECG changes, E 70%, no RWMA, No ischemia or infarction  . ORIF ANKLE FRACTURE Left 09/07/2016   Procedure: OPEN REDUCTION INTERNAL FIXATION (ORIF) ANKLE FRACTURE;  Surgeon: Donato HeinzJames P Hooten, MD;  Location: [facility] ORS;  Service: Orthopedics;  Laterality: Left;  . TONGUE BIOPSY    . TONSILLECTOMY    . TOTAL ABDOMINAL HYSTERECTOMY W/ BILATERAL SALPINGOOPHORECTOMY    . TRANSTHORACIC ECHOCARDIOGRAM  May 2013; May 2015   @ [facility] Cardiology: a) 2013 : Normal LV function, EF 65%, mild BiAE, Mild LVH, Mod MR, Mod-Severe TR; b) normal LV size, EF> 55%, mild LVH, mild MR, mild TR and    Current Outpatient Medications  Medication Sig Dispense Refill  . Calcium Carb-Cholecalciferol (CALCIUM 600+D) 600-800 MG-UNIT TABS Take 1 tablet by mouth daily.    . Cholecalciferol (D 1000) 1000 units capsule Take 2,000 Units by mouth daily.     .[name]  lisinopril (PRINIVIL,ZESTRIL) 20 MG tablet Take 20 mg by mouth daily with lunch.     . metoprolol tartrate (LOPRESSOR) 50 MG tablet Take 1 tablet (50 mg total) by mouth 2 (two) times daily. 180 tablet 3  . vitamin E 400 UNIT capsule Take 400 Units by mouth daily.    [name] apixaban (ELIQUIS) 5 MG TABS tablet Take 1 tablet (5 mg total) by mouth 2 (two) times daily. 180 tablet 3   No current facility-administered medications for this visit.      Allergies:   Patient has no known  allergies.   Social History:  The patient  reports that she has never smoked. She has never used smokeless tobacco. She reports that she does not drink alcohol or use drugs.   Family History:   family history includes Alzheimer's disease in her mother; Heart attack in her father; Heart disease in her brother and maternal grandmother; Heart failure in her mother; Hypertension in her father; Stomach cancer in her brother.    Review of Systems: Review of Systems  Constitutional: Negative.   Respiratory: Negative.   Cardiovascular: Positive for palpitations.  Gastrointestinal: Negative.   Musculoskeletal: Negative.   Neurological: Negative.   Psychiatric/Behavioral: Negative.   All other systems reviewed and are negative.    PHYSICAL EXAM: VS:  BP 130/80 (BP Location: Left Arm, Patient Position: Sitting, Cuff Size: Normal)   Pulse 75   Ht 5\' 5"  (1.651 m)   Wt 159 lb 8 oz (72.3 kg)   BMI 26.54 kg/m  , BMI Body mass index is 26.54 kg/m. Constitutional:  oriented to person, place, and time. No distress.  HENT:  Head: Normocephalic and atraumatic.  Eyes:  no discharge. No scleral icterus.  Neck: Normal range of motion. Neck supple. No JVD present.  Cardiovascular: Normal rate, regular rhythm, normal heart sounds and intact distal pulses. Exam reveals no gallop and no friction rub. No edema No murmur heard. Pulmonary/Chest: Effort normal and breath sounds normal. No stridor. No respiratory distress.  no wheezes.  no rales.  no tenderness.  Abdominal: Soft.  no distension.  no tenderness.  Musculoskeletal: Normal range of motion.  no  tenderness or deformity.  Neurological:  normal muscle tone. Coordination normal. No atrophy Skin: Skin is warm and dry. No rash noted. not diaphoretic.  Psychiatric:  normal mood and affect. behavior is normal. Thought content normal.   Recent Labs: No results found for requested labs within last 8760 hours.    Lipid Panel No results found for:  CHOL, HDL, LDLCALC, TRIG    Wt Readings from Last 3 Encounters:  12/30/17 159 lb 8 oz (72.3 kg)  11/19/17 160 lb 8 oz (72.8 kg)  09/07/16 142 lb (64.4 kg)       ASSESSMENT AND PLAN:  Paroxysmal atrial fibrillation Tolerating Eliquis 5mg   twice daily Metoprolol tartrate 50 twice daily Long discussion concerning the price of Eliquis, recommended mail order and patient assistance Recommended extra metoprolol 25 mg if she has breakthrough arrhythmia We might need 75 twice daily for frequent breakthrough  Essential hypertension - Blood pressure is well controlled on today's visit. No changes made to the medications.  Frequent PVCs -   asymptomatic  On beta-blocker  Mild mitral and tricuspid regurgitation on recent echo - Likely of little clinical significance No further workup at this time, stable  Asymptomatic varicose veins of bilateral lower extremities: Suspect component of venous insufficiency Wears compression hose , stable  Dizziness Denies having significant dizziness Sometimes has  headache when she is in atrial fibrillation   Disposition:   F/U 6 months   Total encounter time more than 25 minutes  Greater than 50% was spent in counseling and coordination of care with the patient    Orders Placed This Encounter  Procedures  . EKG 12-Lead     Signed, Dossie Arbour, M.D., Ph.D. 12/30/2017  [facility] [place], [place] [phone]

## 2017-12-30 ENCOUNTER — Encounter: Payer: Self-pay | Admitting: Cardiovascular Disease

## 2017-12-30 ENCOUNTER — Ambulatory Visit: Payer: Medicare Other | Admitting: Cardiovascular Disease

## 2017-12-30 VITALS — BP 130/80 | HR 75 | Ht 65.0 in | Wt 159.5 lb

## 2017-12-30 DIAGNOSIS — I38 Endocarditis, valve unspecified: Secondary | ICD-10-CM | POA: Diagnosis not present

## 2017-12-30 DIAGNOSIS — R42 Dizziness and giddiness: Secondary | ICD-10-CM

## 2017-12-30 DIAGNOSIS — I493 Ventricular premature depolarization: Secondary | ICD-10-CM | POA: Diagnosis not present

## 2017-12-30 DIAGNOSIS — I480 Paroxysmal atrial fibrillation: Secondary | ICD-10-CM | POA: Diagnosis not present

## 2017-12-30 DIAGNOSIS — I10 Essential (primary) hypertension: Secondary | ICD-10-CM | POA: Diagnosis not present

## 2017-12-30 DIAGNOSIS — I8393 Asymptomatic varicose veins of bilateral lower extremities: Secondary | ICD-10-CM

## 2017-12-30 DIAGNOSIS — R002 Palpitations: Secondary | ICD-10-CM | POA: Diagnosis not present

## 2017-12-30 DIAGNOSIS — I479 Paroxysmal tachycardia, unspecified: Secondary | ICD-10-CM | POA: Diagnosis not present

## 2017-12-30 MED ORDER — APIXABAN 5 MG PO TABS: 5.0000 mg | ORAL_TABLET | Freq: Two times a day (BID) | ORAL | 3 refills | Status: DC

## 2017-12-30 MED ORDER — APIXABAN 5 MG PO TABS: 5.0000 mg | ORAL_TABLET | Freq: Two times a day (BID) | ORAL | 0 refills | Status: DC

## 2017-12-30 NOTE — Patient Instructions (Addendum)
Medication Instructions:   Please take extra 1/2 metoprolol for atrial fibrillation  Check price of eliquis with [employer]:  [id number]  Labwork:  No new labs needed  Testing/Procedures:  No further testing at this time   Follow-Up: It was a pleasure seeing you in the office today. Please call us if you have new issues that need to be addressed before your next appt.  [phone]  Your physician wants you to follow-up in: 6 months.  You will receive a reminder letter in the mail two months in advance. If you don't receive a letter, please call our office to schedule the follow-up appointment.  If you need a refill on your cardiac medications before your next appointment, please call your pharmacy.  For educational health videos Log in to : [email or web address] Or : [email or web address], password : [email or web address]

## 2018-03-02 ENCOUNTER — Telehealth: Payer: Self-pay | Admitting: Cardiovascular Disease

## 2018-03-02 NOTE — Telephone Encounter (Signed)
Fax received from [employer] stating that the patient has been approved for patient assistance for Eliquis from 02/08/18-09/27/18.

## 2018-08-04 ENCOUNTER — Telehealth: Payer: Self-pay | Admitting: Cardiovascular Disease

## 2018-08-04 NOTE — Telephone Encounter (Signed)
Patient brought in physician portion of application for medication assistance with Alver Fisher .  Placed in nurse box with copy of insurance cards.  Patient will bring back her part , medication expense report, proof of ssa

## 2018-08-10 NOTE — Telephone Encounter (Signed)
Patient dropped off rest of forms Placed in nurse box

## 2018-08-11 NOTE — Telephone Encounter (Signed)
Spoke with patient and reviewed that we are not able to process her assistance paperwork until the beginning of the year due to needing her statements and other information. Advised that I would mail her a information sheet so that she will know exactly what we will need to process paperwork for the new year. She was appreciative for the call with no further questions at this time. Forms placed in "Patient Assistance Applications 2020 Pending" above Brandi Scott's desk.

## 2018-08-12 ENCOUNTER — Telehealth: Payer: Self-pay | Admitting: Cardiovascular Disease

## 2018-08-12 NOTE — Telephone Encounter (Signed)
Pt nephew came by and would like a call regarding Eliquis assistance. States pt will run out in the middle of December. Please call to discuss. Pt number call 1st, then nephew Gery PrayBarry, [phone], ok to leave detailed message

## 2018-08-17 NOTE — Telephone Encounter (Signed)
I called and spoke with the patient's nephew, Gery PrayBarry (ok per [facility]). I have advised him that we cannot submit the patient's paperwork for 2020 until the first of the year. However, I have pulled samples for the patient to have on hand. Gery PrayBarry is aware these will be placed at the front desk for he/ the patient to pick up at their convenience.  Gery PrayBarry is agreeable with the above plan and voices understanding.   Eliquis 5mg  samples given Lot: [id number] Exp: 07/2020 # 3 boxes given

## 2018-08-22 NOTE — Telephone Encounter (Signed)
Patient called to let us know samples not needed because she received an unexpected shipment.  Please return for someone else to use.

## 2018-08-24 ENCOUNTER — Ambulatory Visit: Payer: Medicare Other | Admitting: Cardiovascular Disease

## 2018-09-08 NOTE — Telephone Encounter (Signed)
Patient assistance application for 2020 faxed to assistance program.

## 2018-09-15 ENCOUNTER — Ambulatory Visit: Payer: Medicare Other | Admitting: Podiatry

## 2018-09-15 ENCOUNTER — Encounter: Payer: Self-pay | Admitting: Podiatry

## 2018-09-15 VITALS — BP 155/91 | HR 83

## 2018-09-15 DIAGNOSIS — B351 Tinea unguium: Secondary | ICD-10-CM | POA: Diagnosis not present

## 2018-09-15 DIAGNOSIS — M79674 Pain in right toe(s): Secondary | ICD-10-CM

## 2018-09-15 DIAGNOSIS — M2041 Other hammer toe(s) (acquired), right foot: Secondary | ICD-10-CM

## 2018-09-15 DIAGNOSIS — M2042 Other hammer toe(s) (acquired), left foot: Secondary | ICD-10-CM

## 2018-09-15 DIAGNOSIS — D689 Coagulation defect, unspecified: Secondary | ICD-10-CM

## 2018-09-15 NOTE — Progress Notes (Signed)
This patient presents the office with chief complaint of painful second toe right foot she says the toenail on the second toe right foot is thick and long and growing over the tip of her toe she denies any drainage from the site she says this toenail is aggravated walking and wearing her shoes she also says she has bad hammertoes especially the second toe of the right foot she previously has tried padding but does not have any padding today she presents the office today for an evaluation and treatment of her feet  Vascular  Dorsalis pedis and posterior tibial pulses are palpable  B/L.  Capillary return  WNL.  Temperature gradient is  WNL.  Skin turgor  WNL  Sensorium  Senn Weinstein monofilament wire  WNL. Normal tactile sensation.  Nail Exam  Patient has normal nails with no evidence of bacterial or fungal infection with the exception second toenail right foot.  Orthopedic  Exam  Muscle tone and muscle strength  WNL.  No limitations of motion feet  B/L.  No crepitus or joint effusion noted.  Foot type is unremarkable and digits show no abnormalities.  HAV  B/L.  Hammer toes  2-4  B/l  Skin  No open lesions.  Normal skin texture and turgor.  Onychomycosis right foot  Hammer toes  B/L  IE  Debride nails.  Discussed her hammer toes and dispensed padding  for right foot.  RTC 4 months.   Helane GuntherGregory Zadkiel Dragan DPM

## 2018-09-30 NOTE — Telephone Encounter (Signed)
Cleared out sample box at the front desk. Eliquis samples not picked up (see 08/22/18 documentation below)- returned to samples closet.   Returning samples: Eliquis 5 mg Lot: [id number] Exp: 11/21 # 3 boxes

## 2018-11-04 ENCOUNTER — Telehealth: Payer: Self-pay | Admitting: Cardiovascular Disease

## 2018-11-04 NOTE — Telephone Encounter (Signed)
Patients son brought in completed forms for patient assistance with her medication. Complete application completed and faxed to assistance program. Will file in sample closet if needed again.

## 2018-11-07 ENCOUNTER — Other Ambulatory Visit: Payer: Self-pay | Admitting: Family Medicine

## 2018-12-06 ENCOUNTER — Ambulatory Visit (INDEPENDENT_AMBULATORY_CARE_PROVIDER_SITE_OTHER): Payer: Medicare Other | Admitting: Nurse Practitioner

## 2018-12-06 ENCOUNTER — Encounter: Payer: Self-pay | Admitting: Nurse Practitioner

## 2018-12-06 VITALS — BP 124/78 | HR 78 | Ht 65.0 in | Wt 159.5 lb

## 2018-12-06 DIAGNOSIS — I480 Paroxysmal atrial fibrillation: Secondary | ICD-10-CM | POA: Diagnosis not present

## 2018-12-06 DIAGNOSIS — R002 Palpitations: Secondary | ICD-10-CM | POA: Diagnosis not present

## 2018-12-06 DIAGNOSIS — I493 Ventricular premature depolarization: Secondary | ICD-10-CM | POA: Diagnosis not present

## 2018-12-06 DIAGNOSIS — I10 Essential (primary) hypertension: Secondary | ICD-10-CM | POA: Diagnosis not present

## 2018-12-06 NOTE — Patient Instructions (Signed)
Medication Instructions:  Your physician recommends that you continue on your current medications as directed. Please refer to the Current Medication list given to you today. Eliquis 5 mg sample provided.   If you need a refill on your cardiac medications before your next appointment, please call your pharmacy.   Lab work: Your physician recommends that you have lab work today(CBC)   If you have labs (blood work) drawn today and your tests are completely normal, you will receive your results only by: [name] MyChart Message (if you have MyChart) OR . A paper copy in the mail If you have any lab test that is abnormal or we need to change your treatment, we will call you to review the results.  Testing/Procedures: None ordered   Follow-Up: At [facility], you and your health needs are our priority.  As part of our continuing mission to provide you with exceptional heart care, we have created designated Provider Care Teams.  These Care Teams include your primary Cardiologist (physician) and Advanced Practice Providers (APPs -  Physician Assistants and Nurse Practitioners) who all work together to provide you with the care you need, when you need it. You will need a follow up appointment in 6 months.  Please call our office 2 months in advance to schedule this appointment.  You may see [name], MD or Nicolasa Ducking, NP

## 2018-12-06 NOTE — Progress Notes (Signed)
Office Visit    Patient Name: Brandi Scott Date of Encounter: 12/06/2018  Primary Care Provider:  [name], MD Primary Cardiologist:  [name], MD  Chief Complaint    85 y/o ? with a h/o paroxysmal atrial fibrillation, palpitations/PVCs, hypertension, hyperlipidemia, and valvular heart disease, who presents for follow-up related to paroxysmal atrial fibrillation.  Past Medical History    Past Medical History:  Diagnosis Date  . Chronic radicular low back pain   . Essential hypertension   . 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 History of stress test    a. 01/2012 MV: EF 74%, no ischemia.  . Hyperlipidemia   . Osteoarthritis of both hips    And knees as well as back.   [name] PAF (paroxysmal atrial fibrillation) (HCC)    a. 10/2017 Event monitor: 46 SVTs (longest 19 beats, avg 110 bpm). Afib (<1% burden w/ avg HR 142)-->Eliquis (CHA2DS2VASc = 4).  . Valvular heart disease    a. 2013 Echo: Moderate MR and moderate-severe TR on echocardiogram; b. Echo 2015: mild MR & TR. Nl EF, mild LVH.   Past Surgical History:  Procedure Laterality Date  . BREAST EXCISIONAL BIOPSY Right 1971, 1981   negative  . COLONOSCOPY    . Holter Monitor  May 2013   @ [facility] Cardiology: Occasional PVCs (benign)  . NM MYOVIEW LTD  May 2013; Dec 2015   @ [facility] Cardiology: a) 2013: Exercise for 2:30 min (only), hypertensive response no ischemic changes. He has 74%, normal wall motion, no evidence of ischemia or infarction.; b) Ex 3:00 min (dyspnea) 3.3 METs HR 146 (106% MPHR) - no ECG changes, E 70%, no RWMA, No ischemia or infarction  . ORIF ANKLE FRACTURE Left 09/07/2016   Procedure: OPEN REDUCTION INTERNAL FIXATION (ORIF) ANKLE FRACTURE;  Surgeon: Donato Heinz, MD;  Location: [facility] ORS;  Service: Orthopedics;  Laterality: Left;  . TONGUE BIOPSY    . TONSILLECTOMY    . TOTAL ABDOMINAL HYSTERECTOMY W/ BILATERAL  SALPINGOOPHORECTOMY    . TRANSTHORACIC ECHOCARDIOGRAM  May 2013; May 2015   @ [facility] Cardiology: a) 2013 : Normal LV function, EF 65%, mild BiAE, Mild LVH, Mod MR, Mod-Severe TR; b) normal LV size, EF> 55%, mild LVH, mild MR, mild TR and    Allergies  No Known Allergies  History of Present Illness    85 year old female with above past medical history including paroxysmal atrial fibrillation, palpitations/PVCs, hypertension, hyperlipidemia, and mild valvular heart disease.  She previously underwent stress testing in 2013 which was nonischemic.  Her last echocardiogram was in June 2015 which showed mild MR, TR, PR, and trivial aortic insufficiency.  In February 2019, in the setting of palpitations, she underwent event monitoring which showed 46 brief episodes of SVT and also paroxysmal atrial fibrillation with less than 1% burden.  She was placed on Eliquis therapy and maintained on beta-blocker.  She was last seen in clinic in April 2019, at which time she was doing well.  Since her last visit, she is continued to do well.  She is not particularly active in the setting of chronic back and right hip pain, but denies experiencing chest pain, palpitations, dyspnea, PND, orthopnea, dizziness, syncope, or early satiety.  She does experience mild lower extremity swelling which is well controlled with TED stockings.  Home Medications    Prior to Admission medications   Medication Sig Start Date End Date Taking?  Authorizing Provider  apixaban (ELIQUIS) 5 MG TABS tablet every 12 (twelve) hours 12/14/17   [provider]  Calcium Carb-Cholecalciferol (CALCIUM 600+D) 600-800 MG-UNIT TABS Take 1 tablet by mouth daily.    [provider]  Cholecalciferol (D 1000) 1000 units capsule Take 2,000 Units by mouth daily.     [provider]  lisinopril (PRINIVIL,ZESTRIL) 20 MG tablet TAKE 1 TABLET BY MOUTH ONCE DAILY 07/19/18   [provider]  metoprolol tartrate  (LOPRESSOR) 50 MG tablet Take 1 tablet (50 mg total) by mouth 2 (two) times daily. 12/14/17 03/14/18  Antonieta Iba, MD  vitamin E 400 UNIT capsule Take 400 Units by mouth daily.    [provider]    Review of Systems    Chronic back and right hip pain.  She denies chest pain, palpitations, dyspnea, pnd, orthopnea, n, v, dizziness, syncope, edema, weight gain, or early satiety.  All other systems reviewed and are otherwise negative except as noted above.  Physical Exam    VS:  BP 124/78 (BP Location: Left Arm, Patient Position: Sitting, Cuff Size: Normal)   Pulse 78   Ht 5\' 5"  (1.651 m)   Wt 159 lb 8 oz (72.3 kg)   BMI 26.54 kg/m  , BMI Body mass index is 26.54 kg/m. GEN: Well nourished, well developed, in no acute distress. HEENT: normal. Neck: Supple, no JVD, carotid bruits, or masses. Cardiac: RRR, no murmurs, rubs, or gallops. No clubbing, cyanosis, edema.  PT 2+ and equal bilaterally.  Respiratory:  Respirations regular and unlabored, clear to auscultation bilaterally. GI: Soft, nontender, nondistended, BS + x 4. MS: no deformity or atrophy. Skin: warm and dry, no rash. Neuro:  Strength and sensation are intact. Psych: Normal affect.  Accessory Clinical Findings    ECG personally reviewed by me today -regular sinus rhythm, 78 - no acute changes.  Labs from January 2020: Sodium 139, potassium 4.1, chloride 104, CO2 27.2, BUN 22, creatinine 1.1, glucose 97 Calcium 9.6  Labs from August 2018: Total cholesterol 209, triglycerides 102, HDL 56.3, LDL 132  Assessment & Plan    1.  Paroxysmal atrial fibrillation: Noted on event monitoring the setting of palpitations in February 2019.  She denies any palpitations and is sinus rhythm today.  She remains on beta-blocker and Eliquis therapy.  She recently had labs but has not had a CBC since 2018 and I will obtain this today.  She was previously receiving Eliquis through the assistance program and has reapplied but has  not received confirmation as to whether or not she will be approved.  We will provide her with samples today.  She says that if she is not reapproved for the program, she should be able to afford to remain on Eliquis.  She is clear that she prefers to avoid Coumadin.  2.  Essential hypertension: Stable on beta-blocker and ACE inhibitor therapy.  3.  PVCs/palpitations: Quiescent on  blocker.  4.  Disposition: Follow-up CBC today.  Follow-up with Dr. Merri Ray in 6 months or sooner if necessary.  We will look into where her paperwork for the eliquis assistance program is at this point.   Nicolasa Ducking, NP 12/06/2018, 9:24 AM

## 2018-12-07 LAB — CBC
Hematocrit: 40 % (ref 34.0–46.6)
Hemoglobin: 13.6 g/dL (ref 11.1–15.9)
MCH: 29.3 pg (ref 26.6–33.0)
MCHC: 34 g/dL (ref 31.5–35.7)
MCV: 86 fL (ref 79–97)
PLATELETS: 224 10*3/uL (ref 150–450)
RBC: 4.64 x10E6/uL (ref 3.77–5.28)
RDW: 12.7 % (ref 11.7–15.4)
WBC: 5.2 10*3/uL (ref 3.4–10.8)

## 2018-12-13 ENCOUNTER — Other Ambulatory Visit: Payer: Self-pay | Admitting: *Deleted

## 2018-12-13 MED ORDER — APIXABAN 5 MG PO TABS: 5.0000 mg | ORAL_TABLET | Freq: Two times a day (BID) | ORAL | 3 refills | Status: DC

## 2018-12-13 NOTE — Progress Notes (Signed)
Patient assistance application completed and faxed back on 11/04/2018. Will try to call and get update on status.

## 2018-12-16 ENCOUNTER — Telehealth: Payer: Self-pay

## 2018-12-16 NOTE — Telephone Encounter (Signed)
Application for Patient Assistance for Eliquis has been received by Brandi Scott .

## 2018-12-16 NOTE — Progress Notes (Signed)
Refaxed all paperwork to assistance program and placed in Samples closet.

## 2018-12-19 NOTE — Telephone Encounter (Signed)
Patient was told by [facility] that they did not receive house hold member - nephews information . Please call to discuss.

## 2018-12-22 ENCOUNTER — Other Ambulatory Visit: Payer: Self-pay | Admitting: Cardiovascular Disease

## 2018-12-22 MED ORDER — APIXABAN 5 MG PO TABS: 5.0000 mg | ORAL_TABLET | Freq: Two times a day (BID) | ORAL | 1 refills | Status: DC

## 2018-12-22 NOTE — Telephone Encounter (Signed)
°*  STAT* If patient is at the pharmacy, call can be transferred to refill team.   1. Which medications need to be refilled? (please list name of each medication and dose if known) Eliquis 5 MG 1 tablet 2 times daily   2. Which pharmacy/location (including street and city if local pharmacy) is medication to be sent to? [facility] on [address]   3. Do they need a 30 day or 90 day supply? 90 day   Patient will be out of medicine by tomorrow.

## 2018-12-22 NOTE — Telephone Encounter (Signed)
sCR = 1.1 on Care Everywhere 09/2018

## 2019-01-09 ENCOUNTER — Emergency Department: Payer: Medicare Other

## 2019-01-09 ENCOUNTER — Other Ambulatory Visit: Payer: Self-pay

## 2019-01-09 ENCOUNTER — Ambulatory Visit: Payer: Medicare Other | Admitting: Podiatry

## 2019-01-09 ENCOUNTER — Encounter: Payer: Self-pay | Admitting: Emergency Medicine

## 2019-01-09 ENCOUNTER — Emergency Department
Admission: EM | Admit: 2019-01-09 | Discharge: 2019-01-09 | Disposition: A | Discharge status: Home / Self Care | Payer: Medicare Other | Attending: Emergency Medicine | Admitting: Emergency Medicine

## 2019-01-09 DIAGNOSIS — Z79899 Other long term (current) drug therapy: Secondary | ICD-10-CM | POA: Diagnosis not present

## 2019-01-09 DIAGNOSIS — I10 Essential (primary) hypertension: Secondary | ICD-10-CM | POA: Insufficient documentation

## 2019-01-09 DIAGNOSIS — R42 Dizziness and giddiness: Secondary | ICD-10-CM | POA: Insufficient documentation

## 2019-01-09 DIAGNOSIS — I951 Orthostatic hypotension: Secondary | ICD-10-CM | POA: Diagnosis not present

## 2019-01-09 DIAGNOSIS — Z7901 Long term (current) use of anticoagulants: Secondary | ICD-10-CM | POA: Insufficient documentation

## 2019-01-09 DIAGNOSIS — E860 Dehydration: Secondary | ICD-10-CM | POA: Insufficient documentation

## 2019-01-09 DIAGNOSIS — R197 Diarrhea, unspecified: Secondary | ICD-10-CM | POA: Diagnosis not present

## 2019-01-09 DIAGNOSIS — R55 Syncope and collapse: Secondary | ICD-10-CM

## 2019-01-09 LAB — COMPREHENSIVE METABOLIC PANEL
ALT: 19 U/L (ref 0–44)
AST: 31 U/L (ref 15–41)
Albumin: 4.2 g/dL (ref 3.5–5.0)
Alkaline Phosphatase: 80 U/L (ref 38–126)
Anion gap: 8 (ref 5–15)
BUN: 26 mg/dL — ABNORMAL HIGH (ref 8–23)
CO2: 22 mmol/L (ref 22–32)
Calcium: 9 mg/dL (ref 8.9–10.3)
Chloride: 107 mmol/L (ref 98–111)
Creatinine, Ser: 0.9 mg/dL (ref 0.44–1.00)
GFR calc Af Amer: >60 mL/min (ref >60)
GFR calc non Af Amer: 57 mL/min — ABNORMAL LOW (ref >60)
Glucose, Bld: 126 mg/dL — ABNORMAL HIGH (ref 70–99)
Potassium: 3.8 mmol/L (ref 3.5–5.1)
Sodium: 137 mmol/L (ref 135–145)
Total Bilirubin: 1.1 mg/dL (ref 0.3–1.2)
Total Protein: 7.8 g/dL (ref 6.5–8.1)

## 2019-01-09 LAB — URINALYSIS, COMPLETE (UACMP) WITH MICROSCOPIC
Bacteria, UA: NONE SEEN
Bilirubin Urine: NEGATIVE
Glucose, UA: NEGATIVE mg/dL
Ketones, ur: NEGATIVE mg/dL
Leukocytes,Ua: NEGATIVE
Nitrite: NEGATIVE
Protein, ur: NEGATIVE mg/dL
Specific Gravity, Urine: 1.012 (ref 1.005–1.030)
Squamous Epithelial / HPF: NONE SEEN (ref 0–5)
pH: 5 (ref 5.0–8.0)

## 2019-01-09 LAB — CBC WITH DIFFERENTIAL/PLATELET
Abs Immature Granulocytes: 0.05 10*3/uL (ref 0.00–0.07)
Basophils Absolute: 0 10*3/uL (ref 0.0–0.1)
Basophils Relative: 0 %
Eosinophils Absolute: 0 10*3/uL (ref 0.0–0.5)
Eosinophils Relative: 0 %
HCT: 43.4 % (ref 36.0–46.0)
Hemoglobin: 14.5 g/dL (ref 12.0–15.0)
Immature Granulocytes: 0 %
Lymphocytes Relative: 4 %
Lymphs Abs: 0.5 10*3/uL — ABNORMAL LOW (ref 0.7–4.0)
MCH: 30.1 pg (ref 26.0–34.0)
MCHC: 33.4 g/dL (ref 30.0–36.0)
MCV: 90 fL (ref 80.0–100.0)
Monocytes Absolute: 0.6 10*3/uL (ref 0.1–1.0)
Monocytes Relative: 4 %
Neutro Abs: 12.8 10*3/uL — ABNORMAL HIGH (ref 1.7–7.7)
Neutrophils Relative %: 92 %
Platelets: 225 10*3/uL (ref 150–400)
RBC: 4.82 MIL/uL (ref 3.87–5.11)
RDW: 12.7 % (ref 11.5–15.5)
WBC: 14 10*3/uL — ABNORMAL HIGH (ref 4.0–10.5)
nRBC: 0 % (ref 0.0–0.2)

## 2019-01-09 LAB — TROPONIN I: Troponin I: <0.03 ng/mL (ref <0.03)

## 2019-01-09 MED ORDER — SODIUM CHLORIDE 0.9 % IV BOLUS
1000.0000 mL | Freq: Once | INTRAVENOUS | Status: AC
  Administered 2019-01-09: 1000 mL via INTRAVENOUS

## 2019-01-09 MED ORDER — ONDANSETRON 4 MG PO TBDP: 4.0000 mg | ORAL_TABLET | Freq: Three times a day (TID) | ORAL | 0 refills | Status: DC | PRN

## 2019-01-09 NOTE — ED Notes (Signed)
This RN to bedside, pt's BP 78/59, recheck 100/69. Pt denies feeling dizzy or weak. Pt is alert and oriented watching TV. Will consult with MD.

## 2019-01-09 NOTE — ED Notes (Signed)
Pt assisted to the bathroom at this time. Pt tolerated well. Will continue to monitor for further patient needs.

## 2019-01-09 NOTE — ED Triage Notes (Signed)
Brought over from [facility]  States she had a syncopal episode this am   States she had some abd pain with diarrhea prior to episode

## 2019-01-09 NOTE — ED Notes (Signed)
Pt given phone to call her son Gery Pray. This RN dialed phone for patient. Pt remains alert and oriented. Will continue to monitor for further patient needs.

## 2019-01-09 NOTE — ED Provider Notes (Addendum)
[facility] Emergency Department Provider Note  ____________________________________________  Time seen: Approximately 10:31 AM  I have reviewed the triage vital signs and the nursing notes.   HISTORY  Chief Complaint Near Syncope   HPI Brandi Scott is a 85 y.o. female with a history as listed below who presents for evaluation of near syncope.  Patient reports that she started having diffuse cramping abdominal pain in the middle of the night.  She had to get up several times for bowel movements.  She reports having 5-6 episodes of watery diarrhea.  After the first episode patient was standing at the sink trying to clean herself when she started feeling very dizzy.  She lowered herself to the ground and laid there for several minutes with improvement of her dizziness.  She reports that she never fully passed out.  She then had to return to the bathroom several other times for diarrhea and kept having dizzy spells and feeling like she was going to pass out.  She felt nauseous but no vomiting.  She denies melena or hematochezia.  At this time she has no abdominal pain, she had no fever but does describe chills and sweats during the episodes of diarrhea while in the bathroom.  She denies any known exposure to or travel to areas of high incidence of Covid 19.  No chest pain, shortness of breath. She has a dry cough and some sneezing which she usually gets during pollen season.  Past Medical History:  Diagnosis Date  . Chronic radicular low back pain   . Essential hypertension   . 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 History of stress test    a. 01/2012 MV: EF 74%, no ischemia.  . Hyperlipidemia   . Osteoarthritis of both hips    And knees as well as back.   [name]. PAF (paroxysmal atrial fibrillation) (HCC)    a. 10/2017 Event monitor: 46 SVTs (longest 19 beats, avg 110 bpm). Afib (<1% burden w/ avg HR 142)-->Eliquis  (CHA2DS2VASc = 4).  . Valvular heart disease    a. 2013 Echo: Moderate MR and moderate-severe TR on echocardiogram; b. Echo 2015: mild MR & TR. Nl EF, mild LVH.    Patient Active Problem List   Diagnosis Date Noted  . Paroxysmal atrial fibrillation (HCC) 12/30/2017  . Paroxysmal tachycardia (HCC) 11/19/2017  . Palpitations 11/19/2017  . Dizziness 11/19/2017  . Ankle fracture, bimalleolar, closed, left, initial encounter 09/07/2016  . Tachycardia palpitations 12/14/2014  . Frequent PVCs 12/14/2014  . Asymptomatic varicose veins of bilateral lower extremities: With mild edema 12/14/2014  . Essential hypertension 12/14/2014  . Mild mitral and tricuspid regurgitation on recent echo     Past Surgical History:  Procedure Laterality Date  . BREAST EXCISIONAL BIOPSY Right 1971, 1981   negative  . COLONOSCOPY    . Holter Monitor  May 2013   [facility]: Occasional PVCs (benign)  . NM MYOVIEW LTD  May 2013; Dec 2015   @ [facility]: a) 2013: Exercise for 2:30 min (only), hypertensive response no ischemic changes. He has 74%, normal wall motion, no evidence of ischemia or infarction.; b) Ex 3:00 min (dyspnea) 3.3 METs HR 146 (106% MPHR) - no ECG changes, E 70%, no RWMA, No ischemia or infarction  . ORIF ANKLE FRACTURE Left 09/07/2016   Procedure: OPEN REDUCTION INTERNAL FIXATION (ORIF) ANKLE FRACTURE;  Surgeon: Donato HeinzJames P Hooten, MD;  Location:  [facility] ORS;  Service: Orthopedics;  Laterality: Left;  . TONGUE BIOPSY    . TONSILLECTOMY    . TOTAL ABDOMINAL HYSTERECTOMY W/ BILATERAL SALPINGOOPHORECTOMY    . TRANSTHORACIC ECHOCARDIOGRAM  May 2013; May 2015   @ [facility] Cardiology: a) 2013 : Normal LV function, EF 65%, mild BiAE, Mild LVH, Mod MR, Mod-Severe TR; b) normal LV size, EF> 55%, mild LVH, mild MR, mild TR and    Prior to Admission medications   Medication Sig Start Date End Date Taking? Authorizing Provider  apixaban (ELIQUIS) 5 MG TABS tablet Take 1  tablet (5 mg total) by mouth 2 (two) times daily. 12/22/18   Antonieta Iba, MD  Calcium Carb-Cholecalciferol (CALCIUM 600+D) 600-800 MG-UNIT TABS Take 1 tablet by mouth daily.    [provider]  Cholecalciferol (D 1000) 1000 units capsule Take 2,000 Units by mouth daily.     [provider]  lisinopril (PRINIVIL,ZESTRIL) 20 MG tablet TAKE 1 TABLET BY MOUTH ONCE DAILY 07/19/18   [provider]  metoprolol tartrate (LOPRESSOR) 50 MG tablet Take 1 tablet (50 mg total) by mouth 2 (two) times daily. 12/14/17 12/06/18  Antonieta Iba, MD  ondansetron (ZOFRAN ODT) 4 MG disintegrating tablet Take 1 tablet (4 mg total) by mouth every 8 (eight) hours as needed. 01/09/19   Nita Sickle, MD  vitamin E 400 UNIT capsule Take 400 Units by mouth daily.    [provider]    Allergies Patient has no known allergies.  Family History  Problem Relation Age of Onset  . Alzheimer's disease Mother   . Heart failure Mother   . Hypertension Father   . Heart attack Father   . Heart disease Brother   . Stomach cancer Brother   . Heart disease Maternal Grandmother   . Breast cancer Neg Hx     Social History Social History   Tobacco Use  . Smoking status: Never Smoker  . Smokeless tobacco: Never Used  Substance Use Topics  . Alcohol use: No  . Drug use: No    Review of Systems  Constitutional: Negative for fever. + near syncope Eyes: Negative for visual changes. ENT: Negative for sore throat. Neck: No neck pain  Cardiovascular: Negative for chest pain. Respiratory: Negative for shortness of breath. + cough Gastrointestinal: Negative for abdominal pain, vomiting. +diarrhea. Genitourinary: Negative for dysuria. Musculoskeletal: Negative for back pain. Skin: Negative for rash. Neurological: Negative for headaches, weakness or numbness. Psych: No SI or HI  ____________________________________________   PHYSICAL EXAM:  VITAL SIGNS: ED Triage Vitals  [01/09/19 0842]  Enc Vitals Group     BP 131/75     Pulse Rate 97     Resp 18     Temp 98.3 F (36.8 C)     Temp Source Oral     SpO2 94 %     Weight 159 lb (72.1 kg)     Height  (1.651 m)     Head Circumference      Peak Flow      Pain Score      Pain Loc      Pain Edu?      Excl. in GC?     Constitutional: Alert and oriented. Well appearing and in no apparent distress. HEENT:      Head: Normocephalic and atraumatic.         Eyes: Conjunctivae are normal. Sclera is non-icteric.       Mouth/Throat: Mucous membranes are moist.  Neck: Supple with no signs of meningismus. Cardiovascular: Regular rate and rhythm. No murmurs, gallops, or rubs. 2+ symmetrical distal pulses are present in all extremities. No JVD. Respiratory: Normal respiratory effort. Lungs are clear to auscultation bilaterally. No wheezes, crackles, or rhonchi.  Gastrointestinal: Soft, non tender, and non distended with positive bowel sounds. No rebound or guarding. Musculoskeletal: Nontender with normal range of motion in all extremities. No edema, cyanosis, or erythema of extremities. Neurologic: Normal speech and language. Face is symmetric. Moving all extremities. No gross focal neurologic deficits are appreciated. Skin: Skin is warm, dry and intact. No rash noted. Psychiatric: Mood and affect are normal. Speech and behavior are normal.  ____________________________________________   LABS (all labs ordered are listed, but only abnormal results are displayed)  Labs Reviewed  CBC WITH DIFFERENTIAL/PLATELET - Abnormal; Notable for the following components:      Result Value   WBC 14.0 (*)    Neutro Abs 12.8 (*)    Lymphs Abs 0.5 (*)    All other components within normal limits  COMPREHENSIVE METABOLIC PANEL - Abnormal; Notable for the following components:   Glucose, Bld 126 (*)    BUN 26 (*)    GFR calc non Af Amer 57 (*)    All other components within normal limits  URINALYSIS, COMPLETE  (UACMP) WITH MICROSCOPIC - Abnormal; Notable for the following components:   Color, Urine YELLOW (*)    APPearance CLEAR (*)    Hgb urine dipstick SMALL (*)    All other components within normal limits  TROPONIN I   ____________________________________________  EKG  ED ECG REPORT I, Nita Sickle, the attending physician, personally viewed and interpreted this ECG.  Sinus tachycardia, rate of 102, normal intervals, normal axis, no ST elevations or depressions, inferior Q waves.  Unchanged from prior ____________________________________________  RADIOLOGY  I have personally reviewed the images performed during this visit and I agree with the Radiologist's read.   Interpretation by Radiologist:  Dg Chest 2 View  Result Date: 01/09/2019 CLINICAL DATA:  Syncopal episode this morning. EXAM: CHEST - 2 VIEW COMPARISON:  09/07/2016 FINDINGS: The heart size and mediastinal contours are within normal limits. Pulmonary hyperinflation again seen, consistent with COPD. Mild scarring seen in left lung base. No evidence of pulmonary infiltrate or edema. No evidence of pleural effusion. IMPRESSION: COPD and left basilar scarring.  No active lung disease. Electronically Signed   By: Myles Rosenthal M.D.   On: 01/09/2019 10:11     ____________________________________________   PROCEDURES  Procedure(s) performed: None Procedures Critical Care performed:  None ____________________________________________   INITIAL IMPRESSION / ASSESSMENT AND PLAN / ED COURSE  85 y.o. female with a history as listed below who presents for evaluation of near syncope in the setting of several episodes of watery diarrhea that started in the middle of the night.  At this time patient is well-appearing, she has no further abdominal pain and no tenderness on exam.  Orthostatic vital signs are positive consistent with some degree of dehydration in the setting of several episodes of diarrhea.  No history of C. difficile  or recent antibiotic use.  Diarrhea seems to have resolved therefore we will hold off on testing at this time.  She has no respiratory symptoms.  Will give IV fluids, check labs to rule out AKI or electrolyte abnormalities.  EKG shows no evidence of ischemia or dysrhythmia.   _________________________ 2:16 PM on 01/09/2019 -----------------------------------------  Patient with no further episodes of diarrhea in the emergency  room.  She is tolerating p.o. with no vomit.  Labs showing no evidence of AKI or anemia.  Patient does have an elevated white count most likely from a viral illness.  With no abdominal pain or tenderness I did not do a CT scan.  No further episodes of diarrhea therefore low clinical suspicion for C. difficile.  UA negative for UTI.  Patient was monitored for several hours with no evidence of dysrhythmias.  She feels markedly improved after IV fluids.  At this time she stable for discharge home.  Discussed standard return precautions with patient and recommended close follow-up with primary care doctor, increase oral hydration, bland diet.  Patient was given a prescription for Zofran as needed.   Patient was evaluated in Emergency Department today for the symptoms described in the history of present illness. Patient was evaluated in the context of the global COVID-19 pandemic, which necessitated consideration that the patient might be at risk for infection with the SARS-CoV-2 virus that causes COVID-19. Patient is well appearing with normal work of breathing, normal sats, no tachycardia or tachypnea. Institutional protocols and algorithms that pertain to the evaluation of patients at risk for COVID-19 are in a state of rapid change based on information released by regulatory bodies including the CDC and federal and state organizations. These policies and algorithms were followed during the patient's care in the ED.       As part of my medical decision making, I reviewed the  following data within the electronic MEDICAL RECORD NUMBER Nursing notes reviewed and incorporated, Labs reviewed , EKG interpreted  Old EKG reviewed, Old chart reviewed, Notes from prior ED visits and [place] Controlled Substance Database    Pertinent labs & imaging results that were available during my care of the patient were reviewed by me and considered in my medical decision making (see chart for details).    ____________________________________________   FINAL CLINICAL IMPRESSION(S) / ED DIAGNOSES  Final diagnoses:  Near syncope  Dehydration  Diarrhea of presumed infectious origin  Orthostatic hypotension      NEW MEDICATIONS STARTED DURING THIS VISIT:  ED Discharge Orders         Ordered    ondansetron (ZOFRAN ODT) 4 MG disintegrating tablet  Every 8 hours PRN     01/09/19 1415           Note:  This document was prepared using Dragon voice recognition software and may include unintentional dictation errors.    Don Perking, [place], MD 01/09/19 1417    Nita Sickle, MD 01/09/19 [phone]

## 2019-01-09 NOTE — ED Notes (Signed)
This RN to bedside, introduced self to patient apologized for delay in this RN coming to bedside. Pt states she feels a lot better after eating and "being given some fluids". Pt is alert and oriented, given 2 warm blankets. Will continue to monitor for further patient needs.

## 2019-01-09 NOTE — ED Notes (Signed)
NAD noted at time of D/C. Pt taken to lobby via wheelchair by this RN. Pt visualized in NAD. Denies comments/concerns regarding D/C instructions.

## 2019-02-06 NOTE — Telephone Encounter (Signed)
Spoke with patient and she states that she had submitted all information and she was denied again. She reports that it has been a problem and the last time she spoke with them they requested expense report from pharmacy. Advised that she needs to just bring in updated expense report from pharmacy and hopefully that should help get her the assistance for the rest of the year. Requested that she please just have them fax those expense reports to our office to my attention and I would refax all of her information. She was appreciative for the call with no further questions.

## 2019-02-09 ENCOUNTER — Telehealth: Payer: Self-pay | Admitting: Cardiovascular Disease

## 2019-02-09 NOTE — Telephone Encounter (Signed)
Patient wants to make sure [name] received medication assistance form paper work .  Please call to fu

## 2019-02-09 NOTE — Telephone Encounter (Signed)
Duplicate encounter. Please see 12/16/18 for this info

## 2019-02-09 NOTE — Telephone Encounter (Signed)
I will need to review this information once I am back in the office.

## 2019-02-17 NOTE — Telephone Encounter (Signed)
Please call regarding Eliquis paperwork.

## 2019-02-22 NOTE — Telephone Encounter (Signed)
Received medication expense reports for patient and nephew.  Called [employer] Patient Assistance program. Spoke with representative. She told me fax number is [phone]. Case # [id number] to fax to attention.  Documents faxed to number provided with case # and placed on [name] A,RN's desk.

## 2019-02-22 NOTE — Telephone Encounter (Signed)
Checked at [employer] and did not see expense report for either person.   Attempted to reach patient. Her number was busy after dialing 3 times.   S/w Christiane Ha at [facility]. He confirmed our fax number and is faxing patient's expense report to [place] shortly.  Fabio Pierce, patient's nephew, and left message with our fax number and to call his pharmacy to send his expense report to [place] as well.

## 2019-02-22 NOTE — Telephone Encounter (Signed)
I spoke with Elita Quick, RN for Dr. Mariah Milling about the patient. She said she was just waiting on the expense report from the pharmacy for the patient.  I called and spoke with the patient. She states that [facility] pharmacy was supposed to have faxed her expense report to our office on 02/08/19. She states she was told she could include an expense report for anyone else in the house as well, so a report should have come from [facility] for her nephew that lives with her, Hal Hope. This would have been faxed the same day.  I advised the patient I will forward this message our nursed in office today to see if they can locate this on [name]'s desk/ above her desk as [name] said she keeps all her patient assistance paper work there.  I advised her we will follow back up with her once we can locate this information.

## 2019-02-22 NOTE — Telephone Encounter (Signed)
Patient's nephew called back. He gave me the number to [facility] [phone]. He works in the [employer]. He provided me with his DOB 09-21-1951.  He asked for me to call [facility].  Called [facility]. Pharmacist said she would check with patient to make sure it was ok to send it to [place] and then fax it over if ok.

## 2019-02-23 NOTE — Telephone Encounter (Signed)
Received confirmation of approval for patient assistance for Eliquis.from 02/22/2019 thrugh 09/28/2019

## 2019-03-21 ENCOUNTER — Encounter: Payer: Self-pay | Admitting: *Deleted

## 2019-03-21 ENCOUNTER — Other Ambulatory Visit: Payer: Self-pay

## 2019-03-24 ENCOUNTER — Other Ambulatory Visit: Payer: Self-pay

## 2019-03-24 ENCOUNTER — Other Ambulatory Visit
Admission: RE | Admit: 2019-03-24 | Discharge: 2019-03-24 | Disposition: A | Discharge status: Home / Self Care | Payer: Medicare Other | Source: Ambulatory Visit | Attending: Ophthalmology | Admitting: Ophthalmology

## 2019-03-24 DIAGNOSIS — Z1159 Encounter for screening for other viral diseases: Secondary | ICD-10-CM | POA: Diagnosis present

## 2019-03-24 NOTE — Discharge Instructions (Signed)

## 2019-03-25 LAB — NOVEL CORONAVIRUS, NAA (HOSP ORDER, SEND-OUT TO REF LAB; TAT 18-24 HRS): SARS-CoV-2, NAA: NOT DETECTED

## 2019-03-28 ENCOUNTER — Ambulatory Visit: Payer: Medicare Other | Admitting: Anesthesiology

## 2019-03-28 ENCOUNTER — Ambulatory Visit
Admission: RE | Admit: 2019-03-28 | Discharge: 2019-03-28 | Disposition: A | Discharge status: Home / Self Care | Payer: Medicare Other | Attending: Ophthalmology | Admitting: Ophthalmology

## 2019-03-28 ENCOUNTER — Other Ambulatory Visit: Payer: Self-pay

## 2019-03-28 ENCOUNTER — Encounter
Admission: RE | Disposition: A | Discharge status: Home / Self Care | Payer: Self-pay | Source: Home / Self Care | Attending: Ophthalmology

## 2019-03-28 DIAGNOSIS — I10 Essential (primary) hypertension: Secondary | ICD-10-CM | POA: Diagnosis not present

## 2019-03-28 DIAGNOSIS — M1990 Unspecified osteoarthritis, unspecified site: Secondary | ICD-10-CM | POA: Insufficient documentation

## 2019-03-28 DIAGNOSIS — E785 Hyperlipidemia, unspecified: Secondary | ICD-10-CM | POA: Diagnosis not present

## 2019-03-28 DIAGNOSIS — H2511 Age-related nuclear cataract, right eye: Secondary | ICD-10-CM | POA: Insufficient documentation

## 2019-03-28 DIAGNOSIS — I480 Paroxysmal atrial fibrillation: Secondary | ICD-10-CM | POA: Diagnosis not present

## 2019-03-28 DIAGNOSIS — R011 Cardiac murmur, unspecified: Secondary | ICD-10-CM | POA: Insufficient documentation

## 2019-03-28 DIAGNOSIS — Z90710 Acquired absence of both cervix and uterus: Secondary | ICD-10-CM | POA: Insufficient documentation

## 2019-03-28 HISTORY — PX: CATARACT EXTRACTION W/PHACO: SHX586

## 2019-03-28 HISTORY — DX: Dizziness and giddiness: R42

## 2019-03-28 SURGERY — PHACOEMULSIFICATION, CATARACT, WITH IOL INSERTION
Anesthesia: Monitor Anesthesia Care | Site: Eye | Laterality: Right

## 2019-03-28 MED ORDER — LIDOCAINE HCL (PF) 2 % IJ SOLN
INTRAOCULAR | Status: DC | PRN
  Administered 2019-03-28: 08:00:00 1 mL

## 2019-03-28 MED ORDER — MOXIFLOXACIN HCL 0.5 % OP SOLN
OPHTHALMIC | Status: DC | PRN
  Administered 2019-03-28: 0.2 mL via OPHTHALMIC

## 2019-03-28 MED ORDER — ARMC OPHTHALMIC DILATING DROPS
1.0000 "application " | OPHTHALMIC | Status: DC | PRN
  Administered 2019-03-28 (×3): 1 via OPHTHALMIC

## 2019-03-28 MED ORDER — FENTANYL CITRATE (PF) 100 MCG/2ML IJ SOLN
INTRAMUSCULAR | Status: DC | PRN
  Administered 2019-03-28: 50 ug via INTRAVENOUS

## 2019-03-28 MED ORDER — TETRACAINE HCL 0.5 % OP SOLN
1.0000 [drp] | OPHTHALMIC | Status: DC | PRN
  Administered 2019-03-28 (×3): 1 [drp] via OPHTHALMIC

## 2019-03-28 MED ORDER — EPINEPHRINE PF 1 MG/ML IJ SOLN
INTRAOCULAR | Status: DC | PRN
  Administered 2019-03-28: 08:00:00 74 mL via OPHTHALMIC

## 2019-03-28 MED ORDER — LACTATED RINGERS IV SOLN: INTRAVENOUS | Status: DC

## 2019-03-28 MED ORDER — MIDAZOLAM HCL 2 MG/2ML IJ SOLN
INTRAMUSCULAR | Status: DC | PRN
  Administered 2019-03-28: 1 mg via INTRAVENOUS

## 2019-03-28 MED ORDER — NA CHONDROIT SULF-NA HYALURON 40-17 MG/ML IO SOLN
INTRAOCULAR | Status: DC | PRN
  Administered 2019-03-28: 1 mL via INTRAOCULAR

## 2019-03-28 MED ORDER — ONDANSETRON HCL 4 MG/2ML IJ SOLN: 4.0000 mg | Freq: Once | INTRAMUSCULAR | Status: DC | PRN

## 2019-03-28 MED ORDER — BRIMONIDINE TARTRATE-TIMOLOL 0.2-0.5 % OP SOLN
OPHTHALMIC | Status: DC | PRN
  Administered 2019-03-28: 1 [drp] via OPHTHALMIC

## 2019-03-28 SURGICAL SUPPLY — 21 items
CANNULA ANT/CHMB 27G (MISCELLANEOUS) ×1 IMPLANT
CANNULA ANT/CHMB 27GA (MISCELLANEOUS) ×3 IMPLANT
GLOVE SURG LX 8.0 MICRO (GLOVE) ×4
GLOVE SURG LX STRL 8.0 MICRO (GLOVE) ×1 IMPLANT
GLOVE SURG TRIUMPH 8.0 PF LTX (GLOVE) ×3 IMPLANT
GOWN STRL REUS W/ TWL LRG LVL3 (GOWN DISPOSABLE) ×2 IMPLANT
GOWN STRL REUS W/TWL LRG LVL3 (GOWN DISPOSABLE) ×4
LENS IOL TECNIS ITEC 23.0 (Intraocular Lens) ×2 IMPLANT
MARKER SKIN DUAL TIP RULER LAB (MISCELLANEOUS) ×3 IMPLANT
NDL FILTER BLUNT 18X1 1/2 (NEEDLE) ×1 IMPLANT
NDL RETROBULBAR .5 NSTRL (NEEDLE) ×3 IMPLANT
NEEDLE FILTER BLUNT 18X 1/2SAF (NEEDLE) ×2
NEEDLE FILTER BLUNT 18X1 1/2 (NEEDLE) ×1 IMPLANT
PACK EYE AFTER SURG (MISCELLANEOUS) ×3 IMPLANT
PACK OPTHALMIC (MISCELLANEOUS) ×3 IMPLANT
PACK PORFILIO (MISCELLANEOUS) ×3 IMPLANT
SUT ETHILON 10-0 CS-B-6CS-B-6 (SUTURE)
SUTURE EHLN 10-0 CS-B-6CS-B-6 (SUTURE) IMPLANT
SYR 3ML LL SCALE MARK (SYRINGE) ×3 IMPLANT
SYR TB 1ML LUER SLIP (SYRINGE) ×3 IMPLANT
WIPE NON LINTING 3.25X3.25 (MISCELLANEOUS) ×3 IMPLANT

## 2019-03-28 NOTE — Transfer of Care (Signed)
Immediate Anesthesia Transfer of Care Note  Patient: Brandi Scott  Procedure(s) Performed: CATARACT EXTRACTION PHACO AND INTRAOCULAR LENS PLACEMENT (IOC) RIGHT (Right Eye)  Patient Location: PACU  Anesthesia Type: MAC  Level of Consciousness: awake, alert  and patient cooperative  Airway and Oxygen Therapy: Patient Spontanous Breathing and Patient connected to supplemental oxygen  Post-op Assessment: Post-op Vital signs reviewed, Patient's Cardiovascular Status Stable, Respiratory Function Stable, Patent Airway and No signs of Nausea or vomiting  Post-op Vital Signs: Reviewed and stable  Complications: No apparent anesthesia complications

## 2019-03-28 NOTE — Anesthesia Preprocedure Evaluation (Signed)
Anesthesia Evaluation  Patient identified by MRN, date of birth, ID band Patient awake    Reviewed: Allergy & Precautions, NPO status , Patient's Chart, lab work & pertinent test results  Airway Mallampati: II  TM Distance: >3 FB     Dental   Pulmonary    breath sounds clear to auscultation       Cardiovascular hypertension, + dysrhythmias (Paroxysmal afib)  Rhythm:Regular Rate:Normal  HLD  2013 Echo: Moderate MR and moderate-severe TR  2015 Echo: mild MR & TR. Nl EF, mild LVH   Neuro/Psych    GI/Hepatic   Endo/Other    Renal/GU      Musculoskeletal  (+) Arthritis ,   Abdominal   Peds  Hematology   Anesthesia Other Findings   Reproductive/Obstetrics                             Anesthesia Physical Anesthesia Plan  ASA: II  Anesthesia Plan: MAC   Post-op Pain Management:    Induction: Intravenous  PONV Risk Score and Plan:   Airway Management Planned: Nasal Cannula  Additional Equipment:   Intra-op Plan:   Post-operative Plan:   Informed Consent: I have reviewed the patients History and Physical, chart, labs and discussed the procedure including the risks, benefits and alternatives for the proposed anesthesia with the patient or authorized representative who has indicated his/her understanding and acceptance.       Plan Discussed with: CRNA  Anesthesia Plan Comments:         Anesthesia Quick Evaluation

## 2019-03-28 NOTE — H&P (Signed)
All labs reviewed. Abnormal studies sent to patients PCP when indicated.  Previous H&P reviewed, patient examined, there are NO CHANGES.  Brandi Mccreery Porfilio6/30/20207:56 AM

## 2019-03-28 NOTE — Op Note (Signed)
PREOPERATIVE DIAGNOSIS:  Nuclear sclerotic cataract of the right eye.   POSTOPERATIVE DIAGNOSIS:  CATARACT   OPERATIVE PROCEDURE: Procedure(s): CATARACT EXTRACTION PHACO AND INTRAOCULAR LENS PLACEMENT ([place]) RIGHT   SURGEON:  Birder Robson, MD.   ANESTHESIA:  Anesthesiologist: Veda Canning, MD CRNA: Cameron Ali, CRNA  1.      Managed anesthesia care. 2.      0.43ml of Shugarcaine was instilled in the eye following the paracentesis.   COMPLICATIONS:  None.   TECHNIQUE:   Stop and chop   DESCRIPTION OF PROCEDURE:  The patient was examined and consented in the preoperative holding area where the aforementioned topical anesthesia was applied to the right eye and then brought back to the Operating Room where the right eye was prepped and draped in the usual sterile ophthalmic fashion and a lid speculum was placed. A paracentesis was created with the side port blade and the anterior chamber was filled with viscoelastic. A near clear corneal incision was performed with the steel keratome. A continuous curvilinear capsulorrhexis was performed with a cystotome followed by the capsulorrhexis forceps. Hydrodissection and hydrodelineation were carried out with BSS on a blunt cannula. The lens was removed in a stop and chop  technique and the remaining cortical material was removed with the irrigation-aspiration handpiece. The capsular bag was inflated with viscoelastic and the Technis [id number]  lens was placed in the capsular bag without complication. The remaining viscoelastic was removed from the eye with the irrigation-aspiration handpiece. The wounds were hydrated. The anterior chamber was flushed with BSS and the eye was inflated to physiologic pressure. 0.63ml of Vigamox was placed in the anterior chamber. The wounds were found to be water tight. The eye was dressed with Combigan. The patient was given protective glasses to wear throughout the day and a shield with which to sleep tonight. The patient  was also given drops with which to begin a drop regimen today and will follow-up with me in one day. Implant Name Type Inv. Item Serial No. Manufacturer Lot No. LRB No. Used Action  LENS IOL DIOP 23.0 - [id number] Intraocular Lens LENS IOL DIOP 23.0 [id number]  Right 1 Implanted   Procedure(s): CATARACT EXTRACTION PHACO AND INTRAOCULAR LENS PLACEMENT (IOC) RIGHT (Right)  Electronically signed: Birder Robson 03/28/2019 8:24 AM \

## 2019-03-28 NOTE — Anesthesia Postprocedure Evaluation (Signed)
Anesthesia Post Note  Patient: Brandi Scott  Procedure(s) Performed: CATARACT EXTRACTION PHACO AND INTRAOCULAR LENS PLACEMENT (IOC) RIGHT (Right Eye)  Patient location during evaluation: PACU Anesthesia Type: MAC Level of consciousness: awake and alert Pain management: pain level controlled Vital Signs Assessment: post-procedure vital signs reviewed and stable Respiratory status: spontaneous breathing, nonlabored ventilation, respiratory function stable and patient connected to nasal cannula oxygen Cardiovascular status: stable and blood pressure returned to baseline Postop Assessment: no apparent nausea or vomiting Anesthetic complications: no    Veda Canning

## 2019-03-28 NOTE — Anesthesia Procedure Notes (Signed)
Procedure Name: MAC Date/Time: 03/28/2019 8:05 AM Performed by: Cameron Ali, CRNA Pre-anesthesia Checklist: Patient identified, Emergency Drugs available, Suction available, Timeout performed and Patient being monitored Patient Re-evaluated:Patient Re-evaluated prior to induction Oxygen Delivery Method: Nasal cannula Placement Confirmation: positive ETCO2

## 2019-08-21 ENCOUNTER — Telehealth: Payer: Self-pay | Admitting: Cardiovascular Disease

## 2019-08-21 NOTE — Telephone Encounter (Signed)
Patient dropped of Eliquis assistance forms with proof of income insurance and pharmacy expenses report   Placed in cma box.

## 2019-12-06 ENCOUNTER — Ambulatory Visit (INDEPENDENT_AMBULATORY_CARE_PROVIDER_SITE_OTHER): Payer: Medicare Other | Admitting: Family

## 2019-12-06 ENCOUNTER — Other Ambulatory Visit: Payer: Self-pay

## 2019-12-06 ENCOUNTER — Encounter: Payer: Self-pay | Admitting: Family

## 2019-12-06 VITALS — BP 130/84 | HR 83 | Ht 65.0 in | Wt 157.8 lb

## 2019-12-06 DIAGNOSIS — I10 Essential (primary) hypertension: Secondary | ICD-10-CM

## 2019-12-06 DIAGNOSIS — I493 Ventricular premature depolarization: Secondary | ICD-10-CM

## 2019-12-06 DIAGNOSIS — I480 Paroxysmal atrial fibrillation: Secondary | ICD-10-CM

## 2019-12-06 MED ORDER — METOPROLOL TARTRATE 50 MG PO TABS: 50.0000 mg | ORAL_TABLET | Freq: Two times a day (BID) | ORAL | 3 refills | Status: DC

## 2019-12-06 MED ORDER — LISINOPRIL 20 MG PO TABS: 20.0000 mg | ORAL_TABLET | Freq: Every day | ORAL | 3 refills | Status: DC

## 2019-12-06 MED ORDER — APIXABAN 5 MG PO TABS: 5.0000 mg | ORAL_TABLET | Freq: Two times a day (BID) | ORAL | 3 refills | Status: DC

## 2019-12-06 NOTE — Progress Notes (Signed)
Office Visit    Patient Name: Brandi Scott Date of Encounter: 12/06/2019  Primary Care Provider:  [name], MD Primary Cardiologist:  [name], MD Electrophysiologist:  None   Chief Complaint    Brandi Scott is a 86 y.o. female with a hx of paroxysmal atrial fibrillation, palpitation/PVC, HTN, HLD, valvular heart disease presents today for follow-up of PAF  Past Medical History    Past Medical History:  Diagnosis Date  . Chronic radicular low back pain   . Essential hypertension   . 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 History of stress test    a. 01/2012 MV: EF 74%, no ischemia.  . Hyperlipidemia   . Osteoarthritis of both hips    And knees as well as back.   [name] PAF (paroxysmal atrial fibrillation) (HCC)    a. 10/2017 Event monitor: 46 SVTs (longest 19 beats, avg 110 bpm). Afib (<1% burden w/ avg HR 142)-->Eliquis (CHA2DS2VASc = 4).  . Valvular heart disease    a. 2013 Echo: Moderate MR and moderate-severe TR on echocardiogram; b. Echo 2015: mild MR & TR. Nl EF, mild LVH.  [name] Vertigo    none in several years   Past Surgical History:  Procedure Laterality Date  . BREAST EXCISIONAL BIOPSY Right 1971, 1981   negative  . CATARACT EXTRACTION W/PHACO Right 03/28/2019   Procedure: CATARACT EXTRACTION PHACO AND INTRAOCULAR LENS PLACEMENT (IOC) RIGHT;  Surgeon: Galen Manila, MD;  Location: [facility];  Service: Ophthalmology;  Laterality: Right;  . COLONOSCOPY    . Holter Monitor  May 2013   @ [facility] Cardiology: Occasional PVCs (benign)  . NM MYOVIEW LTD  May 2013; Dec 2015   @ [facility] Cardiology: a) 2013: Exercise for 2:30 min (only), hypertensive response no ischemic changes. He has 74%, normal wall motion, no evidence of ischemia or infarction.; b) Ex 3:00 min (dyspnea) 3.3 METs HR 146 (106% MPHR) - no ECG changes, E 70%, no RWMA, No ischemia or infarction  . ORIF ANKLE FRACTURE Left  09/07/2016   Procedure: OPEN REDUCTION INTERNAL FIXATION (ORIF) ANKLE FRACTURE;  Surgeon: Donato Heinz, MD;  Location: [facility] ORS;  Service: Orthopedics;  Laterality: Left;  . TONGUE BIOPSY    . TONSILLECTOMY    . TOTAL ABDOMINAL HYSTERECTOMY W/ BILATERAL SALPINGOOPHORECTOMY    . TRANSTHORACIC ECHOCARDIOGRAM  May 2013; May 2015   @ [facility] Cardiology: a) 2013 : Normal LV function, EF 65%, mild BiAE, Mild LVH, Mod MR, Mod-Severe TR; b) normal LV size, EF> 55%, mild LVH, mild MR, mild TR and    Allergies  No Known Allergies  History of Present Illness    Brandi Scott is a 86 y.o. female with a hx of paroxysmal atrial fibrillation, palpitation/PVC, HTN, HLD, valvular heart disease last seen 12/06/18 by Ward Givens, NP.   She underwent previous stress testing in 2013 was nonischemic.  Last echo June 2015 with mild MR, TR, PR and trivial AI.  February 2019 due to palpitations underwent event monitoring with 46 brief episodes of SVT and PAF less than 1% burden.  Placed on Eliquis therapy and maintained on beta-blocker.  Labs 07/05/19 via Care Everywhere:   Hb 13.6, K 4.0, creatinine 1.0, GFR 52, AST 20, ALT 13  Present today with her nephew who she lives with. Stays busy around the house with housework and cooking.   Reports no chest pain, pressure, tightness. No shortness  of breath nor dyspnea on exertion. She reports no palpitations.   Reports compliance with her anticoagulation. Denies bleeding complications. She inquired about her patient assistance paperwork. It was denied as her insurance covers Eliquis. We discussed that she may need to meet an out of pocket cost prior to better coverage.   EKGs/Labs/Other Studies Reviewed:   The following studies were reviewed today:  EKG:  EKG is ordered today.  The ekg ordered today demonstrates SR 83 bpm with sinus arrthymia - no acute ST/T wave changes.  Recent Labs: 01/09/2019: ALT 19; BUN 26; Creatinine, Ser 0.90; Hemoglobin 14.5;  Platelets 225; Potassium 3.8; Sodium 137  Recent Lipid Panel No results found for: CHOL, TRIG, HDL, CHOLHDL, VLDL, LDLCALC, LDLDIRECT  Home Medications   Current Meds  Medication Sig  . apixaban (ELIQUIS) 5 MG TABS tablet Take 1 tablet (5 mg total) by mouth 2 (two) times daily.  . Calcium Carb-Cholecalciferol (CALCIUM 600+D) 600-800 MG-UNIT TABS Take 1 tablet by mouth daily.  . Cholecalciferol (D 1000) 1000 units capsule Take 2,000 Units by mouth daily.   [name] lisinopril (PRINIVIL,ZESTRIL) 20 MG tablet TAKE 1 TABLET BY MOUTH ONCE DAILY  . metoprolol tartrate (LOPRESSOR) 50 MG tablet Take 1 tablet (50 mg total) by mouth 2 (two) times daily.  . ondansetron (ZOFRAN ODT) 4 MG disintegrating tablet Take 1 tablet (4 mg total) by mouth every 8 (eight) hours as needed.  . Probiotic Product (PROBIOTIC DAILY PO) Take by mouth daily.  . vitamin E 400 UNIT capsule Take 400 Units by mouth daily.     Review of Systems   Review of Systems  Constitution: Negative for chills, fever and malaise/fatigue.  Cardiovascular: Negative for chest pain, dyspnea on exertion, irregular heartbeat, leg swelling, near-syncope, orthopnea, palpitations and syncope.  Respiratory: Negative for cough, shortness of breath and wheezing.   Gastrointestinal: Negative for melena, nausea and vomiting.  Genitourinary: Negative for hematuria.  Neurological: Negative for dizziness, light-headedness and weakness.   All other systems reviewed and are otherwise negative except as noted above.  Physical Exam    VS:  There were no vitals taken for this visit. , BMI There is no height or weight on file to calculate BMI. GEN: Well nourished, well developed, in no acute distress. HEENT: normal. Neck: Supple, no JVD, carotid bruits, or masses. Cardiac: RRR, no murmurs, rubs, or gallops. No clubbing, cyanosis, edema.  Radials/DP/PT 2+ and equal bilaterally.  Respiratory:  Respirations regular and unlabored, clear to auscultation  bilaterally. GI: Soft, nontender, nondistended, BS + x 4. MS: No deformity or atrophy. Skin: Warm and dry, no rash. Neuro:  Strength and sensation are intact. Psych: Normal affect.  Assessment & Plan    1. PAF - No evidence of recurrence. CHADS2VASc of at least 80 (age, gender, HTN). Continue Metoprolol and Eliquis. Provided 1 month sample of Eliquis today. She does not qualify for patient assistance as her insurance covers Eliquis. She was sent a refill today and will discuss cost with pharmacy. Discussed that she may have to meet an out of pocket minimum prior to insurance providing greater coverage.   2. HTN - BP well controlled. Continue Lisinopril 20mg  daily and Metoprolol 50mg  BID.  3. PVC/palpitation - Denies palpitations today. EKG today with no acute findings. Continue Metoprolol 50mg  BID.  Disposition: Follow up in 6 month(s) with Dr. Rockey Situ or APP.   Loel Dubonnet, NP 12/06/2019, 1:50 PM

## 2019-12-06 NOTE — Patient Instructions (Signed)
Medication Instructions:  - Your physician recommends that you continue on your current medications as directed. Please refer to the Current Medication list given to you today.  - We have refilled your metoprolol/ lisinopril/ & eliquis  Samples Given: Eliquis 5 mg Lot: [id number] Exp: 1/23 # 4 boxes given   *If you need a refill on your cardiac medications before your next appointment, please call your pharmacy*   Lab Work: - none ordered  If you have labs (blood work) drawn today and your tests are completely normal, you will receive your results only by: [name] MyChart Message (if you have MyChart) OR . A paper copy in the mail If you have any lab test that is abnormal or we need to change your treatment, we will call you to review the results.   Testing/Procedures: - none ordered   Follow-Up: At [facility], you and your health needs are our priority.  As part of our continuing mission to provide you with exceptional heart care, we have created designated Provider Care Teams.  These Care Teams include your primary Cardiologist (physician) and Advanced Practice Providers (APPs -  Physician Assistants and Nurse Practitioners) who all work together to provide you with the care you need, when you need it.  We recommend signing up for the patient portal called "MyChart".  Sign up information is provided on this After Visit Summary.  MyChart is used to connect with patients for Virtual Visits (Telemedicine).  Patients are able to view lab/test results, encounter notes, upcoming appointments, etc.  Non-urgent messages can be sent to your provider as well.   To learn more about what you can do with MyChart, go to [email or web address].    Your next appointment:   6 month(s)  The format for your next appointment:   In Person  Provider:    You may see [name], MD or one of the following Advanced Practice Providers on your designated Care Team:    Nicolasa Ducking,  NP  Eula Listen, PA-C  Marisue Ivan, PA-C    Other Instructions n/a

## 2019-12-20 ENCOUNTER — Telehealth: Payer: Self-pay | Admitting: Cardiovascular Disease

## 2019-12-20 NOTE — Telephone Encounter (Signed)
Spoke with patient to explain how this can't be sent until she provides [place] with the expense report from her pharmacy showing they have spent more than 3% of her income. I think that would be roughly $459 and told her that I would check with her pharmacy and her nephew to determine if that has been met and would be in touch with any further instructions regarding her application. She verbalized understanding with no further questions at this time.

## 2019-12-20 NOTE — Telephone Encounter (Signed)
Patient calling to clarify  States that Brandi Scott says they never received Eliquis patient assistance forms and patient hasnever received medication Please call to discuss

## 2019-12-21 NOTE — Telephone Encounter (Signed)
Spoke with patient and advised that I would not be able to call pharmacies for out of pocket expenses before the end of today with my apologies. Advised that once I return in the office next week I will follow up on this and give her a call back. She verbalized understanding with no further questions at this time.

## 2019-12-26 NOTE — Telephone Encounter (Signed)
Spoke with pharmacy tech to see patients out of pocket expenses so far this year. She states that patient is only getting 1 script for $4 and the other is $2. She reports Eliquis is $142.00 for one month supply after running her insurance and considering she is on no additional medications she will most likely not qualify for assistance. Will call patient to update her on this information.

## 2019-12-26 NOTE — Telephone Encounter (Signed)
Spoke with patient and reviewed that unfortunately she is not paying enough out of pocket to meet her 3% requirement and most likely would not meet it for the year. Reviewed that her Eliquis for one month supply was $142.00 after insurance. Inquired if she wants a different medication such as warfarin and she was very adamant that she wanted to continue her current dose of Eliquis. She was very appreciative for the time I took to assist with paperwork and checking with pharmacies. She had no further questions at this time.

## 2020-06-08 NOTE — Progress Notes (Signed)
Cardiology Office Note  Date:  06/10/2020   ID:  BRYLEI Scott, DOB 03/08/1929, MRN [id number]  PCP:  Brandi Mina, MD   Chief Complaint  Patient presents with  . Other    6 month follow up. Patient was eating Saturday night - heart started racing - not lasting very long. meds reviewed verbally with patient.     HPI:  Brandi Scott is a 86 y.o. female with a PMH mild, asymptomatic valvular disease with mild mitral insufficiency  Holter monitor identified PVCs occasionally in bigeminy.   echocardiogram and Myoview back in 2013   echocardiogram in May of 2015 and Myoview in December 2015  improved overall valve function, and still stable nonischemic Exercise Myoview  decreased exercise capacity.  She presents today with her son Rare episodes of tachycardia Episode this past weekend, stressful situation had family over that would not go home, felt tachycardic, symptoms resolved after she laid down Did not have to take extra metoprolol Compliant with her metoprolol tartrate 50 twice daily  On past clinic visit reported having rare episodes of atrial tachycardia lasting less than 30 minutes  Tolerating Eliquis Mild ankle swelling, sitting for long periods of time No regular exercise  EKG personally reviewed by myself on todays visit Shows sinus tachycardia rate 107 bpm no significant ST-T wave changes  Other past medical history reviewed Event monitor ordered 46 Supraventricular Tachycardia runs occurred,  the longest lasting 19 beats with an avg rate of 110 bpm.  Atrial Fibrillation occurred (<1% burden), ranging from 98-172 bpm (avg of 142 bpm), the longest lasting 15 mins 14 secs with an avg rate of 144 bpm. Atrial Fibrillation was detected within +/- 45 seconds of symptomatic patient event(s).   September  2016.  one spell of rapid heart rates that lasted about 15 minutes triggered by a very frightful situation, having seen a snake in her house.   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  PSH:    Past Surgical History:  Procedure Laterality Date  . BREAST EXCISIONAL BIOPSY Right 1971, 1981   negative  . CATARACT EXTRACTION W/PHACO Right 03/28/2019   Procedure: CATARACT EXTRACTION PHACO AND INTRAOCULAR LENS PLACEMENT (IOC) RIGHT;  Surgeon: Galen Manila, MD;  Location: [facility];  Service: Ophthalmology;  Laterality: Right;  . COLONOSCOPY    . Holter Monitor  May 2013   @ [facility] Cardiology: Occasional PVCs (benign)  . NM MYOVIEW LTD  May 2013; Dec 2015   @ [facility] Cardiology: a) 2013: Exercise for 2:30 min (only), hypertensive response no ischemic changes. He has 74%, normal wall motion, no evidence of ischemia or infarction.; b) Ex 3:00 min (dyspnea) 3.3 METs HR 146 (106% MPHR) - no ECG changes, E 70%, no RWMA, No ischemia or infarction  . ORIF ANKLE FRACTURE Left 09/07/2016   Procedure: OPEN REDUCTION INTERNAL FIXATION (ORIF) ANKLE FRACTURE;  Surgeon: Donato Heinz, MD;  Location: [facility] OR[facility];  Service: Orthopedics;  Laterality: Left;  . TONGUE BIOPSY    . TONSILLECTOMY    . TOTAL ABDOMINAL HYSTERECTOMY W/ BILATERAL SALPINGOOPHORECTOMY    . TRANSTHORACIC ECHOCARDIOGRAM  May 2013; May 2015   @ [facility] Cardiology: a) 2013 : Normal LV function, EF 65%, mild BiAE, Mild LVH, Mod MR, Mod-Severe TR; b) normal LV size, EF>  55%, mild LVH, mild MR, mild TR and    Current Outpatient Medications  Medication Sig Dispense Refill  . apixaban (ELIQUIS) 5 MG TABS tablet Take 1 tablet (5 mg total) by mouth 2 (two) times daily. 180 tablet 3  . Calcium Carb-Cholecalciferol (CALCIUM 600+D) 600-800 MG-UNIT TABS Take 1 tablet by mouth daily.    . Cholecalciferol (D 1000) 1000 units capsule Take 2,000 Units by  mouth daily.     [name] lisinopril (ZESTRIL) 20 MG tablet Take 1 tablet (20 mg total) by mouth daily. 90 tablet 3  . metoprolol tartrate (LOPRESSOR) 50 MG tablet Take 1 tablet (50 mg total) by mouth 2 (two) times daily. 180 tablet 3  . Probiotic Product (PROBIOTIC DAILY PO) Take by mouth daily.    . vitamin E 400 UNIT capsule Take 400 Units by mouth daily.     No current facility-administered medications for this visit.     Allergies:   Patient has no known allergies.   Social History:  The patient  reports that she has never smoked. She has never used smokeless tobacco. She reports that she does not drink alcohol and does not use drugs.   Family History:   family history includes Alzheimer's disease in her mother; Heart attack in her father; Heart disease in her brother and maternal grandmother; Heart failure in her mother; Hypertension in her father; Stomach cancer in her brother.    Review of Systems: Review of Systems  Constitutional: Negative.   Respiratory: Negative.   Cardiovascular: Positive for palpitations.  Gastrointestinal: Negative.   Musculoskeletal: Negative.   Neurological: Negative.   Psychiatric/Behavioral: Negative.   All other systems reviewed and are negative.   PHYSICAL EXAM: VS:  BP 130/76 (BP Location: Right Arm, Patient Position: Sitting, Cuff Size: Normal)   Pulse (!) 107   Ht 5\' 5"  (1.651 m)   Wt 156 lb (70.8 kg)   SpO2 96%   BMI 25.96 kg/m  , BMI Body mass index is 25.96 kg/m. Constitutional:  oriented to person, place, and time. No distress.  Presenting in a wheelchair HENT:  Head: Grossly normal Eyes:  no discharge. No scleral icterus.  Neck: No JVD, no carotid bruits  Cardiovascular: Regular rate and rhythm, no murmurs appreciated Pulmonary/Chest: Clear to auscultation bilaterally, no wheezes or rails Abdominal: Soft.  no distension.  no tenderness.  Musculoskeletal: Normal range of motion Neurological:  normal muscle tone. Coordination normal.  No atrophy Skin: Skin warm and dry Psychiatric: normal affect, pleasant    Recent Labs: No results found for requested labs within last 8760 hours.    Lipid Panel No results found for: CHOL, HDL, LDLCALC, TRIG    Wt Readings from Last 3 Encounters:  06/10/20 156 lb (70.8 kg)  12/06/19 157 lb 12 oz (71.6 kg)  03/28/19 157 lb (71.2 kg)      ASSESSMENT AND PLAN:  Paroxysmal atrial fibrillation Tolerating Eliquis 5mg   twice daily Metoprolol tartrate 50 twice daily Recommended for any breakthrough tachycardia she take extra half dose metoprolol  Essential hypertension - Blood pressure is well controlled on today's visit. No changes made to the medications.  Frequent PVCs -  Asymptomatic on metoprolol  Mild mitral and tricuspid regurgitation No further work-up at this time Appears euvolemic  Asymptomatic varicose veins of bilateral lower extremities: Recommend compression hose, leg elevation,  walking program but she is limited by right hip pain   Total encounter time more than 25 minutes  Greater than 50% was spent in counseling  and coordination of care with the patient    No orders of the defined types were placed in this encounter.    Signed, Dossie Arbour, M.D., Ph.D. 06/10/2020  [facility] [place], [place] [phone]

## 2020-06-10 ENCOUNTER — Ambulatory Visit: Payer: Medicare Other | Admitting: Cardiovascular Disease

## 2020-06-10 ENCOUNTER — Encounter: Payer: Self-pay | Admitting: Cardiovascular Disease

## 2020-06-10 ENCOUNTER — Other Ambulatory Visit: Payer: Self-pay

## 2020-06-10 VITALS — BP 130/76 | HR 107 | Ht 65.0 in | Wt 156.0 lb

## 2020-06-10 DIAGNOSIS — I10 Essential (primary) hypertension: Secondary | ICD-10-CM | POA: Diagnosis not present

## 2020-06-10 DIAGNOSIS — I480 Paroxysmal atrial fibrillation: Secondary | ICD-10-CM

## 2020-06-10 DIAGNOSIS — R002 Palpitations: Secondary | ICD-10-CM | POA: Diagnosis not present

## 2020-06-10 DIAGNOSIS — I493 Ventricular premature depolarization: Secondary | ICD-10-CM | POA: Diagnosis not present

## 2020-06-10 NOTE — Patient Instructions (Signed)
Medication Instructions:  No changes  If you need a refill on your cardiac medications before your next appointment, please call your pharmacy.    Lab work: No new labs needed   If you have labs (blood work) drawn today and your tests are completely normal, you will receive your results only by: . MyChart Message (if you have MyChart) OR . A paper copy in the mail If you have any lab test that is abnormal or we need to change your treatment, we will call you to review the results.   Testing/Procedures: No new testing needed   Follow-Up: At [facility], you and your health needs are our priority.  As part of our continuing mission to provide you with exceptional heart care, we have created designated Provider Care Teams.  These Care Teams include your primary Cardiologist (physician) and Advanced Practice Providers (APPs -  Physician Assistants and Nurse Practitioners) who all work together to provide you with the care you need, when you need it.  . You will need a follow up appointment in 6 months  . Providers on your designated Care Team:   . Christopher Berge, NP . Ryan Dunn, PA-C . Jacquelyn Visser, PA-C  Any Other Special Instructions Will Be Listed Below (If Applicable).  COVID-19 Vaccine Information can be found at: [email or web address] For questions related to vaccine distribution or appointments, please email [email or web address] or call [phone].     

## 2020-12-02 ENCOUNTER — Other Ambulatory Visit: Payer: Self-pay | Admitting: Family

## 2020-12-08 NOTE — Progress Notes (Unsigned)
Cardiology Office Note  Date:  12/09/2020   ID:  Brandi Scott, DOB Mar 18, 1929, MRN [id number]  PCP:  [name], MD   Chief Complaint  Patient presents with  . Follow-up    6 month F/U    HPI:  Brandi Scott is a 87 y.o. female with a PMH mild, asymptomatic valvular disease with mild mitral insufficiency  Holter monitor identified PVCs occasionally in bigeminy.   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, some anxiety issues  Son confirms this  Anxious to get into the office today BP elevated today, Otherwise denies any new symptoms  Rare tachycardia, couple episodes over the past several months Typically they last around 30 min lays down, and they go away without intervention Feels they are episodes of atrial fibrillation Has not been taking extra metoprolol  Typically takes metoprolol tartrate 50 twice daily Tolerating Eliquis, no recent falls, no bleeding  EKG personally reviewed by myself on todays visit Shows normal sinus rhythm rate 65 bpm  Other past medical history reviewed Event monitor ordered 46 Supraventricular Tachycardia runs occurred,  the longest lasting 19 beats with an avg rate of 110 bpm.  Atrial Fibrillation occurred (<1% burden), ranging from 98-172 bpm (avg of 142 bpm), the longest lasting 15 mins 14 secs with an avg rate of 144 bpm. Atrial Fibrillation was detected within +/- 45 seconds of symptomatic patient event(s).   September  2016.  one spell of rapid heart rates that lasted about 15 minutes triggered by a very frightful situation, having seen a snake in her house.   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  PSH:    Past Surgical History:  Procedure Laterality Date  . BREAST EXCISIONAL BIOPSY Right 1971, 1981   negative  . CATARACT EXTRACTION W/PHACO Right 03/28/2019   Procedure: CATARACT EXTRACTION PHACO AND INTRAOCULAR LENS PLACEMENT (IOC) RIGHT;  Surgeon: Galen Manila, MD;  Location: [facility];  Service: Ophthalmology;  Laterality: Right;  . COLONOSCOPY    . Holter Monitor  May 2013   @ [facility] Cardiology: Occasional PVCs (benign)  . NM MYOVIEW LTD  May 2013; Dec 2015   @ [facility] Cardiology: a) 2013: Exercise for 2:30 min (only), hypertensive response no ischemic changes. He has 74%, normal wall motion, no evidence of ischemia or infarction.; b) Ex 3:00 min (dyspnea) 3.3 METs HR 146 (106% MPHR) - no ECG changes, E 70%, no RWMA, No ischemia or infarction  . ORIF ANKLE FRACTURE Left 09/07/2016   Procedure: OPEN REDUCTION INTERNAL FIXATION (ORIF) ANKLE FRACTURE;  Surgeon: Donato Heinz, MD;  Location: [facility] ORS;  Service: Orthopedics;  Laterality: Left;  . TONGUE BIOPSY    . TONSILLECTOMY    . TOTAL ABDOMINAL HYSTERECTOMY W/ BILATERAL SALPINGOOPHORECTOMY    . TRANSTHORACIC ECHOCARDIOGRAM  May 2013; May 2015   @ [facility] Cardiology: a) 2013 : Normal LV function, EF 65%, mild BiAE, Mild LVH, Mod MR, Mod-Severe TR; b) normal LV size, EF> 55%, mild LVH, mild MR, mild TR and    Current  Outpatient Medications  Medication Sig Dispense Refill  . apixaban (ELIQUIS) 5 MG TABS tablet Take 1 tablet (5 mg total) by mouth 2 (two) times daily. 180 tablet 3  . Calcium Carb-Cholecalciferol 600-800 MG-UNIT TABS Take 1 tablet by mouth daily.    . Cholecalciferol 25 MCG (1000 UT) capsule Take 2,000 Units by mouth daily.     [name] lisinopril (ZESTRIL) 20 MG tablet Take 1 tablet (20 mg total) by mouth daily.  90 tablet 3  . metoprolol tartrate (LOPRESSOR) 50 MG tablet Take 1 tablet by mouth twice daily 180 tablet 0  . Probiotic Product (PROBIOTIC DAILY PO) Take by mouth daily.    . vitamin E 400 UNIT capsule Take 400 Units by mouth daily.     No current facility-administered medications for this visit.    Allergies:   Patient has no known allergies.   Social History:  The patient  reports that she has never smoked. She has never used smokeless tobacco. She reports that she does not drink alcohol and does not use drugs.   Family History:   family history includes Alzheimer's disease in her mother; Heart attack in her father; Heart disease in her brother and maternal grandmother; Heart failure in her mother; Hypertension in her father; Stomach cancer in her brother.    Review of Systems: Review of Systems  Constitutional: Negative.   HENT: Negative.   Respiratory: Negative.   Cardiovascular: Positive for palpitations.  Gastrointestinal: Negative.   Musculoskeletal: Negative.   Neurological: Negative.   Psychiatric/Behavioral: Negative.   All other systems reviewed and are negative.   PHYSICAL EXAM: VS:  BP (!) 180/98 (BP Location: Left Arm, Patient Position: Sitting, Cuff Size: Normal)   Pulse 65   Ht 5\' 5"  (1.651 m)   Wt 151 lb (68.5 kg)   SpO2 96%   BMI 25.13 kg/m  , BMI Body mass index is 25.13 kg/m. Constitutional:  oriented to person, place, and time. No distress.  Presenting in a wheelchair HENT:  Head: Grossly normal Eyes:  no discharge. No scleral icterus.  Neck: No JVD, no carotid bruits  Cardiovascular: Regular rate and rhythm, no murmurs appreciated Pulmonary/Chest: Clear to auscultation bilaterally, no wheezes or rails Abdominal: Soft.  no distension.  no tenderness.  Musculoskeletal: Normal range of motion Neurological:  normal muscle tone. Coordination normal. No atrophy Skin: Skin warm and dry Psychiatric: normal affect, pleasant   Recent Labs: No results  found for requested labs within last 8760 hours.    Lipid Panel No results found for: CHOL, HDL, LDLCALC, TRIG    Wt Readings from Last 3 Encounters:  12/09/20 151 lb (68.5 kg)  06/10/20 156 lb (70.8 kg)  12/06/19 157 lb 12 oz (71.6 kg)     ASSESSMENT AND PLAN:  Paroxysmal atrial fibrillation Tolerating Eliquis 5mg   twice daily Metoprolol tartrate 50 twice daily Recommended for any breakthrough tachycardia she take extra half dose metoprolol  Essential hypertension - Blood pressure is well controlled on today's visit. No changes made to the medications.  Frequent PVCs -  Asymptomatic on metoprolol  Mild mitral and tricuspid regurgitation No further work-up at this time Appears euvolemic  Asymptomatic varicose veins of bilateral lower extremities: Recommend compression hose, leg elevation,  Limited by right hip pain Uses a cane   Total encounter time more than 25 minutes  Greater than 50% was spent in counseling and coordination of care with the patient    Orders Placed This Encounter  Procedures  . EKG 12-Lead  Signed, Dossie Arbour, M.D., Ph.D. 12/09/2020  [facility] [place], [place] [phone]

## 2020-12-09 ENCOUNTER — Encounter: Payer: Self-pay | Admitting: Cardiovascular Disease

## 2020-12-09 ENCOUNTER — Other Ambulatory Visit: Payer: Self-pay

## 2020-12-09 ENCOUNTER — Ambulatory Visit: Payer: Medicare Other | Admitting: Cardiovascular Disease

## 2020-12-09 VITALS — BP 180/98 | HR 65 | Ht 65.0 in | Wt 151.0 lb

## 2020-12-09 DIAGNOSIS — R002 Palpitations: Secondary | ICD-10-CM

## 2020-12-09 DIAGNOSIS — I10 Essential (primary) hypertension: Secondary | ICD-10-CM | POA: Diagnosis not present

## 2020-12-09 DIAGNOSIS — I493 Ventricular premature depolarization: Secondary | ICD-10-CM | POA: Diagnosis not present

## 2020-12-09 DIAGNOSIS — I480 Paroxysmal atrial fibrillation: Secondary | ICD-10-CM | POA: Diagnosis not present

## 2020-12-09 NOTE — Patient Instructions (Signed)
Medication Instructions:  No changes  If you need a refill on your cardiac medications before your next appointment, please call your pharmacy.    Lab work: No new labs needed   If you have labs (blood work) drawn today and your tests are completely normal, you will receive your results only by: . MyChart Message (if you have MyChart) OR . A paper copy in the mail If you have any lab test that is abnormal or we need to change your treatment, we will call you to review the results.   Testing/Procedures: No new testing needed   Follow-Up: At [facility], you and your health needs are our priority.  As part of our continuing mission to provide you with exceptional heart care, we have created designated Provider Care Teams.  These Care Teams include your primary Cardiologist (physician) and Advanced Practice Providers (APPs -  Physician Assistants and Nurse Practitioners) who all work together to provide you with the care you need, when you need it.  . You will need a follow up appointment in 6 months  . Providers on your designated Care Team:   . Christopher Berge, NP . Ryan Dunn, PA-C . Jacquelyn Visser, PA-C  Any Other Special Instructions Will Be Listed Below (If Applicable).  COVID-19 Vaccine Information can be found at: [email or web address] For questions related to vaccine distribution or appointments, please email [email or web address] or call [phone].     

## 2020-12-18 ENCOUNTER — Other Ambulatory Visit: Payer: Self-pay | Admitting: Family

## 2020-12-19 NOTE — Telephone Encounter (Signed)
Rx request sent to pharmacy.  

## 2021-01-11 ENCOUNTER — Other Ambulatory Visit: Payer: Self-pay | Admitting: Family

## 2021-01-13 NOTE — Telephone Encounter (Signed)
65f, 68.5kg, scr 0.9 07/08/20, lovw/gollan 12/09/20

## 2021-01-13 NOTE — Telephone Encounter (Signed)
Please review for refill. Thanks!  

## 2021-03-03 ENCOUNTER — Other Ambulatory Visit: Payer: Self-pay | Admitting: Cardiovascular Disease

## 2021-03-17 ENCOUNTER — Other Ambulatory Visit: Payer: Self-pay | Admitting: Cardiovascular Disease

## 2021-05-27 ENCOUNTER — Other Ambulatory Visit: Payer: Self-pay | Admitting: Cardiovascular Disease

## 2021-06-11 ENCOUNTER — Other Ambulatory Visit: Payer: Self-pay

## 2021-06-11 ENCOUNTER — Ambulatory Visit: Payer: Medicare Other | Admitting: Cardiovascular Disease

## 2021-06-11 ENCOUNTER — Encounter: Payer: Self-pay | Admitting: Cardiovascular Disease

## 2021-06-11 VITALS — BP 160/80 | HR 60 | Ht 65.0 in | Wt 148.0 lb

## 2021-06-11 DIAGNOSIS — I480 Paroxysmal atrial fibrillation: Secondary | ICD-10-CM | POA: Diagnosis not present

## 2021-06-11 DIAGNOSIS — I493 Ventricular premature depolarization: Secondary | ICD-10-CM | POA: Diagnosis not present

## 2021-06-11 DIAGNOSIS — I10 Essential (primary) hypertension: Secondary | ICD-10-CM

## 2021-06-11 DIAGNOSIS — R002 Palpitations: Secondary | ICD-10-CM

## 2021-06-11 MED ORDER — METOPROLOL TARTRATE 50 MG PO TABS: 50.0000 mg | ORAL_TABLET | Freq: Two times a day (BID) | ORAL | 3 refills | Status: DC

## 2021-06-11 MED ORDER — LISINOPRIL 20 MG PO TABS: 20.0000 mg | ORAL_TABLET | Freq: Every day | ORAL | 3 refills | Status: DC

## 2021-06-11 NOTE — Patient Instructions (Addendum)
Please monitor blood pressure at home Call the office with numbers  Medication Instructions:  No changes  If you need a refill on your cardiac medications before your next appointment, please call your pharmacy.   Lab work: No new labs needed  Testing/Procedures: No new testing needed   Follow-Up: At [facility], you and your health needs are our priority.  As part of our continuing mission to provide you with exceptional heart care, we have created designated Provider Care Teams.  These Care Teams include your primary Cardiologist (physician) and Advanced Practice Providers (APPs -  Physician Assistants and Nurse Practitioners) who all work together to provide you with the care you need, when you need it.  You will need a follow up appointment in 12 months  Providers on your designated Care Team:   Nicolasa Ducking, NP Eula Listen, PA-C Marisue Ivan, PA-C Cadence Fransico Michael, [place]  Any Other Special Instructions Will Be Listed Below (If Applicable).  COVID-19 Vaccine Information can be found at: [email or web address] For questions related to vaccine distribution or appointments, please email [email or web address] or call [phone].

## 2021-06-11 NOTE — Progress Notes (Signed)
Cardiology Office Note  Date:  06/11/2021   ID:  Brandi Scott, DOB 1929-02-27, MRN [id number]  PCP:  [name], MD   Chief Complaint  Patient presents with   6 month follow up     "Doing well." Medications reviewed by the patient verbally.      HPI:  Brandi Scott is a 87 y.o. female with a PMH mild, asymptomatic valvular disease with mild mitral insufficiency  Holter monitor identified PVCs occasionally in bigeminy.   echocardiogram and Myoview back in 2013   echocardiogram in May of 2015 and Myoview in December 2015  improved overall valve function, and still stable nonischemic Exercise Myoview  decreased exercise capacity., atrial fibrillation  LOV 05/2020 She presents today with her son Scheduled to see the dentist in 2 hours, problem with a filling  Blood pressure elevated 180 on initial check, 160 on my repeat check a systolic  Overall reports feeling well, Denies tachypalpitations concerning for breakthrough atrial fibrillation  No chest pain, no shortness of breath No regular exercise program but active  Taking metoprolol tartrate 50 twice daily Tolerating Eliquis, no recent falls, no bleeding  EKG personally reviewed by myself on todays visit Shows normal sinus rhythm rate 60 bpm  Other past medical history reviewed Event monitor ordered 46 Supraventricular Tachycardia runs occurred,  the longest lasting 19 beats with an avg rate of 110 bpm.  Atrial Fibrillation occurred (<1% burden), ranging from 98-172 bpm (avg of 142 bpm), the longest lasting 15 mins 14 secs with an avg rate of 144 bpm. Atrial Fibrillation was detected within +/- 45 seconds of symptomatic patient event(s).    September  2016.  one spell of rapid heart rates that lasted about 15 minutes triggered by a very frightful situation, having seen a snake in her house.   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  PSH:    Past Surgical History:  Procedure Laterality Date   BREAST EXCISIONAL BIOPSY Right 1971, 1981   negative   CATARACT EXTRACTION W/PHACO Right 03/28/2019   Procedure: CATARACT EXTRACTION PHACO AND INTRAOCULAR LENS PLACEMENT (IOC) RIGHT;  Surgeon: Galen Manila, MD;  Location: [facility];  Service: Ophthalmology;  Laterality: Right;   COLONOSCOPY     Holter Monitor  May 2013   @ [facility] Cardiology: Occasional PVCs (benign)   NM MYOVIEW LTD  May 2013; Dec 2015   @ [facility] Cardiology: a) 2013: Exercise for 2:30 min (only), hypertensive response no ischemic changes. He has 74%, normal wall motion, no evidence of ischemia or infarction.; b) Ex 3:00 min (dyspnea) 3.3 METs HR 146 (106% MPHR) - no ECG changes, E 70%, no RWMA, No ischemia or infarction   ORIF ANKLE FRACTURE Left 09/07/2016   Procedure: OPEN REDUCTION INTERNAL FIXATION (ORIF) ANKLE FRACTURE;  Surgeon: Donato Heinz, MD;  Location: [facility] ORS;  Service: Orthopedics;  Laterality: Left;   TONGUE BIOPSY     TONSILLECTOMY     TOTAL ABDOMINAL HYSTERECTOMY W/ BILATERAL SALPINGOOPHORECTOMY     TRANSTHORACIC ECHOCARDIOGRAM  May 2013; May 2015   @ [facility] Cardiology: a) 2013 : Normal LV function, EF 65%, mild BiAE, Mild LVH, Mod MR, Mod-Severe TR; b) normal LV size, EF> 55%, mild LVH, mild MR, mild TR and    Current Outpatient Medications  Medication Sig Dispense Refill   Calcium Carb-Cholecalciferol 600-800 MG-UNIT TABS Take 1 tablet by mouth daily.     Cholecalciferol 25 MCG (1000 UT) capsule Take 2,000 Units by mouth daily.      ELIQUIS 5 MG TABS tablet Take 1 tablet by mouth twice daily 180 tablet 1   Probiotic Product (PROBIOTIC DAILY PO) Take by mouth daily.     vitamin E 400 UNIT capsule Take 400 Units by mouth daily.     lisinopril (ZESTRIL)  20 MG tablet Take 1 tablet (20 mg total) by mouth daily. 90 tablet 3   metoprolol tartrate (LOPRESSOR) 50 MG tablet Take 1 tablet (50 mg total) by mouth 2 (two) times daily. 180 tablet 3   No current facility-administered medications for this visit.    Allergies:   Patient has no known allergies.   Social History:  The patient  reports that she has never smoked. She has never used smokeless tobacco. She reports that she does not drink alcohol and does not use drugs.   Family History:   family history includes Alzheimer's disease in her mother; Heart attack in her father; Heart disease in her brother and maternal grandmother; Heart failure in her mother; Hypertension in her father; Stomach cancer in her brother.    Review of Systems: Review of Systems  Constitutional: Negative.   HENT: Negative.    Respiratory: Negative.    Cardiovascular:  Positive for palpitations.  Gastrointestinal: Negative.   Musculoskeletal: Negative.   Neurological: Negative.   Psychiatric/Behavioral: Negative.    All other systems reviewed and are negative.  PHYSICAL EXAM: VS:  BP (!) 160/80 (BP Location: Left Arm, Patient Position: Sitting, Cuff Size: Normal)   Pulse 60   Ht 5\' 5"  (1.651 m)   Wt 148 lb (67.1 kg)   SpO2 97%   BMI 24.63 kg/m  , BMI Body mass index is 24.63 kg/m. Constitutional:  oriented to person, place, and time. No distress.  HENT:  Head: Grossly normal Eyes:  no discharge. No scleral icterus.  Neck: No JVD, no carotid bruits  Cardiovascular: Regular rate and rhythm, no murmurs appreciated Pulmonary/Chest: Clear to auscultation bilaterally, no wheezes or rails Abdominal: Soft.  no distension.  no tenderness.  Musculoskeletal: Normal range of motion Neurological:  normal muscle tone. Coordination normal. No atrophy Skin: Skin warm and dry Psychiatric: normal affect, pleasant    Recent Labs: No results found for requested labs within last 8760 hours.    Lipid Panel No  results found for: CHOL, HDL, LDLCALC, TRIG    Wt Readings from Last 3 Encounters:  06/11/21 148 lb (67.1 kg)  12/09/20 151 lb (68.5 kg)  06/10/20 156 lb (70.8 kg)     ASSESSMENT AND PLAN:  Paroxysmal atrial fibrillation Tolerating Eliquis 5mg   twice daily, metoprolol tartrate 50 twice daily No changes to her medications  Essential hypertension - Markedly elevated on initial check, mild improvement on my recheck Possibly nervous with dental appointment in 2 hours We have recommended close monitoring of the blood pressure at home, suggested to call 06/12/20 with some numbers Current regiment, metoprolol twice a day with lisinopril at noon  Frequent PVCs -  Asymptomatic on metoprolol  Mild mitral and tricuspid regurgitation No further work-up at this time Euvolemic  Asymptomatic varicose veins of bilateral lower extremities: Wearing compression hose on today's visit, symptoms stable   Total encounter time more than 25 minutes  Greater than 50% was spent in counseling and coordination of care with the patient  Orders Placed This Encounter  Procedures   EKG 12-Lead      Signed, Dossie Arbour, M.D., Ph.D. 06/11/2021  [facility] [place], [place] [phone]

## 2021-07-04 ENCOUNTER — Other Ambulatory Visit: Payer: Self-pay | Admitting: Cardiovascular Disease

## 2021-07-04 NOTE — Telephone Encounter (Signed)
Please review for refill, thanks ! 

## 2021-07-04 NOTE — Telephone Encounter (Signed)
Eliquis 5 mg refill request received. Patient is 87 years old, weight- 67.1 kg, Crea-0.9  on 07/08/20, Diagnosis-PAF, and last seen by Dr. Mariah Milling on 06/11/21. Dose is appropriate based on dosing criteria. Will send in refill to requested pharmacy.

## 2021-12-15 ENCOUNTER — Other Ambulatory Visit: Payer: Self-pay | Admitting: Cardiovascular Disease

## 2021-12-15 DIAGNOSIS — I480 Paroxysmal atrial fibrillation: Secondary | ICD-10-CM

## 2021-12-15 NOTE — Telephone Encounter (Signed)
Pt last saw Dr Mariah Milling 06/11/21, last labs 07/08/20 Creat 0.9, pt is due for 6 month follow-up with Dr Mariah Milling and overdue for [facility].  Will send message to schedulers to contact pt to make appt. Age 88, weight 67.1kg, will need updated labwork to refill rx.  ?

## 2021-12-16 ENCOUNTER — Telehealth: Payer: Self-pay | Admitting: Cardiovascular Disease

## 2021-12-16 NOTE — Telephone Encounter (Signed)
-----   Message from Brynda Peon, RN sent at 12/15/2021  7:56 AM EDT ----- ?Pt last saw Dr Rockey Situ 06/11/21, last labs 07/08/20 Creat 0.9, pt is due for 6 month follow-up with Dr Rockey Situ (recall in [place]) and overdue for labwork.  Pt requesting refill of Eliquis.  Please call pt to schedule follow-up with Dr Rockey Situ.  Thanks ? ?

## 2021-12-16 NOTE — Telephone Encounter (Signed)
LVM to schedule

## 2021-12-22 ENCOUNTER — Other Ambulatory Visit: Payer: Self-pay

## 2021-12-22 DIAGNOSIS — Z7901 Long term (current) use of anticoagulants: Secondary | ICD-10-CM

## 2021-12-22 DIAGNOSIS — I480 Paroxysmal atrial fibrillation: Secondary | ICD-10-CM

## 2021-12-22 DIAGNOSIS — I479 Paroxysmal tachycardia, unspecified: Secondary | ICD-10-CM

## 2021-12-22 NOTE — Telephone Encounter (Signed)
Scheduled

## 2022-01-01 ENCOUNTER — Other Ambulatory Visit
Admission: RE | Admit: 2022-01-01 | Discharge: 2022-01-01 | Disposition: A | Discharge status: Home / Self Care | Payer: Medicare Other | Source: Ambulatory Visit | Attending: Cardiovascular Disease | Admitting: Cardiovascular Disease

## 2022-01-01 ENCOUNTER — Other Ambulatory Visit: Payer: Self-pay

## 2022-01-01 ENCOUNTER — Telehealth: Payer: Self-pay | Admitting: Cardiovascular Disease

## 2022-01-01 DIAGNOSIS — I480 Paroxysmal atrial fibrillation: Secondary | ICD-10-CM | POA: Diagnosis present

## 2022-01-01 LAB — CBC WITH DIFFERENTIAL/PLATELET
Abs Immature Granulocytes: 0.02 10*3/uL (ref 0.00–0.07)
Basophils Absolute: 0.1 10*3/uL (ref 0.0–0.1)
Basophils Relative: 1 %
Eosinophils Absolute: 0.1 10*3/uL (ref 0.0–0.5)
Eosinophils Relative: 2 %
HCT: 39.7 % (ref 36.0–46.0)
Hemoglobin: 13.3 g/dL (ref 12.0–15.0)
Immature Granulocytes: 0 %
Lymphocytes Relative: 26 %
Lymphs Abs: 1.4 10*3/uL (ref 0.7–4.0)
MCH: 29.9 pg (ref 26.0–34.0)
MCHC: 33.5 g/dL (ref 30.0–36.0)
MCV: 89.2 fL (ref 80.0–100.0)
Monocytes Absolute: 0.5 10*3/uL (ref 0.1–1.0)
Monocytes Relative: 10 %
Neutro Abs: 3.2 10*3/uL (ref 1.7–7.7)
Neutrophils Relative %: 61 %
Platelets: 226 10*3/uL (ref 150–400)
RBC: 4.45 MIL/uL (ref 3.87–5.11)
RDW: 12.7 % (ref 11.5–15.5)
WBC: 5.3 10*3/uL (ref 4.0–10.5)
nRBC: 0 % (ref 0.0–0.2)

## 2022-01-01 LAB — BASIC METABOLIC PANEL
Anion gap: 11 (ref 5–15)
BUN: 23 mg/dL (ref 8–23)
CO2: 22 mmol/L (ref 22–32)
Calcium: 9.5 mg/dL (ref 8.9–10.3)
Chloride: 106 mmol/L (ref 98–111)
Creatinine, Ser: 1.08 mg/dL — ABNORMAL HIGH (ref 0.44–1.00)
GFR, Estimated: 49 mL/min — ABNORMAL LOW (ref >60)
Glucose, Bld: 127 mg/dL — ABNORMAL HIGH (ref 70–99)
Potassium: 3.8 mmol/L (ref 3.5–5.1)
Sodium: 139 mmol/L (ref 135–145)

## 2022-01-01 NOTE — Telephone Encounter (Signed)
Prescription refill request for Eliquis received. ?Indication: Afib  ?Last office visit: 06/11/21 Mariah Milling) ?Scr: 0.9 (07/08/20) ?Age: 88 ?Weight: 67.1kg ? ?Pt labs overdue. Orders have been placed [facility]. Will refill medication once labs are resulted.  ? ? ? ? ? ?

## 2022-01-01 NOTE — Telephone Encounter (Signed)
Called and spoke with Nephew per DPR. Nephew has to work tomorrow and cannot bring pt to her lab appointment. Asked if he could bring her in today instead.  ? ?Explained that we don't have a lab tech today, but that she could come in any day next week. Nephew works 6-7 days a week so it's hard for him to take off work. Told nephew that I could change the lab [facility] so that she could have her labs drawn today there.  ? ?He is waiting on his boss to get to work to ask his if there's anyway he can have tomorrow afternoon off to bring pt in for labs. He will call back to let me know if labs are going to be drawn today or tomorrow.  ?

## 2022-01-01 NOTE — Telephone Encounter (Signed)
Gery Pray, nephew, called back.  ? ?[facility]. Will change lab orders and cancel tomorrow's lab appointment.  ? ?Gery Pray voiced appreciation for the help.  ?

## 2022-01-01 NOTE — Telephone Encounter (Signed)
Patient calling to reschedule nurse visit tomorrow. He states he has to work tomorrow and wants to know if they could come in today. ?

## 2022-01-01 NOTE — Addendum Note (Signed)
Addended by: Mila Merry on: 01/01/2022 10:34 AM ? ? Modules accepted: Orders ? ?

## 2022-01-02 ENCOUNTER — Ambulatory Visit: Payer: Medicare Other

## 2022-01-02 MED ORDER — APIXABAN 5 MG PO TABS: 5.0000 mg | ORAL_TABLET | Freq: Two times a day (BID) | ORAL | 1 refills | Status: DC

## 2022-01-02 NOTE — Telephone Encounter (Signed)
Eliquis 5 mg refill request received. Patient is 88 years old, weight- 67.1 kg, Crea- 1.08 on 01/01/22, Diagnosis- PAF, and last seen by Dr. Rockey Situ on 06/11/21. Dose is appropriate based on dosing criteria. Will send in refill to requested pharmacy.   ?

## 2022-01-05 ENCOUNTER — Telehealth: Payer: Self-pay | Admitting: Emergency Medicine

## 2022-01-05 NOTE — Telephone Encounter (Signed)
Called patient's nephew Gery Pray, per [facility]. Went over results, questions, if any, answered. Gery Pray verbalized understanding and voiced appreciation for the call.  ?

## 2022-01-05 NOTE — Telephone Encounter (Signed)
-----   Message from Antonieta Iba, MD sent at 01/04/2022  1:33 PM EDT ----- ?Lab work reviewed ?Normal BMP, glucose mildly elevated ?Normal CBC ?

## 2022-01-09 NOTE — Telephone Encounter (Signed)
Prescription refill request for Eliquis received. ?Indication: Afib  ?Last office visit:06/11/21 Mariah Milling) ?Scr: 1.08 (01/01/22) ?Age: 88 ?Weight: 67.1kg ? ?Appropriate dose and refill sent to requested pharmacy.  ?

## 2022-03-19 ENCOUNTER — Other Ambulatory Visit: Payer: Self-pay | Admitting: Cardiovascular Disease

## 2022-04-09 ENCOUNTER — Ambulatory Visit: Payer: Medicare Other | Attending: Urology

## 2022-04-09 DIAGNOSIS — M6289 Other specified disorders of muscle: Secondary | ICD-10-CM | POA: Insufficient documentation

## 2022-04-09 DIAGNOSIS — M6281 Muscle weakness (generalized): Secondary | ICD-10-CM | POA: Insufficient documentation

## 2022-04-09 DIAGNOSIS — R278 Other lack of coordination: Secondary | ICD-10-CM | POA: Diagnosis not present

## 2022-04-09 DIAGNOSIS — R293 Abnormal posture: Secondary | ICD-10-CM | POA: Diagnosis present

## 2022-04-09 NOTE — Therapy (Signed)
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 01/2012 MV: EF 74%, no ischemia.   Hyperlipidemia    Osteoarthritis of both hips    And knees as well as back.    PAF (paroxysmal atrial fibrillation) ([place])    a. 10/2017 Event monitor: 46 SVTs (longest 19 beats, avg 110 bpm). Afib (<1% burden w/ avg HR 142)-->Eliquis (CHA2DS2VASc = 4).   Valvular heart disease    a. 2013 Echo: Moderate MR and moderate-severe TR on echocardiogram; b. Echo 2015: mild MR & TR. Nl EF, mild LVH.   Vertigo    none in several years   Past Surgical History:  Procedure Laterality Date   BREAST EXCISIONAL BIOPSY Right 1971, 1981   negative   CATARACT EXTRACTION W/PHACO Right 03/28/2019   Procedure: CATARACT EXTRACTION PHACO AND INTRAOCULAR LENS PLACEMENT ([place]) RIGHT;  Surgeon: Birder Robson, MD;  Location: [facility];  Service: Ophthalmology;  Laterality: Right;   COLONOSCOPY     Holter Monitor  May 2013   @ [facility] Cardiology: Occasional PVCs (benign)   NM MYOVIEW LTD  May 2013; Dec 2015   @ [facility] Cardiology: a) 2013: Exercise for 2:30 min (only), hypertensive response no ischemic changes. He has 74%, normal wall motion, no evidence of ischemia or infarction.; b) Ex 3:00 min (dyspnea) 3.3 METs HR 146 (106% MPHR) - no ECG changes, E 70%, no RWMA, No ischemia  or infarction   ORIF ANKLE FRACTURE Left 09/07/2016   Procedure: OPEN REDUCTION INTERNAL FIXATION (ORIF) ANKLE FRACTURE;  Surgeon: Dereck Leep, MD;  Location: [facility];  Service: Orthopedics;  Laterality: Left;   TONGUE BIOPSY     TONSILLECTOMY     TOTAL ABDOMINAL HYSTERECTOMY W/ BILATERAL SALPINGOOPHORECTOMY     TRANSTHORACIC ECHOCARDIOGRAM  May 2013; May 2015   @ [facility] Cardiology: a) 2013 : Normal LV function, EF 65%, mild BiAE, Mild LVH, Mod MR, Mod-Severe TR; b) normal LV size, EF> 55%, mild LVH, mild MR, mild TR and   Patient Active Problem List   Diagnosis Date Noted   Paroxysmal atrial fibrillation ([place]) 12/30/2017   Paroxysmal tachycardia ([place]) 11/19/2017   Palpitations 11/19/2017   Dizziness 11/19/2017   Ankle fracture, bimalleolar, closed, left, initial encounter 09/07/2016   Tachycardia palpitations 12/14/2014   Frequent PVCs 12/14/2014   Asymptomatic varicose veins of bilateral lower extremities: With mild edema 12/14/2014   Essential hypertension 12/14/2014   Mild mitral and tricuspid regurgitation on recent echo     PCP: Maryland Pink, MD  REFERRING PROVIDER: Royston Cowper, MD   REFERRING DIAG:  N39.41 (ICD-10-CM) - Urge incontinence  N39.3 (ICD-10-CM) - Stress incontinence (female) (female)   THERAPY  DIAG:  Other lack of coordination  Muscle weakness (generalized)  Abnormal posture  Pelvic floor dysfunction  Rationale for Evaluation and Treatment: Rehabilitation  ONSET DATE: 2-3 years but worse about a month ago  RED FLAGS: N/A Have you had any night sweats? Unexplained weight loss? Saddle anesthesia? Unexplained changes in bowel or bladder habits?   SUBJECTIVE: Patient confirms identification and approves PT to assess pelvic floor and treatment Yes                                                                                                                                                                                            PRECAUTIONS: None  WEIGHT BEARING RESTRICTIONS: No  FALLS:  Has patient fallen in last 6 months? No  OCCUPATION/SOCIAL ACTIVITIES: cooking, household chores, walking, would like to walk outdoors or in the mall more  PLOF: Independent with basic ADLs, Independent with household mobility without device, and Independent with community mobility with device   CHIEF CONCERN: Pt arrived with quad cane. Pt sprained R ankle twice when she was younger and ever since has had problems with her R leg. Pt describes it as the R leg feels unstable at times. Pt only uses the quad cane when outside the home. Pt does have a seat in the shower and is IND with ADLs and mobility. Pt has noticed urinary leakage after sitting/lying down for prolonged periods and then goes to stand up. Pt had a cortisone shot in the R shoulder and feels her urinary leakage has been worse since then (a month ago). The Dr told the Pt to drink more water and she has been but it causes more leakage. Pt reports not feeling a sensation of needing to go to the bathroom, but she makes herself go because she assumes her bladder is full. Pt has been wearing pantyliners for many years and did not mind that but now has to wear briefs due to the amount of leakage. Pt also notices leakage with sneezing/coughing/prolonged activity/lifting.    PAIN:  Are you having pain? Yes NPRS scale: 8/10 (worst), 0/10 current Pain location: R knee  Pain type: aching Pain description: intermittent    LIVING ENVIRONMENT: Lives with: lives with their family (nephew) Lives in: House/apartment Stairs: has a ramp with B rails  Has following equipment at home: Gaffer - 2 wheeled   PATIENT GOALS: Wants to be able to feel the urge to go to the bathroom, not leak as much or wear a brief (return to pantyliners)    UROLOGICAL HISTORY Fluid intake: Yes: water (up to 3 12 oz), unsweetened Kool-Aid  with Stevia (2-3 tall cups/day)    Pain with urination: No Fully empty bladder: Yes but after she uses the bathroom, she has more leakage even though she just emptied  Stream: Weak, constant  Urgency: No  Toileting posture: feet flat Frequency: 6x "Just in case": yes Nocturia: 3x but feels little urge, she makes herself go Leakage: Urge to void, Walking to the bathroom, Coughing, Sneezing, Lifting, and Bending forward Pads: Yes Type: briefs Amount: 2-3x/day Bladder control (0-10): 4/10  GASTROINTESTINAL HISTORY Pain with bowel movement: No Type of bowel movement:Type (Bristol Stool Scale) 1 and 4 and Strain Yes Frequency: 1x/day Fully empty rectum: Yes   SEXUAL HISTORY/FUNCTION Pt has no concerns    OBSTETRICAL HISTORY- G0P0     GYNECOLOGICAL HISTORY Hysterectomy: yes, abdominal hysterectomy  Pelvic Organ Prolapse: None Pain with exam:  Heaviness/pressure:   OBJECTIVE:   DIAGNOSTIC TESTING/IMPRESSIONS:    COGNITION: Overall cognitive status: Within functional limits for tasks assessed     POSTURE:  B rounded shoulder in sitting, increased thoracic kyphosis, increased B hip adduction, posterior pelvic tilt in sitting  Lumbar lordosis:  Deferred 2/2 time constraints  Thoracic kyphosis: Iliac crest height:  Lumbar lateral shift:  Pelvic obliquity:  Leg length discrepancy:   GAIT: Deferred 2/2 time constraints  Distance walked:  Assistive device utilized:  Level of assistance:  Comments:   Trendelenburg:   SENSATION: Deferred 2/2 time constraints  Light touch: , L2-S2 dermatomes  Proprioception:    RANGE OF MOTION:  Deferred 2/2 time constraints   (Norm range in degrees)  LEFT eval RIGHT eval  Lumbar forward flexion (65):      Lumbar extension (30):     Lumbar lateral flexion (25):     Thoracic and Lumbar rotation (30 degrees):       Hip Flexion (0-125):      Hip IR (0-45):     Hip ER (0-45):     Hip Adduction:      Hip Abduction (0-40):     Hip extension (0-15):     (*=  pain, Blank rows = not tested)   STRENGTH: MMT  Deferred 2/2 time constraints   RLE eval LLE eval  Hip Flexion    Hip Extension    Hip Abduction     Hip Adduction     Hip ER     Hip IR     Knee Extension    Knee Flexion    Dorsiflexion     Plantarflexion (seated)    (*= pain, Blank rows = not tested)   SPECIAL TESTS: Deferred 2/2 time constraints  Centralization and Peripheralization (SN 92, -LR 0.12):  Slump (SN 83, -LR 0.32):  SLR (SN 92, -LR 0.29): R: Lumbar quadrant (SN 70): R:  FABER (SN 81): FADIR (SN 94):  Hip scour (SN 50):  Thigh Thrust (SN 88, -LR 0.18) : Distraction (ZO10):  Compression (SN/SP 69): Stork/March (SP 93):   PHYSICAL PERFORMANCE MEASURES: Deferred 2/2 time constraints   :  5TSTS:   PALPATION: Abdominal:  Diastasis:  finger above umbilicus,  fingers at and below umbilicus  Scar mobility: present/mobile perpendicular, parallel Rib flare: present/absent  EXTERNAL PELVIC EXAM: Patient educated on the purpose of the pelvic exam and articulated understanding; patient consented to the exam verbally. Deferred 2/2 time constraints  Palpation: Breath coordination: present/absent/inconsistent Voluntary Contraction: present/absent Relaxation: full/delayed/non-relaxing Perineal movement with sustained IAP increase ("bear down"): descent/no change/elevation/excessive descent Perineal movement with rapid IAP increase ("cough"): elevation/no change/descent Pubic symphysis: (0= no contraction,  1= flicker, 2= weak squeeze, 3= fair squeeze with lift, 4= good squeeze and lift against resistance, 5= strong squeeze against strong resistance)   INTERNAL PELVIC EXAM: Patient educated on the purpose of the pelvic exam and articulated understanding; patient consented to the exam verbally. Not applicable to Pt at this time.  Introitus Appears:  Skin integrity:  Scar mobility: Strength (PERF):  Symmetry: Palpation: Prolapse: (0= no contraction, 1=  flicker, 2= weak squeeze, 3= fair squeeze with lift, 4= good squeeze and lift against resistance, 5= strong squeeze against strong resistance)    Patient Education:  Patient educated on what to expect during course of physical therapy, POC, and provided with HEP including: toileting posture handout and discussion on bladder irritants. Patient verbalized understanding but noticeable apprehension in continuing with PFPT due to the length of time it can take to see improvements. Patient will benefit from further education in order to maximize compliance and understanding for long-term therapeutic gains.   Patient Surveys:  FOTO - Pt did not finish, will assess next visit     ASSESSMENT:  Clinical Impression: Patient is a 88 y.o. who was seen today for physical therapy evaluation and treatment for a chief concern of urinary leakage. Today's evaluation suggest deficits in IAP management, PFM coordination, PFM strength, PFM endurance, sensation, posture, mobility, pain, balance, as evidenced by use of quad cane out in the community with downward gaze, no use of AD in the home, B rounded shoulders in sitting (increased thoracic kyphosis), posterior pelvic tilt in sitting, B hip adduction in sitting (increase PFM tension), R knee worst pain 8/10 (NPRS), urinary leakage with transfers/bending/lifting/prolonged activity/sneezing/coughing, use of incontinence briefs within the past month due to amount of leakage, and decreased urgency to use the restroom. Patient will finish completing FOTO outcome measure next visit. Patient's progress may be limited due to age related changes and compliance with HEP. Pt with basic understanding of PFM function in posture, bowel/bladder habits, the deep core, and breathing mechanics. Patient will benefit from skilled therapeutic intervention to address deficits in IAP management, PFM coordination, PFM strength, PFM endurance, sensation, posture, pain, balance in order to  increase PLOF and improve overall QOL.    Objective Impairments: Abnormal gait, decreased activity tolerance, decreased balance, decreased coordination, decreased endurance, decreased mobility, decreased strength, improper body mechanics, postural dysfunction, and pain.   Activity Limitations: lifting, bending, sitting, standing, squatting, sleeping, transfers, continence, toileting, and locomotion level  Personal Factors: Age, Behavior pattern, Past/current experiences, Time since onset of injury/illness/exacerbation, and 1-2 comorbidities: hx of breast cancer and A-fib  are also affecting patient's functional outcome.   Rehab Potential: Fair - age related changes may inhibit progression towards long-term goals and compliance with HEP as improvements in PFM coordination/strength take time.   Clinical Decision Making: Evolving/moderate complexity  Evaluation Complexity: Moderate   GOALS: Goals reviewed with patient? Yes  SHORT TERM GOALS: Target date: 05/22/2022  Patient will decrease intake of caffeinated beverages and other bladder irritants to less than/= 2 cups and report increased water hydration in order to manage urinary leakage and bladder irritation for improved overall QOL and participation at home and in the community. Baseline: unsweetened Kool-Aid with stevia (up to 3 tall cups a day) Goal status: INITIAL    LONG TERM GOALS: Target date: 07/03/2022   Patient will improve score on FOTO Pelvic Floor outcome measure by at least 5-6 points in order to demonstrate improved IAP management, improved PFM coordination, and overall improved QOL.  Baseline: will finish  FOTO next visit  Goal status: INITIAL  2.  Patient will report decreased reliance on protective undergarments as indicated by a 24 hour period to demonstrate improved bladder control and allow for increased participation in activities outside of the home. Baseline: incontinence briefs 2-3x/day Goal status:  INITIAL  3.  Patient will report confidence in ability to control bladder > 7/10 in order to demonstrate improved function and ability to participate more fully in activities at home and in the community. Baseline: 4/10 Goal status: INITIAL  4.  Patient will participate in a 5TSTS test and demonstrate improved scores to match age-related norms (14 secs or less) to decrease risk in falls and overall balance as Pt has increased urinary leakage and does not use AD in the home.  Baseline: will assess next visit Goal status: INITIAL  5.  Patient will report less than 5 incidents of stress urinary incontinence over the course of 3 weeks while coughing/sneezing/laughing/prolonged activity in order to demonstrate improved PFM coordination, strength, and function for improved overall QOL. Baseline: every time with all the above  Goal status: INITIAL    PLAN: PT Frequency: 1x/week  PT Duration: 12 weeks  Planned Interventions: Therapeutic exercises, Therapeutic activity, Neuromuscular re-education, Balance training, Gait training, Patient/Family education, Self Care, Joint mobilization, Spinal mobilization, Cryotherapy, Moist heat, scar mobilization, Taping, and Manual therapy  Plan For Next Session: phys assess, begin breathing/deep core?  Gianah Batt, PT, DPT  04/10/2022, 7:59 AM

## 2022-04-16 ENCOUNTER — Ambulatory Visit: Payer: Medicare Other

## 2022-04-16 DIAGNOSIS — R278 Other lack of coordination: Secondary | ICD-10-CM

## 2022-04-16 DIAGNOSIS — R293 Abnormal posture: Secondary | ICD-10-CM

## 2022-04-16 DIAGNOSIS — M6289 Other specified disorders of muscle: Secondary | ICD-10-CM

## 2022-04-16 DIAGNOSIS — M6281 Muscle weakness (generalized): Secondary | ICD-10-CM

## 2022-04-16 NOTE — Therapy (Signed)
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 01/2012 MV: EF 74%, no ischemia.   Hyperlipidemia    Osteoarthritis of both hips    And knees as well as back.    PAF (paroxysmal atrial fibrillation) (HCC)    a. 10/2017 Event monitor: 46 SVTs (longest 19 beats, avg 110 bpm). Afib (<1% burden w/ avg HR 142)-->Eliquis (CHA2DS2VASc = 4).   Valvular heart disease    a. 2013 Echo: Moderate MR and moderate-severe TR on echocardiogram; b. Echo 2015: mild MR & TR. Nl EF, mild LVH.   Vertigo    none in several years   Past Surgical History:  Procedure Laterality Date   BREAST EXCISIONAL BIOPSY Right 1971, 1981   negative   CATARACT EXTRACTION W/PHACO Right 03/28/2019   Procedure: CATARACT EXTRACTION PHACO AND INTRAOCULAR LENS PLACEMENT (IOC) RIGHT;  Surgeon: Galen Manila, MD;  Location: [facility];  Service: Ophthalmology;  Laterality: Right;   COLONOSCOPY     Holter Monitor  May 2013   @ [facility]iology: Occasional PVCs (benign)   NM MYOVIEW LTD  May 2013; Dec 2015   @ [facility] Cardiology: a) 2013: Exercise for 2:30 min (only), hypertensive response no ischemic changes. He has 74%, normal wall motion, no evidence of ischemia or infarction.; b) Ex 3:00 min (dyspnea) 3.3 METs HR 146 (106% MPHR) - no ECG changes, E 70%, no RWMA, No ischemia or infarction   ORIF ANKLE  FRACTURE Left 09/07/2016   Procedure: OPEN REDUCTION INTERNAL FIXATION (ORIF) ANKLE FRACTURE;  Surgeon: Donato Heinz, MD;  Location: [facility];  Service: Orthopedics;  Laterality: Left;   TONGUE BIOPSY     TONSILLECTOMY     TOTAL ABDOMINAL HYSTERECTOMY W/ BILATERAL SALPINGOOPHORECTOMY     TRANSTHORACIC ECHOCARDIOGRAM  May 2013; May 2015   @ [facility]: a) 2013 : Normal LV function, EF 65%, mild BiAE, Mild LVH, Mod MR, Mod-Severe TR; b) normal LV size, EF> 55%, mild LVH, mild MR, mild TR and   Patient Active Problem List   Diagnosis Date Noted   Paroxysmal atrial fibrillation (HCC) 12/30/2017   Paroxysmal tachycardia (HCC) 11/19/2017   Palpitations 11/19/2017   Dizziness 11/19/2017   Ankle fracture, bimalleolar, closed, left, initial encounter 09/07/2016   Tachycardia palpitations 12/14/2014   Frequent PVCs 12/14/2014   Asymptomatic varicose veins of bilateral lower extremities: With mild edema 12/14/2014   Essential hypertension 12/14/2014   Mild mitral and tricuspid regurgitation on recent echo     PCP: [name], MD  REFERRING PROVIDER: Orson Ape, MD   REFERRING DIAG:  N39.41 (ICD-10-CM) - Urge incontinence  N39.3 (ICD-10-CM) - Stress incontinence (female) (female)   THERAPY DIAG:  Other lack of coordination  Muscle weakness (generalized)  Abnormal posture  Pelvic floor dysfunction  Rationale for Evaluation and Treatment: Rehabilitation  ONSET DATE: 2-3 years but worse about a month ago  PRECAUTIONS: None  WEIGHT BEARING RESTRICTIONS: No  FALLS:  Has patient fallen in last 6 months? No  OCCUPATION/SOCIAL ACTIVITIES: cooking, household chores, walking, would like to walk outdoors or in the mall more  PLOF: Independent with basic ADLs, Independent with household mobility without device, and Independent with community mobility with device   CHIEF CONCERN: Pt arrived with quad cane. Pt sprained R ankle twice when she was younger and ever  since has had problems with her R leg. Pt describes it as the R leg feels unstable at times. Pt only uses the quad cane when outside the home. Pt does have a seat in the shower and is IND with ADLs and mobility. Pt has noticed urinary leakage after sitting/lying down for prolonged periods and then goes to stand up. Pt had a cortisone shot in the R shoulder and feels her urinary leakage has been worse since then (a month ago). The Dr told the Pt to drink more water and she has been but it causes more leakage. Pt reports not feeling a sensation of needing to go to the bathroom, but she makes herself go because she assumes her bladder is full. Pt has been wearing pantyliners for many years and did not mind that but now has to wear briefs due to the amount of leakage. Pt also notices leakage with sneezing/coughing/prolonged activity/lifting.     PATIENT GOALS: Wants to be able to feel the urge to go to the bathroom, not leak as much or wear a brief (return to pantyliners)   UROLOGICAL HISTORY Fluid intake: Yes: water (up to 3 12 oz), unsweetened Kool-Aid with Stevia (2-3 tall cups/day)   Pain with urination: No Fully empty bladder: Yes but after she uses the bathroom, she has more leakage even though she just emptied  Stream: Weak, constant  Urgency: No  Toileting posture: feet flat Frequency: 6x "Just in case": yes Nocturia: 3x but feels little urge, she makes herself go Leakage: Urge to void, Walking to the bathroom, Coughing, Sneezing, Lifting, and Bending forward Pads: Yes Type: briefs Amount: 2-3x/day Bladder control (0-10): 4/10  GASTROINTESTINAL HISTORY Pain with bowel movement: No Type of bowel movement:Type (Bristol Stool Scale) 1 and 4 and Strain Yes Frequency: 1x/day Fully empty rectum: Yes   SEXUAL HISTORY/FUNCTION Pt has no concerns    OBSTETRICAL HISTORY- G0P0    GYNECOLOGICAL HISTORY Hysterectomy: yes, abdominal hysterectomy  Pelvic Organ Prolapse: None Pain with  exam:  Heaviness/pressure:  SUBJECTIVE:  Pt feels that she is having less leakage than last week but cannot tell a big difference. Pt is using quad cane on the R side because it feels "different" in the L hand. Pt feels her R knee is going to give out at times and it feels broken. Pt has not had a recent x-ray nor has consulted an orthopedic doctor.   PAIN:  Are you having pain? No    TODAY'S TREATMENT   Neuromuscular re-education:  Pre-treatment assessment  OBJECTIVE:    COGNITION: Overall cognitive status: Within functional limits for tasks assessed     POSTURE:   Lumbar lordosis: decreased in standing  Thoracic kyphosis: significant in standing and sitting    GAIT:  Distance walked: 100 ft Assistive device utilized: quad cane Level of assistance: CGA  Comments:  Pt feels at times her R knee  wants to give out with the quad cane. Pt also felt urinary leakage while walking.   SENSATION: Deferred 2/2 time constraints  Light touch: , L2-S2 dermatomes  Proprioception:    RANGE OF MOTION:    (Norm range in degrees)  LEFT 04/16/22 RIGHT 04/16/22  Lumbar forward flexion (65):  WNL    Lumbar extension (30): Restricted    Lumbar lateral flexion (25):  WNL WNL  Thoracic and Lumbar rotation (30 degrees):    WNL WNL  Hip Flexion (0-125):   WNL WNL  Hip IR (0-45):  WNL WNL  Hip ER (0-45):  WNL WNL  Hip Adduction:      Hip Abduction (0-40):  WNL WNL  Hip extension (0-15):     (*= pain, Blank rows = not tested)   STRENGTH: MMT    RLE 04/16/22 LLE 04/16/22  Hip Flexion 4 (backward lean) 4 (backward lean)  Hip Extension 4 4  Hip Abduction     Hip Adduction     Hip ER  5 5  Hip IR  5 5  Knee Extension 5 5  Knee Flexion 4 4  Dorsiflexion     Plantarflexion (seated) 5 5  (*= pain, Blank rows = not tested)   SPECIAL TESTS:  FABER (SN 81): negative B FADIR (SN 94): negative B   PHYSICAL PERFORMANCE MEASURES:   5TSTS: 19 seconds    PALPATION: Abdominal:   Diastasis:  none Rib flare: present B  EXTERNAL PELVIC EXAM: Patient educated on the purpose of the pelvic exam and articulated understanding; patient consented to the exam verbally.  Breath coordination: inconsistent Voluntary Contraction: present, 2/5 MMT with adductor and abdominal activation  Relaxation: delayed Perineal movement with sustained IAP increase ("bear down"): descent, with significant cueing for technique, Valsalva noted Perineal movement with rapid IAP increase ("cough"): no change  (0= no contraction, 1= flicker, 2= weak squeeze, 3= fair squeeze with lift, 4= good squeeze and lift against resistance, 5= strong squeeze against strong resistance)    Neuromuscular Re-education Discussion on proper AD to use out in the community (RW vs quad cane) especially as Pt feels the R knee is going to give out. DPT requested to bring RW next time to adjust height if needed and explained the importance of stability in the home and in the community to reduce risk of falls.    Supine hooklying diaphragmatic breathing with VCs and TCs for downregulation of the nervous system and improved management of IAP  Discussion and demonstration on log roll technique for improved IAP management  Cueing for improved breathing mechanics to decrease Valsalva maneuver    Patient response to interventions: Pt reports it taking some concentration to understand how her breath and PFM are connected   Patient Education:  Patient provided with HEP: supine diaphragmatic breathing. Patient verbalized understanding. Patient educated throughout session on appropriate technique and form using multi-modal cueing, HEP, and activity modification. Patient will benefit from further education in order to maximize compliance and understanding for long-term therapeutic gains.   Patient Surveys:  FOTO - Pt did not finish, will assess next visit     ASSESSMENT:  Clinical Impression: Patient presents to clinic  with excellent motivation to participate in today's session. Pt arrived to clinic with use of quad cane in the R hand but observed to be unstable at times. Discussion (see above) on use of RW in the home and in the community to reduce falls. Upon physical assessment, Pt demonstrates deficits in IAP management, PFM  coordination, PFM strength,  posture, mobility, balance, and LE strength as evidenced by decreased lumbar lordosis in standing, significant thoracic kyphosis in sitting/standing, requiring CGA with use of quad cane during 115ft of gait and observed to have instability at the R knee, bodily compensation (backward lean) with hip flexion MMT, 4/5 MMT in hip flexion/extension and knee flexion, 19 seconds on the 5TSTS (increase risk for falls), urinary leakage (per Pt) during 161ft of gait, B rib flare, inconsistent breath coordination, 2/5 MMT PFM with adductor and abdominal activation, delayed PFM relaxation, and Valsalva maneuver noted throughout PFM external exam. Pt required moderate VCs and TCs on diaphragmatic breathing (pursed-lip technique) and given visual representation of PFM/diaphragm connection. Pt responded well to active and educational interventions. Patient will continue to benefit from skilled therapeutic intervention to address deficits in IAP management, PFM coordination, PFM strength, posture, mobility, balance, and LE strength in order to increase PLOF and improve overall QOL.    Objective Impairments: Abnormal gait, decreased activity tolerance, decreased balance, decreased coordination, decreased endurance, decreased mobility, decreased strength, improper body mechanics, postural dysfunction, and pain.   Activity Limitations: lifting, bending, sitting, standing, squatting, sleeping, transfers, continence, toileting, and locomotion level  Personal Factors: Age, Behavior pattern, Past/current experiences, Time since onset of injury/illness/exacerbation, and 1-2 comorbidities: hx of  breast cancer and A-fib  are also affecting patient's functional outcome.   Rehab Potential: Fair - age related changes may inhibit progression towards long-term goals and compliance with HEP as improvements in PFM coordination/strength take time.   Clinical Decision Making: Evolving/moderate complexity  Evaluation Complexity: Moderate   GOALS: Goals reviewed with patient? Yes  SHORT TERM GOALS: Target date: 05/28/2022  Patient will decrease intake of caffeinated beverages and other bladder irritants to less than/= 2 cups and report increased water hydration in order to manage urinary leakage and bladder irritation for improved overall QOL and participation at home and in the community. Baseline: unsweetened Kool-Aid with stevia (up to 3 tall cups a day) Goal status: INITIAL    LONG TERM GOALS: Target date: 07/09/2022   Patient will improve score on FOTO Pelvic Floor outcome measure by at least 5-6 points in order to demonstrate improved IAP management, improved PFM coordination, and overall improved QOL.  Baseline: will finish FOTO next visit  Goal status: INITIAL  2.  Patient will report decreased reliance on protective undergarments as indicated by a 24 hour period to demonstrate improved bladder control and allow for increased participation in activities outside of the home. Baseline: incontinence briefs 2-3x/day Goal status: INITIAL  3.  Patient will report confidence in ability to control bladder > 7/10 in order to demonstrate improved function and ability to participate more fully in activities at home and in the community. Baseline: 4/10 Goal status: INITIAL  4.  Patient will participate in a 5TSTS test and demonstrate improved scores to match age-related norms (14 secs or less) to decrease risk in falls and overall balance as Pt has increased urinary leakage and does not use AD in the home.  Baseline: (7/20): 19 seconds  Goal status: INITIAL  5.  Patient will report less  than 5 incidents of stress urinary incontinence over the course of 3 weeks while coughing/sneezing/laughing/prolonged activity in order to demonstrate improved PFM coordination, strength, and function for improved overall QOL. Baseline: every time with all the above  Goal status: INITIAL    PLAN: PT Frequency: 1x/week  PT Duration: 12 weeks  Planned Interventions: Therapeutic exercises, Therapeutic activity, Neuromuscular re-education, Balance training,  Gait training, Patient/Family education, Self Care, Joint mobilization, Spinal mobilization, Cryotherapy, Moist heat, scar mobilization, Taping, and Manual therapy  Plan For Next Session: FOTO, how was breathing? Begin deep core, posture  Tyress Loden, PT, DPT  04/16/2022, 2:46 PM

## 2022-04-23 ENCOUNTER — Ambulatory Visit: Payer: Medicare Other

## 2022-04-23 DIAGNOSIS — R293 Abnormal posture: Secondary | ICD-10-CM

## 2022-04-23 DIAGNOSIS — M6281 Muscle weakness (generalized): Secondary | ICD-10-CM

## 2022-04-23 DIAGNOSIS — R278 Other lack of coordination: Secondary | ICD-10-CM | POA: Diagnosis not present

## 2022-04-23 DIAGNOSIS — M6289 Other specified disorders of muscle: Secondary | ICD-10-CM

## 2022-04-23 NOTE — Therapy (Addendum)
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 01/2012 MV: EF 74%, no ischemia.   Hyperlipidemia    Osteoarthritis of both hips    And knees as well as back.    PAF (paroxysmal atrial fibrillation) (HCC)    a. 10/2017 Event monitor: 46 SVTs (longest 19 beats, avg 110 bpm). Afib (<1% burden w/ avg HR 142)-->Eliquis (CHA2DS2VASc = 4).   Valvular heart disease    a. 2013 Echo: Moderate MR and moderate-severe TR on echocardiogram; b. Echo 2015: mild MR & TR. Nl EF, mild LVH.   Vertigo    none in several years   Past Surgical History:  Procedure Laterality Date   BREAST EXCISIONAL BIOPSY Right 1971, 1981   negative   CATARACT EXTRACTION W/PHACO Right 03/28/2019   Procedure: CATARACT EXTRACTION PHACO AND INTRAOCULAR LENS PLACEMENT (IOC) RIGHT;  Surgeon: Galen Manila, MD;  Location: [facility];  Service: Ophthalmology;  Laterality: Right;   COLONOSCOPY     Holter Monitor  May 2013   @ [facility] Cardiology: Occasional PVCs (benign)   NM MYOVIEW LTD  May 2013; Dec 2015   @ [facility] Cardiology: a) 2013: Exercise for 2:30 min (only), hypertensive response no ischemic changes. He has 74%, normal wall motion, no evidence of ischemia or infarction.; b) Ex 3:00 min (dyspnea) 3.3 METs HR 146 (106% MPHR) - no ECG changes, E 70%, no RWMA, No ischemia or infarction   ORIF ANKLE  FRACTURE Left 09/07/2016   Procedure: OPEN REDUCTION INTERNAL FIXATION (ORIF) ANKLE FRACTURE;  Surgeon: Donato Heinz, MD;  Location: [facility];  Service: Orthopedics;  Laterality: Left;   TONGUE BIOPSY     TONSILLECTOMY     TOTAL ABDOMINAL HYSTERECTOMY W/ BILATERAL SALPINGOOPHORECTOMY     TRANSTHORACIC ECHOCARDIOGRAM  May 2013; May 2015   @ [facility] Cardiology: a) 2013 : Normal LV function, EF 65%, mild BiAE, Mild LVH, Mod MR, Mod-Severe TR; b) normal LV size, EF> 55%, mild LVH, mild MR, mild TR and   Patient Active Problem List   Diagnosis Date Noted   Paroxysmal atrial fibrillation (HCC) 12/30/2017   Paroxysmal tachycardia (HCC) 11/19/2017   Palpitations 11/19/2017   Dizziness 11/19/2017   Ankle fracture, bimalleolar, closed, left, initial encounter 09/07/2016   Tachycardia palpitations 12/14/2014   Frequent PVCs 12/14/2014   Asymptomatic varicose veins of bilateral lower extremities: With mild edema 12/14/2014   Essential hypertension 12/14/2014   Mild mitral and tricuspid regurgitation on recent echo     PCP: [name], MD  REFERRING PROVIDER: Orson Ape, MD   REFERRING DIAG:  N39.41 (ICD-10-CM) - Urge incontinence  N39.3 (ICD-10-CM) - Stress incontinence (female) (female)   THERAPY DIAG:  Other lack of coordination  Muscle weakness (generalized)  Abnormal posture  Pelvic floor dysfunction  Rationale for Evaluation and Treatment: Rehabilitation  ONSET DATE: 2-3 years but worse about a month ago  PRECAUTIONS: None  WEIGHT BEARING RESTRICTIONS: No  FALLS:  Has patient fallen in last 6 months? No  OCCUPATION/SOCIAL ACTIVITIES: cooking, household chores, walking, would like to walk outdoors or in the mall more  PLOF: Independent with basic ADLs, Independent with household mobility without device, and Independent with community mobility with device   CHIEF CONCERN: Pt arrived with quad cane. Pt sprained R ankle twice when she was younger and ever  since has had problems with her R leg. Pt describes it as the R leg feels unstable at times. Pt only uses the quad cane when outside the home. Pt does have a seat in the shower and is IND with ADLs and mobility. Pt has noticed urinary leakage after sitting/lying down for prolonged periods and then goes to stand up. Pt had a cortisone shot in the R shoulder and feels her urinary leakage has been worse since then (a month ago). The Dr told the Pt to drink more water and she has been but it causes more leakage. Pt reports not feeling a sensation of needing to go to the bathroom, but she makes herself go because she assumes her bladder is full. Pt has been wearing pantyliners for many years and did not mind that but now has to wear briefs due to the amount of leakage. Pt also notices leakage with sneezing/coughing/prolonged activity/lifting.     PATIENT GOALS: Wants to be able to feel the urge to go to the bathroom, not leak as much or wear a brief (return to pantyliners)   UROLOGICAL HISTORY Fluid intake: Yes: water (up to 3 12 oz), unsweetened Kool-Aid with Stevia (2-3 tall cups/day)   Pain with urination: No Fully empty bladder: Yes but after she uses the bathroom, she has more leakage even though she just emptied  Stream: Weak, constant  Urgency: No  Toileting posture: feet flat Frequency: 6x "Just in case": yes Nocturia: 3x but feels little urge, she makes herself go Leakage: Urge to void, Walking to the bathroom, Coughing, Sneezing, Lifting, and Bending forward Pads: Yes Type: briefs Amount: 2-3x/day Bladder control (0-10): 4/10  GASTROINTESTINAL HISTORY Pain with bowel movement: No Type of bowel movement:Type (Bristol Stool Scale) 1 and 4 and Strain Yes Frequency: 1x/day Fully empty rectum: Yes   SEXUAL HISTORY/FUNCTION Pt has no concerns    OBSTETRICAL HISTORY- G0P0    GYNECOLOGICAL HISTORY Hysterectomy: yes, abdominal hysterectomy  Pelvic Organ Prolapse: None Pain with  exam:  Heaviness/pressure:  SUBJECTIVE:  Pt brought RW to today's visit to have it measured properly and to assess gait. Pt did have a period of diarrhea and headache from which she thinks came from medicine for her bladder. Pt is able to report improved leakage especially at night as she feels the urine isn't running out as much.    PAIN:  Are you having pain? No    OBJECTIVE:    COGNITION: Overall cognitive status: Within functional limits for tasks assessed     POSTURE: (04/23/22)  Lumbar lordosis: decreased in standing  Thoracic kyphosis: significant in standing and sitting  Iliac crest - WNL Pelvic obliquity - R slightly posteriorly rotated  Increased R shoulder elevation    GAIT:  Distance walked: 100 ft Assistive device utilized: quad cane Level of assistance: CGA  Comments:  Pt feels at times her R knee wants  to give out with the quad cane. Pt also felt urinary leakage while walking.   SENSATION: Deferred 2/2 time constraints  Light touch: , L2-S2 dermatomes  Proprioception:    RANGE OF MOTION:    (Norm range in degrees)  LEFT 04/16/22 RIGHT 04/16/22  Lumbar forward flexion (65):  WNL    Lumbar extension (30): Restricted    Lumbar lateral flexion (25):  WNL WNL  Thoracic and Lumbar rotation (30 degrees):    WNL WNL  Hip Flexion (0-125):   WNL WNL  Hip IR (0-45):  WNL WNL  Hip ER (0-45):  WNL WNL  Hip Adduction:      Hip Abduction (0-40):  WNL WNL  Hip extension (0-15):     (*= pain, Blank rows = not tested)   STRENGTH: MMT    RLE 04/16/22 LLE 04/16/22  Hip Flexion 4 (backward lean) 4 (backward lean)  Hip Extension 4 4  Hip Abduction     Hip Adduction     Hip ER  5 5  Hip IR  5 5  Knee Extension 5 5  Knee Flexion 4 4  Dorsiflexion     Plantarflexion (seated) 5 5  (*= pain, Blank rows = not tested)   SPECIAL TESTS:  FABER (SN 81): negative B FADIR (SN 94): negative B   PHYSICAL PERFORMANCE MEASURES:   5TSTS: 19 seconds     PALPATION: Abdominal:  Diastasis:  none Rib flare: present B  EXTERNAL PELVIC EXAM: Patient educated on the purpose of the pelvic exam and articulated understanding; patient consented to the exam verbally.  Breath coordination: inconsistent Voluntary Contraction: present, 2/5 MMT with adductor and abdominal activation  Relaxation: delayed Perineal movement with sustained IAP increase ("bear down"): descent, with significant cueing for technique, Valsalva noted Perineal movement with rapid IAP increase ("cough"): no change  (0= no contraction, 1= flicker, 2= weak squeeze, 3= fair squeeze with lift, 4= good squeeze and lift against resistance, 5= strong squeeze against strong resistance)   TODAY'S TREATMENT:  Neuromuscular Re-education Pre-treatment assessment - posture - see above  Assessment of gait [id number] with RW Equal stride length, increased R shoulder height Required VC to decrease B shoulder elevation during gait Pt report being more stable using the RW   Discussion on use of RW out in the community and time spent measuring height of RW to Pt's wrist crease  Supine hooklying diaphragmatic breathing with VCs and TCs for downregulation of the nervous system and improved management of IAP  Seated hooklying diaphragmatic breathing with VCs and TCs for downregulation of the nervous system and improved management of IAP  [facility] abdominal rehab   Supine hooklying TrA contraction with coordinated exhale   Seated TrA activation with coordinated exhale for improved IAP management, VCs and Tcs required    Patient response to interventions: Pt reports feeling a subtle tightening of the deep core more in seated. Responded well to bellybutton towards spine.    Patient Education:  Patient provided with HEP: supine and seated TrA contraction, seated diaphragmatic breathing. Patient verbalized understanding. Patient educated throughout session on appropriate technique and form  using multi-modal cueing, HEP, and activity modification. Patient will benefit from further education in order to maximize compliance and understanding for long-term therapeutic gains.   Patient Surveys:  FOTO - Urinary Problem 40     ASSESSMENT:  Clinical Impression: Patient presents to clinic with excellent motivation to participate in today's session. Pt continues to demonstrate deficits in IAP management, PFM coordination, PFM strength,  posture, mobility, balance, and LE strength. Upon assessment of posture, Pt with increased R shoulder height and increased R scapular winging. Pt arrived to clinic with RW and report feeling more stable as the R knee occasionally feel unsteady. DPT gave VCs to reduce elevation of B shoulders and forward gaze. DPT advised Pt to use RW out in the community for increased IND and decrease risk of falls. Pt required significant cueing for deep core activation in order to avoid forceful activation of rectus abd/obliques. After increased time and shifting to a seated position and cueing of bellybutton towards spine, Pt able to feel subtle tightening. Pt responded well to active and educational interventions. Patient will continue to benefit from skilled therapeutic intervention to address deficits in IAP management, PFM coordination, PFM strength, posture, mobility, balance, and LE strength in order to increase PLOF and improve overall QOL.    Objective Impairments: Abnormal gait, decreased activity tolerance, decreased balance, decreased coordination, decreased endurance, decreased mobility, decreased strength, improper body mechanics, postural dysfunction, and pain.   Activity Limitations: lifting, bending, sitting, standing, squatting, sleeping, transfers, continence, toileting, and locomotion level  Personal Factors: Age, Behavior pattern, Past/current experiences, Time since onset of injury/illness/exacerbation, and 1-2 comorbidities: hx of breast cancer and  A-fib  are also affecting patient's functional outcome.   Rehab Potential: Fair - age related changes may inhibit progression towards long-term goals and compliance with HEP as improvements in PFM coordination/strength take time.   Clinical Decision Making: Evolving/moderate complexity  Evaluation Complexity: Moderate   GOALS: Goals reviewed with patient? Yes  SHORT TERM GOALS: Target date: 06/04/2022  Patient will decrease intake of caffeinated beverages and other bladder irritants to less than/= 2 cups and report increased water hydration in order to manage urinary leakage and bladder irritation for improved overall QOL and participation at home and in the community. Baseline: unsweetened Kool-Aid with stevia (up to 3 tall cups a day) Goal status: INITIAL    LONG TERM GOALS: Target date: 07/16/2022   Patient will improve score on FOTO Pelvic Floor outcome measure by at least 5-6 points in order to demonstrate improved IAP management, improved PFM coordination, and overall improved QOL.  Baseline: (7/27): 40 (target is 52)  Goal status: INITIAL  2.  Patient will report decreased reliance on protective undergarments as indicated by a 24 hour period to demonstrate improved bladder control and allow for increased participation in activities outside of the home. Baseline: incontinence briefs 2-3x/day Goal status: INITIAL  3.  Patient will report confidence in ability to control bladder > 7/10 in order to demonstrate improved function and ability to participate more fully in activities at home and in the community. Baseline: 4/10 Goal status: INITIAL  4.  Patient will participate in a 5TSTS test and demonstrate improved scores to match age-related norms (14 secs or less) to decrease risk in falls and overall balance as Pt has increased urinary leakage and does not use AD in the home.  Baseline: (7/20): 19 seconds  Goal status: INITIAL  5.  Patient will report less than 5 incidents of  stress urinary incontinence over the course of 3 weeks while coughing/sneezing/laughing/prolonged activity in order to demonstrate improved PFM coordination, strength, and function for improved overall QOL. Baseline: every time with all the above  Goal status: INITIAL    PLAN: PT Frequency: 1x/week  PT Duration: 12 weeks  Planned Interventions: Therapeutic exercises, Therapeutic activity, Neuromuscular re-education, Balance training, Gait training, Patient/Family education, Self Care, Joint mobilization, Spinal mobilization, Cryotherapy, Moist  heat, scar mobilization, Taping, and Manual therapy  Plan For Next Session: STSs with RW and breath/how is HEP?, posture?  [employer], PT, DPT  04/23/2022, 3:45 PM

## 2022-04-30 ENCOUNTER — Ambulatory Visit: Payer: Medicare Other | Attending: Urology

## 2022-04-30 DIAGNOSIS — M6289 Other specified disorders of muscle: Secondary | ICD-10-CM | POA: Insufficient documentation

## 2022-04-30 DIAGNOSIS — M6281 Muscle weakness (generalized): Secondary | ICD-10-CM | POA: Diagnosis present

## 2022-04-30 DIAGNOSIS — R278 Other lack of coordination: Secondary | ICD-10-CM | POA: Insufficient documentation

## 2022-04-30 DIAGNOSIS — R293 Abnormal posture: Secondary | ICD-10-CM | POA: Diagnosis present

## 2022-04-30 NOTE — Therapy (Signed)
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 01/2012 MV: EF 74%, no ischemia.   Hyperlipidemia    Osteoarthritis of both hips    And knees as well as back.    PAF (paroxysmal atrial fibrillation) (HCC)    a. 10/2017 Event monitor: 46 SVTs (longest 19 beats, avg 110 bpm). Afib (<1% burden w/ avg HR 142)-->Eliquis (CHA2DS2VASc = 4).   Valvular heart disease    a. 2013 Echo: Moderate MR and moderate-severe TR on echocardiogram; b. Echo 2015: mild MR & TR. Nl EF, mild LVH.   Vertigo    none in several years   Past Surgical History:  Procedure Laterality Date   BREAST EXCISIONAL BIOPSY Right 1971, 1981   negative   CATARACT EXTRACTION W/PHACO Right 03/28/2019   Procedure: CATARACT EXTRACTION PHACO AND INTRAOCULAR LENS PLACEMENT (IOC) RIGHT;  Surgeon: Galen Manila, MD;  Location: [facility];  Service: Ophthalmology;  Laterality: Right;   COLONOSCOPY     Holter Monitor  May 2013   @ [facility]iology: Occasional PVCs (benign)   NM MYOVIEW LTD  May 2013; Dec 2015   @ [facility] Cardiology: a) 2013: Exercise for 2:30 min (only), hypertensive response no ischemic changes. He has 74%, normal wall motion, no evidence of ischemia or infarction.; b) Ex 3:00 min (dyspnea) 3.3 METs HR 146 (106% MPHR) - no ECG changes, E 70%, no RWMA, No ischemia or infarction   ORIF ANKLE FRACTURE  Left 09/07/2016   Procedure: OPEN REDUCTION INTERNAL FIXATION (ORIF) ANKLE FRACTURE;  Surgeon: Donato Heinz, MD;  Location: [facility];  Service: Orthopedics;  Laterality: Left;   TONGUE BIOPSY     TONSILLECTOMY     TOTAL ABDOMINAL HYSTERECTOMY W/ BILATERAL SALPINGOOPHORECTOMY     TRANSTHORACIC ECHOCARDIOGRAM  May 2013; May 2015   @ [facility] Cardiology: a) 2013 : Normal LV function, EF 65%, mild BiAE, Mild LVH, Mod MR, Mod-Severe TR; b) normal LV size, EF> 55%, mild LVH, mild MR, mild TR and   Patient Active Problem List   Diagnosis Date Noted   Paroxysmal atrial fibrillation (HCC) 12/30/2017   Paroxysmal tachycardia (HCC) 11/19/2017   Palpitations 11/19/2017   Dizziness 11/19/2017   Ankle fracture, bimalleolar, closed, left, initial encounter 09/07/2016   Tachycardia palpitations 12/14/2014   Frequent PVCs 12/14/2014   Asymptomatic varicose veins of bilateral lower extremities: With mild edema 12/14/2014   Essential hypertension 12/14/2014   Mild mitral and tricuspid regurgitation on recent echo     PCP: [name], MD  REFERRING PROVIDER: Orson Ape, MD   REFERRING DIAG:  N39.41 (ICD-10-CM) - Urge incontinence  N39.3 (ICD-10-CM) - Stress incontinence (female) (female)   THERAPY DIAG:  Other lack of coordination  Muscle weakness (generalized)  Abnormal posture  Pelvic floor dysfunction  Rationale for Evaluation and Treatment: Rehabilitation  ONSET DATE: 2-3 years but worse about a month ago  PRECAUTIONS: None  WEIGHT BEARING RESTRICTIONS: No  FALLS:  Has patient fallen in last 6 months? No  OCCUPATION/SOCIAL ACTIVITIES: cooking, household chores, walking, would like to walk outdoors or in the mall more  PLOF: Independent with basic ADLs, Independent with household mobility without device, and Independent with community mobility with device   CHIEF CONCERN: Pt arrived with quad cane. Pt sprained R ankle twice when she was younger and ever since  has had problems with her R leg. Pt describes it as the R leg feels unstable at times. Pt only uses the quad cane when outside the home. Pt does have a seat in the shower and is IND with ADLs and mobility. Pt has noticed urinary leakage after sitting/lying down for prolonged periods and then goes to stand up. Pt had a cortisone shot in the R shoulder and feels her urinary leakage has been worse since then (a month ago). The Dr told the Pt to drink more water and she has been but it causes more leakage. Pt reports not feeling a sensation of needing to go to the bathroom, but she makes herself go because she assumes her bladder is full. Pt has been wearing pantyliners for many years and did not mind that but now has to wear briefs due to the amount of leakage. Pt also notices leakage with sneezing/coughing/prolonged activity/lifting.     PATIENT GOALS: Wants to be able to feel the urge to go to the bathroom, not leak as much or wear a brief (return to pantyliners)   UROLOGICAL HISTORY Fluid intake: Yes: water (up to 3 12 oz), unsweetened Kool-Aid with Stevia (2-3 tall cups/day)   Pain with urination: No Fully empty bladder: Yes but after she uses the bathroom, she has more leakage even though she just emptied  Stream: Weak, constant  Urgency: No  Toileting posture: feet flat Frequency: 6x "Just in case": yes Nocturia: 3x but feels little urge, she makes herself go Leakage: Urge to void, Walking to the bathroom, Coughing, Sneezing, Lifting, and Bending forward Pads: Yes Type: briefs Amount: 2-3x/day Bladder control (0-10): 4/10  GASTROINTESTINAL HISTORY Pain with bowel movement: No Type of bowel movement:Type (Bristol Stool Scale) 1 and 4 and Strain Yes Frequency: 1x/day Fully empty rectum: Yes   SEXUAL HISTORY/FUNCTION Pt has no concerns    OBSTETRICAL HISTORY- G0P0    GYNECOLOGICAL HISTORY Hysterectomy: yes, abdominal hysterectomy  Pelvic Organ Prolapse: None Pain with exam:   Heaviness/pressure:  SUBJECTIVE:  Pt was practicing HEP in supine and noticed increased heart palpitations. Pt does have a-fib and she usually feels like she is going to faint when her a-fib spells occur. However, since she was lying down she did not feel faint. Pt has a blood pressure machine but can't seem to take it herself and her nephew doesn't know how to take BP. Pt stopped exercise once she felt increased heart palpitations. After more discussion, Pt reports she was exhaling as long as she could with the TrA activation. Will discontinue TrA activation in supine.   PAIN:  Are you having pain? No    OBJECTIVE:    COGNITION: Overall cognitive status: Within functional limits for tasks assessed     POSTURE: (04/23/22)  Lumbar lordosis: decreased in standing  Thoracic kyphosis: significant in standing and sitting  Iliac crest - WNL Pelvic  obliquity - R slightly posteriorly rotated  Increased R shoulder elevation    GAIT:  Distance walked: 100 ft Assistive device utilized: quad cane Level of assistance: CGA  Comments:  Pt feels at times her R knee wants to give out with the quad cane. Pt also felt urinary leakage while walking.   SENSATION: Deferred 2/2 time constraints  Light touch: , L2-S2 dermatomes  Proprioception:    RANGE OF MOTION:    (Norm range in degrees)  LEFT 04/16/22 RIGHT 04/16/22  Lumbar forward flexion (65):  WNL    Lumbar extension (30): Restricted    Lumbar lateral flexion (25):  WNL WNL  Thoracic and Lumbar rotation (30 degrees):    WNL WNL  Hip Flexion (0-125):   WNL WNL  Hip IR (0-45):  WNL WNL  Hip ER (0-45):  WNL WNL  Hip Adduction:      Hip Abduction (0-40):  WNL WNL  Hip extension (0-15):     (*= pain, Blank rows = not tested)   STRENGTH: MMT    RLE 04/16/22 LLE 04/16/22  Hip Flexion 4 (backward lean) 4 (backward lean)  Hip Extension 4 4  Hip Abduction     Hip Adduction     Hip ER  5 5  Hip IR  5 5  Knee Extension 5 5  Knee  Flexion 4 4  Dorsiflexion     Plantarflexion (seated) 5 5  (*= pain, Blank rows = not tested)   SPECIAL TESTS:  FABER (SN 81): negative B FADIR (SN 94): negative B   PHYSICAL PERFORMANCE MEASURES:   5TSTS: 19 seconds    PALPATION: Abdominal:  Diastasis:  none Rib flare: present B  EXTERNAL PELVIC EXAM: Patient educated on the purpose of the pelvic exam and articulated understanding; patient consented to the exam verbally.  Breath coordination: inconsistent Voluntary Contraction: present, 2/5 MMT with adductor and abdominal activation  Relaxation: delayed Perineal movement with sustained IAP increase ("bear down"): descent, with significant cueing for technique, Valsalva noted Perineal movement with rapid IAP increase ("cough"): no change  (0= no contraction, 1= flicker, 2= weak squeeze, 3= fair squeeze with lift, 4= good squeeze and lift against resistance, 5= strong squeeze against strong resistance)    TODAY'S TREATMENT:  Therapeutic Activity: BP taken in seated after 5 mins of rest from gait with RW (waiting room to treatment room)  125/86 mmHg, 63 BPM  Time taken to address placement of BP cuff as Pt has digital cuff. Pt verbalized understanding.   Neuromuscular Re-education Seated hooklying diaphragmatic breathing with VCs and TCs for downregulation of the nervous system and improved management of IAP  Seated TrA activation with coordinated exhale for improved IAP management, VCs and Tcs required   Seated scapular retractions with breath for improved postural stability and endurance Required cueing to decrease bodily compensation of B shoulder elevation  Seated chin tucks for improved postural stability and endurance    Patient response to interventions: With increased repetition and cueing, Pt able to feel subtle TrA activation in seated.    Patient Education:  Patient provided with HEP: seated TrA contraction, seated scapular retractions and chin tucks.  Patient educated throughout session on appropriate technique and form using multi-modal cueing, HEP, and activity modification. Patient will benefit from further education in order to maximize compliance and understanding for long-term therapeutic gains.     ASSESSMENT:  Clinical Impression: Patient presents to clinic with excellent motivation to participate in today's session. Pt continues to demonstrate deficits in  IAP management, PFM coordination, PFM strength,  posture, mobility, balance, and LE strength. Pt reports that on Monday she had increased heart palpitations and SOB when performing supine HEP (TrA activation). Pt stopped exercise and symptoms subsided. Pt did not take BP at home and has medical hx of a-fib. TrA activation in supine discontinued and discussion on when to seek further medical attention. BP taken in seated: 125/86 mmHg, 63 BPM with no current sxs. Pt required significant VCs and TCs for proper technique with seated pursed-lip breathing and TrA activation. After increased time and repetition, Pt able to feel subtle TrA activation in seated. Pt responded well to all other active and educational interventions. Patient will continue to benefit from skilled therapeutic intervention to address deficits in IAP management, PFM coordination, PFM strength, posture, mobility, balance, and LE strength in order to increase PLOF and improve overall QOL.    Objective Impairments: Abnormal gait, decreased activity tolerance, decreased balance, decreased coordination, decreased endurance, decreased mobility, decreased strength, improper body mechanics, postural dysfunction, and pain.   Activity Limitations: lifting, bending, sitting, standing, squatting, sleeping, transfers, continence, toileting, and locomotion level  Personal Factors: Age, Behavior pattern, Past/current experiences, Time since onset of injury/illness/exacerbation, and 1-2 comorbidities: hx of breast cancer and A-fib  are  also affecting patient's functional outcome.   Rehab Potential: Fair - age related changes may inhibit progression towards long-term goals and compliance with HEP as improvements in PFM coordination/strength take time.   Clinical Decision Making: Evolving/moderate complexity  Evaluation Complexity: Moderate   GOALS: Goals reviewed with patient? Yes  SHORT TERM GOALS: Target date: 06/11/2022  Patient will decrease intake of caffeinated beverages and other bladder irritants to less than/= 2 cups and report increased water hydration in order to manage urinary leakage and bladder irritation for improved overall QOL and participation at home and in the community. Baseline: unsweetened Kool-Aid with stevia (up to 3 tall cups a day) Goal status: INITIAL    LONG TERM GOALS: Target date: 07/23/2022   Patient will improve score on FOTO Pelvic Floor outcome measure by at least 5-6 points in order to demonstrate improved IAP management, improved PFM coordination, and overall improved QOL.  Baseline: (7/27): 40 (target is 52)  Goal status: INITIAL  2.  Patient will report decreased reliance on protective undergarments as indicated by a 24 hour period to demonstrate improved bladder control and allow for increased participation in activities outside of the home. Baseline: incontinence briefs 2-3x/day Goal status: INITIAL  3.  Patient will report confidence in ability to control bladder > 7/10 in order to demonstrate improved function and ability to participate more fully in activities at home and in the community. Baseline: 4/10 Goal status: INITIAL  4.  Patient will participate in a 5TSTS test and demonstrate improved scores to match age-related norms (14 secs or less) to decrease risk in falls and overall balance as Pt has increased urinary leakage and does not use AD in the home.  Baseline: (7/20): 19 seconds  Goal status: INITIAL  5.  Patient will report less than 5 incidents of stress  urinary incontinence over the course of 3 weeks while coughing/sneezing/laughing/prolonged activity in order to demonstrate improved PFM coordination, strength, and function for improved overall QOL. Baseline: every time with all the above  Goal status: INITIAL    PLAN: PT Frequency: 1x/week  PT Duration: 12 weeks  Planned Interventions: Therapeutic exercises, Therapeutic activity, Neuromuscular re-education, Balance training, Gait training, Patient/Family education, Self Care, Joint mobilization, Spinal mobilization, Cryotherapy,  Moist heat, scar mobilization, Taping, and Manual therapy  Plan For Next Session: STSs, how did breath/deep core go in seated? Posture?   Jodeci Rini, PT, DPT  04/30/2022, 1:12 PM

## 2022-05-06 ENCOUNTER — Ambulatory Visit: Payer: Medicare Other

## 2022-05-07 ENCOUNTER — Ambulatory Visit: Payer: Medicare Other

## 2022-05-14 ENCOUNTER — Ambulatory Visit: Payer: Medicare Other

## 2022-05-14 DIAGNOSIS — M6281 Muscle weakness (generalized): Secondary | ICD-10-CM

## 2022-05-14 DIAGNOSIS — R278 Other lack of coordination: Secondary | ICD-10-CM | POA: Diagnosis not present

## 2022-05-14 DIAGNOSIS — M6289 Other specified disorders of muscle: Secondary | ICD-10-CM

## 2022-05-14 DIAGNOSIS — R293 Abnormal posture: Secondary | ICD-10-CM

## 2022-05-14 NOTE — Therapy (Signed)
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 01/2012 MV: EF 74%, no ischemia.   Hyperlipidemia    Osteoarthritis of both hips    And knees as well as back.    PAF (paroxysmal atrial fibrillation) (HCC)    a. 10/2017 Event monitor: 46 SVTs (longest 19 beats, avg 110 bpm). Afib (<1% burden w/ avg HR 142)-->Eliquis (CHA2DS2VASc = 4).   Valvular heart disease    a. 2013 Echo: Moderate MR and moderate-severe TR on echocardiogram; b. Echo 2015: mild MR & TR. Nl EF, mild LVH.   Vertigo    none in several years   Past Surgical History:  Procedure Laterality Date   BREAST EXCISIONAL BIOPSY Right 1971, 1981   negative   CATARACT EXTRACTION W/PHACO Right 03/28/2019   Procedure: CATARACT EXTRACTION PHACO AND INTRAOCULAR LENS PLACEMENT (IOC) RIGHT;  Surgeon: Galen Manila, MD;  Location: [facility];  Service: Ophthalmology;  Laterality: Right;   COLONOSCOPY     Holter Monitor  May 2013   @ [facility]: Occasional PVCs (benign)   NM MYOVIEW LTD  May 2013; Dec 2015   @ [facility]: a) 2013: Exercise for 2:30 min (only), hypertensive response no ischemic changes. He has 74%, normal wall motion, no evidence of ischemia or infarction.; b) Ex 3:00 min (dyspnea) 3.3 METs HR 146 (106% MPHR) - no ECG changes, E 70%, no RWMA, No ischemia  or infarction   ORIF ANKLE FRACTURE Left 09/07/2016   Procedure: OPEN REDUCTION INTERNAL FIXATION (ORIF) ANKLE FRACTURE;  Surgeon: Donato Heinz, MD;  Location: [facility];  Service: Orthopedics;  Laterality: Left;   TONGUE BIOPSY     TONSILLECTOMY     TOTAL ABDOMINAL HYSTERECTOMY W/ BILATERAL SALPINGOOPHORECTOMY     TRANSTHORACIC ECHOCARDIOGRAM  May 2013; May 2015   @ [facility]: a) 2013 : Normal LV function, EF 65%, mild BiAE, Mild LVH, Mod MR, Mod-Severe TR; b) normal LV size, EF> 55%, mild LVH, mild MR, mild TR and   Patient Active Problem List   Diagnosis Date Noted   Paroxysmal atrial fibrillation (HCC) 12/30/2017   Paroxysmal tachycardia (HCC) 11/19/2017   Palpitations 11/19/2017   Dizziness 11/19/2017   Ankle fracture, bimalleolar, closed, left, initial encounter 09/07/2016   Tachycardia palpitations 12/14/2014   Frequent PVCs 12/14/2014   Asymptomatic varicose veins of bilateral lower extremities: With mild edema 12/14/2014   Essential hypertension 12/14/2014   Mild mitral and tricuspid regurgitation on recent echo     PCP: [name], MD  REFERRING PROVIDER: Orson Ape, MD   REFERRING DIAG:  N39.41 (ICD-10-CM) - Urge incontinence  N39.3 (ICD-10-CM) - Stress incontinence (female) (female)   THERAPY  DIAG:  Other lack of coordination  Muscle weakness (generalized)  Abnormal posture  Pelvic floor dysfunction  Rationale for Evaluation and Treatment: Rehabilitation  ONSET DATE: 2-3 years but worse about a month ago  PRECAUTIONS: None  WEIGHT BEARING RESTRICTIONS: No  FALLS:  Has patient fallen in last 6 months? No  OCCUPATION/SOCIAL ACTIVITIES: cooking, household chores, walking, would like to walk outdoors or in the mall more  PLOF: Independent with basic ADLs, Independent with household mobility without device, and Independent with community mobility with device   CHIEF CONCERN: Pt arrived with quad cane. Pt sprained R ankle twice  when she was younger and ever since has had problems with her R leg. Pt describes it as the R leg feels unstable at times. Pt only uses the quad cane when outside the home. Pt does have a seat in the shower and is IND with ADLs and mobility. Pt has noticed urinary leakage after sitting/lying down for prolonged periods and then goes to stand up. Pt had a cortisone shot in the R shoulder and feels her urinary leakage has been worse since then (a month ago). The Dr told the Pt to drink more water and she has been but it causes more leakage. Pt reports not feeling a sensation of needing to go to the bathroom, but she makes herself go because she assumes her bladder is full. Pt has been wearing pantyliners for many years and did not mind that but now has to wear briefs due to the amount of leakage. Pt also notices leakage with sneezing/coughing/prolonged activity/lifting.     PATIENT GOALS: Wants to be able to feel the urge to go to the bathroom, not leak as much or wear a brief (return to pantyliners)   UROLOGICAL HISTORY Fluid intake: Yes: water (up to 3 12 oz), unsweetened Kool-Aid with Stevia (2-3 tall cups/day)   Pain with urination: No Fully empty bladder: Yes but after she uses the bathroom, she has more leakage even though she just emptied  Stream: Weak, constant  Urgency: No  Toileting posture: feet flat Frequency: 6x "Just in case": yes Nocturia: 3x but feels little urge, she makes herself go Leakage: Urge to void, Walking to the bathroom, Coughing, Sneezing, Lifting, and Bending forward Pads: Yes Type: briefs Amount: 2-3x/day Bladder control (0-10): 4/10  GASTROINTESTINAL HISTORY Pain with bowel movement: No Type of bowel movement:Type (Bristol Stool Scale) 1 and 4 and Strain Yes Frequency: 1x/day Fully empty rectum: Yes   SEXUAL HISTORY/FUNCTION Pt has no concerns    OBSTETRICAL HISTORY- G0P0    GYNECOLOGICAL HISTORY Hysterectomy: yes, abdominal hysterectomy  Pelvic Organ  Prolapse: None Pain with exam:  Heaviness/pressure:  SUBJECTIVE:  Pt has been practicing HEP (seated chin tucks). Will review. Pt has been having headaches recently that is in the back of the head and occasionally radiates to the ear. Pt has not taken medication for headache.    PAIN:  Are you having pain? Yes NPRS scale: 6/10    OBJECTIVE:    COGNITION: Overall cognitive status: Within functional limits for tasks assessed     POSTURE: (04/23/22)  Lumbar lordosis: decreased in standing  Thoracic kyphosis: significant in standing and sitting  Iliac crest - WNL Pelvic obliquity - R slightly posteriorly rotated  Increased R shoulder elevation    GAIT:  Distance walked: 100 ft Assistive device utilized: quad cane Level of assistance: CGA  Comments:  Pt feels at times her R knee wants to give out with the quad cane.  Pt also felt urinary leakage while walking.   SENSATION: Deferred 2/2 time constraints  Light touch: , L2-S2 dermatomes  Proprioception:    RANGE OF MOTION:    (Norm range in degrees)  LEFT 04/16/22 RIGHT 04/16/22  Lumbar forward flexion (65):  WNL    Lumbar extension (30): Restricted    Lumbar lateral flexion (25):  WNL WNL  Thoracic and Lumbar rotation (30 degrees):    WNL WNL  Hip Flexion (0-125):   WNL WNL  Hip IR (0-45):  WNL WNL  Hip ER (0-45):  WNL WNL  Hip Adduction:      Hip Abduction (0-40):  WNL WNL  Hip extension (0-15):     (*= pain, Blank rows = not tested)   STRENGTH: MMT    RLE 04/16/22 LLE 04/16/22  Hip Flexion 4 (backward lean) 4 (backward lean)  Hip Extension 4 4  Hip Abduction     Hip Adduction     Hip ER  5 5  Hip IR  5 5  Knee Extension 5 5  Knee Flexion 4 4  Dorsiflexion     Plantarflexion (seated) 5 5  (*= pain, Blank rows = not tested)   SPECIAL TESTS:  FABER (SN 81): negative B FADIR (SN 94): negative B   PHYSICAL PERFORMANCE MEASURES:   5TSTS: 19 seconds    PALPATION: Abdominal:  Diastasis:  none Rib  flare: present B  EXTERNAL PELVIC EXAM: Patient educated on the purpose of the pelvic exam and articulated understanding; patient consented to the exam verbally.  Breath coordination: inconsistent Voluntary Contraction: present, 2/5 MMT with adductor and abdominal activation  Relaxation: delayed Perineal movement with sustained IAP increase ("bear down"): descent, with significant cueing for technique, Valsalva noted Perineal movement with rapid IAP increase ("cough"): no change  (0= no contraction, 1= flicker, 2= weak squeeze, 3= fair squeeze with lift, 4= good squeeze and lift against resistance, 5= strong squeeze against strong resistance)    TODAY'S TREATMENT:   Neuromuscular Re-education Seated hooklying diaphragmatic breathing with VCs and TCs for downregulation of the nervous system and improved management of IAP  Seated TrA activation with coordinated exhale for improved IAP management, VCs and Tcs required  Practiced with heat modality on the upper neck, x8 mins during active interventions  STSs with coordinated breath for improved IAP management, VCs required to decrease bodily compensations  Seated upper trapezius for pain modulation and tissue extensibility, x15 secs hold B  Seated levator scap for pain modulation and tissue extensibility, x15 secs hold B    Patient response to interventions: After some pain modulation techniques, 5/10. Pt felt leakage with STSs.   Patient Education:  Patient provided with HEP: review of seated TrA contraction, seated upper trap/levator scap, STSs with breath. Patient educated throughout session on appropriate technique and form using multi-modal cueing, HEP, and activity modification. Patient will benefit from further education in order to maximize compliance and understanding for long-term therapeutic gains.     ASSESSMENT:  Clinical Impression: Patient presents to clinic with excellent motivation to participate in today's  session. Pt continues to demonstrate deficits in IAP management, PFM coordination, PFM strength,  posture, mobility, balance, and LE strength. Pt with increased neck/head pain 6/10 for a couple days but it not constant. Pt notices pain more when turning L or R. Pt reports no dizziness or instability of the neck. After heat modality and pain modulation techniques, Pt reports neck pain has improved (5/10) but continues to have HA. DPT advised heat  modality at home for 10-15 minutes at a time. Pt verbalized understanding. Pt required moderate Vcs and Tcs with review of seated TrA activation. Pt did require significant cueing for STSs with breath to decrease bodily compensations (elevated shoulders) and proper mechanics for efficiency (feet underneath and forward lean). Pt responded well to all active and educational interventions. Patient will continue to benefit from skilled therapeutic intervention to address deficits in IAP management, PFM coordination, PFM strength, posture, mobility, balance, and LE strength in order to increase PLOF and improve overall QOL.    Objective Impairments: Abnormal gait, decreased activity tolerance, decreased balance, decreased coordination, decreased endurance, decreased mobility, decreased strength, improper body mechanics, postural dysfunction, and pain.   Activity Limitations: lifting, bending, sitting, standing, squatting, sleeping, transfers, continence, toileting, and locomotion level  Personal Factors: Age, Behavior pattern, Past/current experiences, Time since onset of injury/illness/exacerbation, and 1-2 comorbidities: hx of breast cancer and A-fib  are also affecting patient's functional outcome.   Rehab Potential: Fair - age related changes may inhibit progression towards long-term goals and compliance with HEP as improvements in PFM coordination/strength take time.   Clinical Decision Making: Evolving/moderate complexity  Evaluation Complexity:  Moderate   GOALS: Goals reviewed with patient? Yes  SHORT TERM GOALS: Target date: 06/25/2022  Patient will decrease intake of caffeinated beverages and other bladder irritants to less than/= 2 cups and report increased water hydration in order to manage urinary leakage and bladder irritation for improved overall QOL and participation at home and in the community. Baseline: unsweetened Kool-Aid with stevia (up to 3 tall cups a day) Goal status: INITIAL    LONG TERM GOALS: Target date: 08/06/2022   Patient will improve score on FOTO Pelvic Floor outcome measure by at least 5-6 points in order to demonstrate improved IAP management, improved PFM coordination, and overall improved QOL.  Baseline: (7/27): 40 (target is 52)  Goal status: INITIAL  2.  Patient will report decreased reliance on protective undergarments as indicated by a 24 hour period to demonstrate improved bladder control and allow for increased participation in activities outside of the home. Baseline: incontinence briefs 2-3x/day Goal status: INITIAL  3.  Patient will report confidence in ability to control bladder > 7/10 in order to demonstrate improved function and ability to participate more fully in activities at home and in the community. Baseline: 4/10 Goal status: INITIAL  4.  Patient will participate in a 5TSTS test and demonstrate improved scores to match age-related norms (14 secs or less) to decrease risk in falls and overall balance as Pt has increased urinary leakage and does not use AD in the home.  Baseline: (7/20): 19 seconds  Goal status: INITIAL  5.  Patient will report less than 5 incidents of stress urinary incontinence over the course of 3 weeks while coughing/sneezing/laughing/prolonged activity in order to demonstrate improved PFM coordination, strength, and function for improved overall QOL. Baseline: every time with all the above  Goal status: INITIAL    PLAN: PT Frequency: 1x/week  PT  Duration: 12 weeks  Planned Interventions: Therapeutic exercises, Therapeutic activity, Neuromuscular re-education, Balance training, Gait training, Patient/Family education, Self Care, Joint mobilization, Spinal mobilization, Cryotherapy, Moist heat, scar mobilization, Taping, and Manual therapy  Plan For Next Session:  STSs, how did breath/deep core go in seated? Sidelying thoracic rotation or extension  Somara Frymire, PT, DPT  05/14/2022, 11:03 AM

## 2022-05-21 ENCOUNTER — Ambulatory Visit: Payer: Medicare Other

## 2022-05-21 DIAGNOSIS — M6281 Muscle weakness (generalized): Secondary | ICD-10-CM

## 2022-05-21 DIAGNOSIS — R293 Abnormal posture: Secondary | ICD-10-CM

## 2022-05-21 DIAGNOSIS — R278 Other lack of coordination: Secondary | ICD-10-CM

## 2022-05-21 DIAGNOSIS — M6289 Other specified disorders of muscle: Secondary | ICD-10-CM

## 2022-05-21 NOTE — Therapy (Signed)
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 01/2012 MV: EF 74%, no ischemia.   Hyperlipidemia    Osteoarthritis of both hips    And knees as well as back.    PAF (paroxysmal atrial fibrillation) (HCC)    a. 10/2017 Event monitor: 46 SVTs (longest 19 beats, avg 110 bpm). Afib (<1% burden w/ avg HR 142)-->Eliquis (CHA2DS2VASc = 4).   Valvular heart disease    a. 2013 Echo: Moderate MR and moderate-severe TR on echocardiogram; b. Echo 2015: mild MR & TR. Nl EF, mild LVH.   Vertigo    none in several years   Past Surgical History:  Procedure Laterality Date   BREAST EXCISIONAL BIOPSY Right 1971, 1981   negative   CATARACT EXTRACTION W/PHACO Right 03/28/2019   Procedure: CATARACT EXTRACTION PHACO AND INTRAOCULAR LENS PLACEMENT (IOC) RIGHT;  Surgeon: Galen Manila, MD;  Location: [facility];  Service: Ophthalmology;  Laterality: Right;   COLONOSCOPY     Holter Monitor  May 2013   @ [facility]: Occasional PVCs (benign)   NM MYOVIEW LTD  May 2013; Dec 2015   @ [facility]: a) 2013: Exercise for 2:30 min (only), hypertensive response no ischemic changes. He has 74%, normal wall motion, no evidence of ischemia or infarction.; b) Ex 3:00 min (dyspnea) 3.3 METs HR 146 (106% MPHR) - no ECG changes, E 70%, no RWMA, No ischemia  or infarction   ORIF ANKLE FRACTURE Left 09/07/2016   Procedure: OPEN REDUCTION INTERNAL FIXATION (ORIF) ANKLE FRACTURE;  Surgeon: Donato Heinz, MD;  Location: [facility];  Service: Orthopedics;  Laterality: Left;   TONGUE BIOPSY     TONSILLECTOMY     TOTAL ABDOMINAL HYSTERECTOMY W/ BILATERAL SALPINGOOPHORECTOMY     TRANSTHORACIC ECHOCARDIOGRAM  May 2013; May 2015   @ [facility]: a) 2013 : Normal LV function, EF 65%, mild BiAE, Mild LVH, Mod MR, Mod-Severe TR; b) normal LV size, EF> 55%, mild LVH, mild MR, mild TR and   Patient Active Problem List   Diagnosis Date Noted   Paroxysmal atrial fibrillation (HCC) 12/30/2017   Paroxysmal tachycardia (HCC) 11/19/2017   Palpitations 11/19/2017   Dizziness 11/19/2017   Ankle fracture, bimalleolar, closed, left, initial encounter 09/07/2016   Tachycardia palpitations 12/14/2014   Frequent PVCs 12/14/2014   Asymptomatic varicose veins of bilateral lower extremities: With mild edema 12/14/2014   Essential hypertension 12/14/2014   Mild mitral and tricuspid regurgitation on recent echo     PCP: [name], MD  REFERRING PROVIDER: Orson Ape, MD   REFERRING DIAG:  N39.41 (ICD-10-CM) - Urge incontinence  N39.3 (ICD-10-CM) - Stress incontinence (female) (female)   THERAPY  DIAG:  Other lack of coordination  Muscle weakness (generalized)  Abnormal posture  Pelvic floor dysfunction  Rationale for Evaluation and Treatment: Rehabilitation  ONSET DATE: 2-3 years but worse about a month ago  PRECAUTIONS: None  WEIGHT BEARING RESTRICTIONS: No  FALLS:  Has patient fallen in last 6 months? No  OCCUPATION/SOCIAL ACTIVITIES: cooking, household chores, walking, would like to walk outdoors or in the mall more  PLOF: Independent with basic ADLs, Independent with household mobility without device, and Independent with community mobility with device   CHIEF CONCERN: Pt arrived with quad cane. Pt sprained R ankle twice  when she was younger and ever since has had problems with her R leg. Pt describes it as the R leg feels unstable at times. Pt only uses the quad cane when outside the home. Pt does have a seat in the shower and is IND with ADLs and mobility. Pt has noticed urinary leakage after sitting/lying down for prolonged periods and then goes to stand up. Pt had a cortisone shot in the R shoulder and feels her urinary leakage has been worse since then (a month ago). The Dr told the Pt to drink more water and she has been but it causes more leakage. Pt reports not feeling a sensation of needing to go to the bathroom, but she makes herself go because she assumes her bladder is full. Pt has been wearing pantyliners for many years and did not mind that but now has to wear briefs due to the amount of leakage. Pt also notices leakage with sneezing/coughing/prolonged activity/lifting.     PATIENT GOALS: Wants to be able to feel the urge to go to the bathroom, not leak as much or wear a brief (return to pantyliners)   UROLOGICAL HISTORY Fluid intake: Yes: water (up to 3 12 oz), unsweetened Kool-Aid with Stevia (2-3 tall cups/day)   Pain with urination: No Fully empty bladder: Yes but after she uses the bathroom, she has more leakage even though she just emptied  Stream: Weak, constant  Urgency: No  Toileting posture: feet flat Frequency: 6x "Just in case": yes Nocturia: 3x but feels little urge, she makes herself go Leakage: Urge to void, Walking to the bathroom, Coughing, Sneezing, Lifting, and Bending forward Pads: Yes Type: briefs Amount: 2-3x/day Bladder control (0-10): 4/10  GASTROINTESTINAL HISTORY Pain with bowel movement: No Type of bowel movement:Type (Bristol Stool Scale) 1 and 4 and Strain Yes Frequency: 1x/day Fully empty rectum: Yes   SEXUAL HISTORY/FUNCTION Pt has no concerns    OBSTETRICAL HISTORY- G0P0    GYNECOLOGICAL HISTORY Hysterectomy: yes, abdominal hysterectomy  Pelvic Organ  Prolapse: None Pain with exam:  Heaviness/pressure:  SUBJECTIVE:  Pt has some questions about HEP. Pt feels that her leakage has been better. Pt is able to feel when she needs to go and does not leak on the way to the bathroom. Pt also has noticed no leakage during the night.    PAIN:  Are you having pain? No    OBJECTIVE:    COGNITION: Overall cognitive status: Within functional limits for tasks assessed     POSTURE: (04/23/22)  Lumbar lordosis: decreased in standing  Thoracic kyphosis: significant in standing and sitting  Iliac crest - WNL Pelvic obliquity - R slightly posteriorly rotated  Increased R shoulder elevation    GAIT:  Distance walked: 100 ft Assistive device utilized: quad cane Level of assistance: CGA  Comments:  Pt feels at times her R knee wants to give out with  the quad cane. Pt also felt urinary leakage while walking.   SENSATION: Deferred 2/2 time constraints  Light touch: , L2-S2 dermatomes  Proprioception:    RANGE OF MOTION:    (Norm range in degrees)  LEFT 04/16/22 RIGHT 04/16/22  Lumbar forward flexion (65):  WNL    Lumbar extension (30): Restricted    Lumbar lateral flexion (25):  WNL WNL  Thoracic and Lumbar rotation (30 degrees):    WNL WNL  Hip Flexion (0-125):   WNL WNL  Hip IR (0-45):  WNL WNL  Hip ER (0-45):  WNL WNL  Hip Adduction:      Hip Abduction (0-40):  WNL WNL  Hip extension (0-15):     (*= pain, Blank rows = not tested)   STRENGTH: MMT    RLE 04/16/22 LLE 04/16/22  Hip Flexion 4 (backward lean) 4 (backward lean)  Hip Extension 4 4  Hip Abduction     Hip Adduction     Hip ER  5 5  Hip IR  5 5  Knee Extension 5 5  Knee Flexion 4 4  Dorsiflexion     Plantarflexion (seated) 5 5  (*= pain, Blank rows = not tested)   SPECIAL TESTS:  FABER (SN 81): negative B FADIR (SN 94): negative B   PHYSICAL PERFORMANCE MEASURES:   5TSTS: 19 seconds    PALPATION: Abdominal:  Diastasis:  none Rib flare: present  B  EXTERNAL PELVIC EXAM: Patient educated on the purpose of the pelvic exam and articulated understanding; patient consented to the exam verbally.  Breath coordination: inconsistent Voluntary Contraction: present, 2/5 MMT with adductor and abdominal activation  Relaxation: delayed Perineal movement with sustained IAP increase ("bear down"): descent, with significant cueing for technique, Valsalva noted Perineal movement with rapid IAP increase ("cough"): no change  (0= no contraction, 1= flicker, 2= weak squeeze, 3= fair squeeze with lift, 4= good squeeze and lift against resistance, 5= strong squeeze against strong resistance)    TODAY'S TREATMENT:   Neuromuscular Re-education Seated diaphragmatic breathing for improved IAP management  Seated TrA activation with coordinated exhale for improved IAP management, VCs and TCs required  LE challenge: seated march   STSs at counter with coordinated breath for improved IAP management, x10, VCs required to decrease bodily compensations Decrease upper shoulder elevation and hip adduction to stand  Review of  Seated upper trapezius for pain modulation and tissue extensibility, x15 secs hold B Seated levator scap for pain modulation and tissue extensibility, x15 secs hold B    Patient response to interventions: Pt did not leak with STSs   Patient Education:  Patient provided with HEP: review of seated TrA contraction with march, STSs with breath. Patient educated throughout session on appropriate technique and form using multi-modal cueing, HEP, and activity modification. Patient will benefit from further education in order to maximize compliance and understanding for long-term therapeutic gains.     ASSESSMENT:  Clinical Impression: Patient presents to clinic with excellent motivation to participate in today's session. Pt continues to demonstrate deficits in IAP management, PFM coordination, PFM strength,  posture, mobility, balance,  and LE strength. Pt reports a decrease in urinary leakage and urinary frequency at night. Pt asked to review upper neck stretches for pain modulation. Pt required moderate VCs and TCs with TrA activation progression in seated. Pt able to feel TrA activate with movement of the LE. Pt did require continued verbal cueing to decrease bodily compensations (increased shoulder elevation) during STSs and to control descent  with breath. Pt improved with increased time. Pt responded positively to all active and educational interventions. Patient will continue to benefit from skilled therapeutic intervention to address deficits in IAP management, PFM coordination, PFM strength, posture, mobility, balance, and LE strength in order to increase PLOF and improve overall QOL.    Objective Impairments: Abnormal gait, decreased activity tolerance, decreased balance, decreased coordination, decreased endurance, decreased mobility, decreased strength, improper body mechanics, postural dysfunction, and pain.   Activity Limitations: lifting, bending, sitting, standing, squatting, sleeping, transfers, continence, toileting, and locomotion level  Personal Factors: Age, Behavior pattern, Past/current experiences, Time since onset of injury/illness/exacerbation, and 1-2 comorbidities: hx of breast cancer and A-fib  are also affecting patient's functional outcome.   Rehab Potential: Fair - age related changes may inhibit progression towards long-term goals and compliance with HEP as improvements in PFM coordination/strength take time.   Clinical Decision Making: Evolving/moderate complexity  Evaluation Complexity: Moderate   GOALS: Goals reviewed with patient? Yes  SHORT TERM GOALS: Target date: 07/02/2022  Patient will decrease intake of caffeinated beverages and other bladder irritants to less than/= 2 cups and report increased water hydration in order to manage urinary leakage and bladder irritation for improved overall  QOL and participation at home and in the community. Baseline: unsweetened Kool-Aid with stevia (up to 3 tall cups a day) Goal status: INITIAL    LONG TERM GOALS: Target date: 08/13/2022   Patient will improve score on FOTO Pelvic Floor outcome measure by at least 5-6 points in order to demonstrate improved IAP management, improved PFM coordination, and overall improved QOL.  Baseline: (7/27): 40 (target is 52)  Goal status: INITIAL  2.  Patient will report decreased reliance on protective undergarments as indicated by a 24 hour period to demonstrate improved bladder control and allow for increased participation in activities outside of the home. Baseline: incontinence briefs 2-3x/day Goal status: INITIAL  3.  Patient will report confidence in ability to control bladder > 7/10 in order to demonstrate improved function and ability to participate more fully in activities at home and in the community. Baseline: 4/10 Goal status: INITIAL  4.  Patient will participate in a 5TSTS test and demonstrate improved scores to match age-related norms (14 secs or less) to decrease risk in falls and overall balance as Pt has increased urinary leakage and does not use AD in the home.  Baseline: (7/20): 19 seconds  Goal status: INITIAL  5.  Patient will report less than 5 incidents of stress urinary incontinence over the course of 3 weeks while coughing/sneezing/laughing/prolonged activity in order to demonstrate improved PFM coordination, strength, and function for improved overall QOL. Baseline: every time with all the above  Goal status: INITIAL    PLAN: PT Frequency: 1x/week  PT Duration: 12 weeks  Planned Interventions: Therapeutic exercises, Therapeutic activity, Neuromuscular re-education, Balance training, Gait training, Patient/Family education, Self Care, Joint mobilization, Spinal mobilization, Cryotherapy, Moist heat, scar mobilization, Taping, and Manual therapy  Plan For Next Session:   STSs, how did deep core go this week? STSs, Sidelying thoracic rotation or extension, FOTO?  Symphanie Cederberg, PT, DPT  05/21/2022, 11:09 AM

## 2022-05-28 ENCOUNTER — Ambulatory Visit: Payer: Medicare Other

## 2022-05-28 DIAGNOSIS — R278 Other lack of coordination: Secondary | ICD-10-CM

## 2022-05-28 DIAGNOSIS — M6281 Muscle weakness (generalized): Secondary | ICD-10-CM

## 2022-05-28 DIAGNOSIS — M6289 Other specified disorders of muscle: Secondary | ICD-10-CM

## 2022-05-28 DIAGNOSIS — R293 Abnormal posture: Secondary | ICD-10-CM

## 2022-05-28 NOTE — Therapy (Signed)
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 01/2012 MV: EF 74%, no ischemia.   Hyperlipidemia    Osteoarthritis of both hips    And knees as well as back.    PAF (paroxysmal atrial fibrillation) ([place])    a. 10/2017 Event monitor: 46 SVTs (longest 19 beats, avg 110 bpm). Afib (<1% burden w/ avg HR 142)-->Eliquis (CHA2DS2VASc = 4).   Valvular heart disease    a. 2013 Echo: Moderate MR and moderate-severe TR on echocardiogram; b. Echo 2015: mild MR & TR. Nl EF, mild LVH.   Vertigo    none in several years   Past Surgical History:  Procedure Laterality Date   BREAST EXCISIONAL BIOPSY Right 1971, 1981   negative   CATARACT EXTRACTION W/PHACO Right 03/28/2019   Procedure: CATARACT EXTRACTION PHACO AND INTRAOCULAR LENS PLACEMENT ([place]) RIGHT;  Surgeon: Birder Robson, MD;  Location: [place];  Service: Ophthalmology;  Laterality: Right;   COLONOSCOPY     Holter Monitor  May 2013   @ [facility] Cardiology: Occasional PVCs (benign)   NM MYOVIEW LTD  May 2013; Dec 2015   @ [facility] Cardiology: a) 2013: Exercise for 2:30 min (only), hypertensive response no ischemic changes. He has 74%, normal wall motion, no evidence of ischemia or infarction.; b) Ex 3:00 min (dyspnea) 3.3 METs HR 146 (106% MPHR) - no ECG changes, E 70%, no RWMA, No ischemia  or infarction   ORIF ANKLE FRACTURE Left 09/07/2016   Procedure: OPEN REDUCTION INTERNAL FIXATION (ORIF) ANKLE FRACTURE;  Surgeon: Dereck Leep, MD;  Location: [facility];  Service: Orthopedics;  Laterality: Left;   TONGUE BIOPSY     TONSILLECTOMY     TOTAL ABDOMINAL HYSTERECTOMY W/ BILATERAL SALPINGOOPHORECTOMY     TRANSTHORACIC ECHOCARDIOGRAM  May 2013; May 2015   @ [facility] Cardiology: a) 2013 : Normal LV function, EF 65%, mild BiAE, Mild LVH, Mod MR, Mod-Severe TR; b) normal LV size, EF> 55%, mild LVH, mild MR, mild TR and   Patient Active Problem List   Diagnosis Date Noted   Paroxysmal atrial fibrillation ([place]) 12/30/2017   Paroxysmal tachycardia ([place]) 11/19/2017   Palpitations 11/19/2017   Dizziness 11/19/2017   Ankle fracture, bimalleolar, closed, left, initial encounter 09/07/2016   Tachycardia palpitations 12/14/2014   Frequent PVCs 12/14/2014   Asymptomatic varicose veins of bilateral lower extremities: With mild edema 12/14/2014   Essential hypertension 12/14/2014   Mild mitral and tricuspid regurgitation on recent echo     PCP: Maryland Pink, MD  REFERRING PROVIDER: Royston Cowper, MD   REFERRING DIAG:  N39.41 (ICD-10-CM) - Urge incontinence  N39.3 (ICD-10-CM) - Stress incontinence (female) (female)   THERAPY  DIAG:  Other lack of coordination  Muscle weakness (generalized)  Abnormal posture  Pelvic floor dysfunction  Rationale for Evaluation and Treatment: Rehabilitation  ONSET DATE: 2-3 years but worse about a month ago  PRECAUTIONS: None  WEIGHT BEARING RESTRICTIONS: No  FALLS:  Has patient fallen in last 6 months? No  OCCUPATION/SOCIAL ACTIVITIES: cooking, household chores, walking, would like to walk outdoors or in the mall more  PLOF: Independent with basic ADLs, Independent with household mobility without device, and Independent with community mobility with device   CHIEF CONCERN: Pt arrived with quad cane. Pt sprained R ankle twice  when she was younger and ever since has had problems with her R leg. Pt describes it as the R leg feels unstable at times. Pt only uses the quad cane when outside the home. Pt does have a seat in the shower and is IND with ADLs and mobility. Pt has noticed urinary leakage after sitting/lying down for prolonged periods and then goes to stand up. Pt had a cortisone shot in the R shoulder and feels her urinary leakage has been worse since then (a month ago). The Dr told the Pt to drink more water and she has been but it causes more leakage. Pt reports not feeling a sensation of needing to go to the bathroom, but she makes herself go because she assumes her bladder is full. Pt has been wearing pantyliners for many years and did not mind that but now has to wear briefs due to the amount of leakage. Pt also notices leakage with sneezing/coughing/prolonged activity/lifting.     PATIENT GOALS: Wants to be able to feel the urge to go to the bathroom, not leak as much or wear a brief (return to pantyliners)   UROLOGICAL HISTORY Fluid intake: Yes: water (up to 3 12 oz), unsweetened Kool-Aid with Stevia (2-3 tall cups/day)   Pain with urination: No Fully empty bladder: Yes but after she uses the bathroom, she has more leakage even though she just emptied  Stream: Weak, constant  Urgency: No  Toileting posture: feet flat Frequency: 6x "Just in case": yes Nocturia: 3x but feels little urge, she makes herself go Leakage: Urge to void, Walking to the bathroom, Coughing, Sneezing, Lifting, and Bending forward Pads: Yes Type: briefs Amount: 2-3x/day Bladder control (0-10): 4/10  GASTROINTESTINAL HISTORY Pain with bowel movement: No Type of bowel movement:Type (Bristol Stool Scale) 1 and 4 and Strain Yes Frequency: 1x/day Fully empty rectum: Yes   SEXUAL HISTORY/FUNCTION Pt has no concerns    OBSTETRICAL HISTORY- G0P0    GYNECOLOGICAL HISTORY Hysterectomy: yes, abdominal hysterectomy  Pelvic Organ  Prolapse: None Pain with exam:  Heaviness/pressure:  SUBJECTIVE:  Pt has been practicing HEP (deep core and STSs). Pt has also noticed that with increased stress levels, she has increased urinary leakage. Pt continues to wear briefs in the community for comfort and to make sure she doesn't have an accident but has been able to use Moderate Lvl incontinence pads in the home.   PAIN:  Are you having pain? Yes NPRS scale: comes and goes in the posterior neck     OBJECTIVE:    COGNITION: Overall cognitive status: Within functional limits for tasks assessed     POSTURE: (04/23/22)  Lumbar lordosis: decreased in standing  Thoracic kyphosis: significant in standing and sitting  Iliac crest - WNL Pelvic obliquity - R slightly posteriorly rotated  Increased R shoulder elevation    GAIT:  Distance walked: 100 ft Assistive device utilized:   quad cane Level of assistance: CGA  Comments:  Pt feels at times her R knee wants to give out with the quad cane. Pt also felt urinary leakage while walking.   SENSATION: Deferred 2/2 time constraints  Light touch: , L2-S2 dermatomes  Proprioception:    RANGE OF MOTION:    (Norm range in degrees)  LEFT 04/16/22 RIGHT 04/16/22  Lumbar forward flexion (65):  WNL    Lumbar extension (30): Restricted    Lumbar lateral flexion (25):  WNL WNL  Thoracic and Lumbar rotation (30 degrees):    WNL WNL  Hip Flexion (0-125):   WNL WNL  Hip IR (0-45):  WNL WNL  Hip ER (0-45):  WNL WNL  Hip Adduction:      Hip Abduction (0-40):  WNL WNL  Hip extension (0-15):     (*= pain, Blank rows = not tested)   STRENGTH: MMT    RLE 04/16/22 LLE 04/16/22  Hip Flexion 4 (backward lean) 4 (backward lean)  Hip Extension 4 4  Hip Abduction     Hip Adduction     Hip ER  5 5  Hip IR  5 5  Knee Extension 5 5  Knee Flexion 4 4  Dorsiflexion     Plantarflexion (seated) 5 5  (*= pain, Blank rows = not tested)   SPECIAL TESTS:  FABER (SN 81): negative B FADIR  (SN 94): negative B   PHYSICAL PERFORMANCE MEASURES:   5TSTS: 19 seconds    PALPATION: Abdominal:  Diastasis:  none Rib flare: present B  EXTERNAL PELVIC EXAM: Patient educated on the purpose of the pelvic exam and articulated understanding; patient consented to the exam verbally.  Breath coordination: inconsistent Voluntary Contraction: present, 2/5 MMT with adductor and abdominal activation  Relaxation: delayed Perineal movement with sustained IAP increase ("bear down"): descent, with significant cueing for technique, Valsalva noted Perineal movement with rapid IAP increase ("cough"): no change  (0= no contraction, 1= flicker, 2= weak squeeze, 3= fair squeeze with lift, 4= good squeeze and lift against resistance, 5= strong squeeze against strong resistance)    TODAY'S TREATMENT  Manual Therapy: TTrP release at upper trap with neck rotation movement to aid in pain modulation and improved mobility    Neuromuscular Re-education Discussion on sympathetic nervous system/increased cortisol levels and how that can impact PFM coordination. Pt verbalized understanding.   Increasing postural awareness and stability Seated thoracic extension, cueing for breath and proper technique  Seated scapular retraction with cueing to bring shoulder blades down and back  STSs with use of RW and emphasis on shifting weight anterior (slight bend in knees, relaxing gluteal musculature) and head looking forward, mirror feedback provided    Patient response to interventions: Pt reports feeling less tension in the posterior neck at end of session    Patient Education:  Patient provided with HEP: seated scapular retraction/seated thoracic extension, seated shoulder rolls posteriorly. Patient educated throughout session on appropriate technique and form using multi-modal cueing, HEP, and activity modification. Patient will benefit from further education in order to maximize compliance and understanding  for long-term therapeutic gains.     ASSESSMENT:  Clinical Impression: Patient presents to clinic with excellent motivation to participate in today's session. Pt continues to demonstrate deficits in IAP management, PFM coordination, PFM strength,  posture, mobility, balance, and LE strength. Pt reports a decrease in urinary leakage compared to IE and has been able to use moderate level of incontinence pads in the home. Pt continues to have  intermittent posterior neck pain which DPT discussed increased tension felt upon palpation at the upper trap and Pt requiring constant cueing to decrease shoulder elevation throughout session. Pt benefited from mirror feedback in relation to standing posture and shifting COG more anteriorly with forward gaze. Pt responded positively to manual, active, and educational interventions. Patient will continue to benefit from skilled therapeutic intervention to address deficits in IAP management, PFM coordination, PFM strength, posture, mobility, balance, and LE strength in order to increase PLOF and improve overall QOL.    Objective Impairments: Abnormal gait, decreased activity tolerance, decreased balance, decreased coordination, decreased endurance, decreased mobility, decreased strength, improper body mechanics, postural dysfunction, and pain.   Activity Limitations: lifting, bending, sitting, standing, squatting, sleeping, transfers, continence, toileting, and locomotion level  Personal Factors: Age, Behavior pattern, Past/current experiences, Time since onset of injury/illness/exacerbation, and 1-2 comorbidities: hx of breast cancer and A-fib  are also affecting patient's functional outcome.   Rehab Potential: Fair - age related changes may inhibit progression towards long-term goals and compliance with HEP as improvements in PFM coordination/strength take time.   Clinical Decision Making: Evolving/moderate complexity  Evaluation Complexity:  Moderate   GOALS: Goals reviewed with patient? Yes  SHORT TERM GOALS: Target date: 07/09/2022  Patient will decrease intake of caffeinated beverages and other bladder irritants to less than/= 2 cups and report increased water hydration in order to manage urinary leakage and bladder irritation for improved overall QOL and participation at home and in the community. Baseline: unsweetened Kool-Aid with stevia (up to 3 tall cups a day) Goal status: INITIAL    LONG TERM GOALS: Target date: 08/20/2022   Patient will improve score on FOTO Pelvic Floor outcome measure by at least 5-6 points in order to demonstrate improved IAP management, improved PFM coordination, and overall improved QOL.  Baseline: (7/27): 40 (target is 52)  Goal status: INITIAL  2.  Patient will report decreased reliance on protective undergarments as indicated by a 24 hour period to demonstrate improved bladder control and allow for increased participation in activities outside of the home. Baseline: incontinence briefs 2-3x/day Goal status: INITIAL  3.  Patient will report confidence in ability to control bladder > 7/10 in order to demonstrate improved function and ability to participate more fully in activities at home and in the community. Baseline: 4/10 Goal status: INITIAL  4.  Patient will participate in a 5TSTS test and demonstrate improved scores to match age-related norms (14 secs or less) to decrease risk in falls and overall balance as Pt has increased urinary leakage and does not use AD in the home.  Baseline: (7/20): 19 seconds  Goal status: INITIAL  5.  Patient will report less than 5 incidents of stress urinary incontinence over the course of 3 weeks while coughing/sneezing/laughing/prolonged activity in order to demonstrate improved PFM coordination, strength, and function for improved overall QOL. Baseline: every time with all the above  Goal status: INITIAL    PLAN: PT Frequency: 1x/week  PT  Duration: 12 weeks  Planned Interventions: Therapeutic exercises, Therapeutic activity, Neuromuscular re-education, Balance training, Gait training, Patient/Family education, Self Care, Joint mobilization, Spinal mobilization, Cryotherapy, Moist heat, scar mobilization, Taping, and Manual therapy  Plan For Next Session:  FOTO, STSs, deep core in sitting (dead bug), how is neck?  Orval Dortch, PT, DPT  05/28/2022, 2:08 PM

## 2022-06-04 ENCOUNTER — Ambulatory Visit: Payer: Medicare Other | Attending: Urology

## 2022-06-04 DIAGNOSIS — M6281 Muscle weakness (generalized): Secondary | ICD-10-CM | POA: Insufficient documentation

## 2022-06-04 DIAGNOSIS — R293 Abnormal posture: Secondary | ICD-10-CM | POA: Diagnosis present

## 2022-06-04 DIAGNOSIS — R278 Other lack of coordination: Secondary | ICD-10-CM | POA: Insufficient documentation

## 2022-06-04 DIAGNOSIS — M6289 Other specified disorders of muscle: Secondary | ICD-10-CM | POA: Diagnosis present

## 2022-06-04 NOTE — Therapy (Signed)
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 01/2012 MV: EF 74%, no ischemia.   Hyperlipidemia    Osteoarthritis of both hips    And knees as well as back.    PAF (paroxysmal atrial fibrillation) (HCC)    a. 10/2017 Event monitor: 46 SVTs (longest 19 beats, avg 110 bpm). Afib (<1% burden w/ avg HR 142)-->Eliquis (CHA2DS2VASc = 4).   Valvular heart disease    a. 2013 Echo: Moderate MR and moderate-severe TR on echocardiogram; b. Echo 2015: mild MR & TR. Nl EF, mild LVH.   Vertigo    none in several years   Past Surgical History:  Procedure Laterality Date   BREAST EXCISIONAL BIOPSY Right 1971, 1981   negative   CATARACT EXTRACTION W/PHACO Right 03/28/2019   Procedure: CATARACT EXTRACTION PHACO AND INTRAOCULAR LENS PLACEMENT (IOC) RIGHT;  Surgeon: Porfilio, William, MD;  Location: [facility];  Service: Ophthalmology;  Laterality: Right;   COLONOSCOPY     Holter Monitor  May 2013   @ [facility]: Occasional PVCs (benign)   NM MYOVIEW LTD  May 2013; Dec 2015   @ [facility]: a) 2013: Exercise for 2:30 min (only), hypertensive response no ischemic changes. He has 74%, normal wall motion, no evidence of ischemia or infarction.; b) Ex 3:00 min (dyspnea) 3.3 METs HR 146 (106% MPHR) - no ECG changes, E 70%, no RWMA, No ischemia  or infarction   ORIF ANKLE FRACTURE Left 09/07/2016   Procedure: OPEN REDUCTION INTERNAL FIXATION (ORIF) ANKLE FRACTURE;  Surgeon: James P Hooten, MD;  Location: [facility];  Service: Orthopedics;  Laterality: Left;   TONGUE BIOPSY     TONSILLECTOMY     TOTAL ABDOMINAL HYSTERECTOMY W/ BILATERAL SALPINGOOPHORECTOMY     TRANSTHORACIC ECHOCARDIOGRAM  May 2013; May 2015   @ [facility]: a) 2013 : Normal LV function, EF 65%, mild BiAE, Mild LVH, Mod MR, Mod-Severe TR; b) normal LV size, EF> 55%, mild LVH, mild MR, mild TR and   Patient Active Problem List   Diagnosis Date Noted   Paroxysmal atrial fibrillation (HCC) 12/30/2017   Paroxysmal tachycardia (HCC) 11/19/2017   Palpitations 11/19/2017   Dizziness 11/19/2017   Ankle fracture, bimalleolar, closed, left, initial encounter 09/07/2016   Tachycardia palpitations 12/14/2014   Frequent PVCs 12/14/2014   Asymptomatic varicose veins of bilateral lower extremities: With mild edema 12/14/2014   Essential hypertension 12/14/2014   Mild mitral and tricuspid regurgitation on recent echo     PCP: James Hedrick, MD  REFERRING PROVIDER: Wolff, Michael R, MD   REFERRING DIAG:  N39.41 (ICD-10-CM) - Urge incontinence  N39.3 (ICD-10-CM) - Stress incontinence (female) (female)   THERAPY   DIAG:  Other lack of coordination  Muscle weakness (generalized)  Abnormal posture  Pelvic floor dysfunction  Rationale for Evaluation and Treatment: Rehabilitation  ONSET DATE: 2-3 years but worse about a month ago  PRECAUTIONS: None  WEIGHT BEARING RESTRICTIONS: No  FALLS:  Has patient fallen in last 6 months? No  OCCUPATION/SOCIAL ACTIVITIES: cooking, household chores, walking, would like to walk outdoors or in the mall more  PLOF: Independent with basic ADLs, Independent with household mobility without device, and Independent with community mobility with device   CHIEF CONCERN: Pt arrived with quad cane. Pt sprained R ankle twice  when she was younger and ever since has had problems with her R leg. Pt describes it as the R leg feels unstable at times. Pt only uses the quad cane when outside the home. Pt does have a seat in the shower and is IND with ADLs and mobility. Pt has noticed urinary leakage after sitting/lying down for prolonged periods and then goes to stand up. Pt had a cortisone shot in the R shoulder and feels her urinary leakage has been worse since then (a month ago). The Dr told the Pt to drink more water and she has been but it causes more leakage. Pt reports not feeling a sensation of needing to go to the bathroom, but she makes herself go because she assumes her bladder is full. Pt has been wearing pantyliners for many years and did not mind that but now has to wear briefs due to the amount of leakage. Pt also notices leakage with sneezing/coughing/prolonged activity/lifting.     PATIENT GOALS: Wants to be able to feel the urge to go to the bathroom, not leak as much or wear a brief (return to pantyliners)   UROLOGICAL HISTORY Fluid intake: Yes: water (up to 3 12 oz), unsweetened Kool-Aid with Stevia (2-3 tall cups/day)   Pain with urination: No Fully empty bladder: Yes but after she uses the bathroom, she has more leakage even though she just emptied  Stream: Weak, constant  Urgency: No  Toileting posture: feet flat Frequency: 6x "Just in case": yes Nocturia: 3x but feels little urge, she makes herself go Leakage: Urge to void, Walking to the bathroom, Coughing, Sneezing, Lifting, and Bending forward Pads: Yes Type: briefs Amount: 2-3x/day Bladder control (0-10): 4/10  GASTROINTESTINAL HISTORY Pain with bowel movement: No Type of bowel movement:Type (Bristol Stool Scale) 1 and 4 and Strain Yes Frequency: 1x/day Fully empty rectum: Yes   SEXUAL HISTORY/FUNCTION Pt has no concerns    OBSTETRICAL HISTORY- G0P0    GYNECOLOGICAL HISTORY Hysterectomy: yes, abdominal hysterectomy  Pelvic Organ  Prolapse: None Pain with exam:  Heaviness/pressure:  SUBJECTIVE:  Pt has been practicing HEP (deep core and STSs). Pt has also noticed that with increased stress levels, she has increased urinary leakage. Pt continues to wear briefs in the community for comfort and to make sure she doesn't have an accident but has been able to use Moderate Lvl incontinence pads in the home.   PAIN:  Are you having pain? Yes NPRS scale: comes and goes in the posterior neck     OBJECTIVE:    COGNITION: Overall cognitive status: Within functional limits for tasks assessed     POSTURE: (04/23/22)  Lumbar lordosis: decreased in standing  Thoracic kyphosis: significant in standing and sitting  Iliac crest - WNL Pelvic obliquity - R slightly posteriorly rotated  Increased R shoulder elevation    GAIT:  Distance walked: 100 ft Assistive device utilized:   quad cane Level of assistance: CGA  Comments:  Pt feels at times her R knee wants to give out with the quad cane. Pt also felt urinary leakage while walking.   SENSATION: Deferred 2/2 time constraints  Light touch: , L2-S2 dermatomes  Proprioception:    RANGE OF MOTION:    (Norm range in degrees)  LEFT 04/16/22 RIGHT 04/16/22  Lumbar forward flexion (65):  WNL    Lumbar extension (30): Restricted    Lumbar lateral flexion (25):  WNL WNL  Thoracic and Lumbar rotation (30 degrees):    WNL WNL  Hip Flexion (0-125):   WNL WNL  Hip IR (0-45):  WNL WNL  Hip ER (0-45):  WNL WNL  Hip Adduction:      Hip Abduction (0-40):  WNL WNL  Hip extension (0-15):     (*= pain, Blank rows = not tested)   STRENGTH: MMT    RLE 04/16/22 LLE 04/16/22  Hip Flexion 4 (backward lean) 4 (backward lean)  Hip Extension 4 4  Hip Abduction     Hip Adduction     Hip ER  5 5  Hip IR  5 5  Knee Extension 5 5  Knee Flexion 4 4  Dorsiflexion     Plantarflexion (seated) 5 5  (*= pain, Blank rows = not tested)   SPECIAL TESTS:  FABER (SN 81): negative B FADIR  (SN 94): negative B   PHYSICAL PERFORMANCE MEASURES:   5TSTS: 19 seconds    PALPATION: Abdominal:  Diastasis:  none Rib flare: present B  EXTERNAL PELVIC EXAM: Patient educated on the purpose of the pelvic exam and articulated understanding; patient consented to the exam verbally.  Breath coordination: inconsistent Voluntary Contraction: present, 2/5 MMT with adductor and abdominal activation  Relaxation: delayed Perineal movement with sustained IAP increase ("bear down"): descent, with significant cueing for technique, Valsalva noted Perineal movement with rapid IAP increase ("cough"): no change  (0= no contraction, 1= flicker, 2= weak squeeze, 3= fair squeeze with lift, 4= good squeeze and lift against resistance, 5= strong squeeze against strong resistance)    TODAY'S TREATMENT  Neuromuscular Re-education Reassessment of FOTO:  IE: FOTO - Urinary Problem 40  Today: FOTO Urinary Problem - 66 (22 point change)  Discussion on responses and LTG/STGs listed below  Praised on improvement and progression - 1x having to change pad (wearing pad at night and during the day)   Discussion on urinary retention and how that can cause urinary leakage on the way to the restroom. Discussed how bladder emptying should occur every 2-3 hrs. Pt verbalized understanding.   Discussion on posture in sitting and being more aware of significant forward head and rounded shoulders when looking down to read/look at phone. Observed significant forward thoracic flexion and forward head when filling out FOTO. Pt verbalized understanding.  Patient response to interventions: Pt is pleased with progression.    Patient Education:  Patient provided with HEP: no changes. Patient educated throughout session on appropriate technique and form using multi-modal cueing, HEP, and activity modification. Patient will benefit from further education in order to maximize compliance and understanding for long-term  therapeutic gains.     ASSESSMENT:  Clinical Impression: Patient presents to clinic with excellent motivation to participate in today's session. Pt continues to demonstrate deficits in IAP management, PFM coordination, PFM strength,  posture, mobility and LE strength. Today's session focused on reassessing FOTO and discussing LTGs/STGs. Reassessment of FOTO Urinary Problem (66) demonstrates significant improvement since IE (40).  Pt has been able to use pantyliners for occasional urinary incontinence, but still wears briefs in the community for comfort. Pt notices very rarely any leakage on briefs when out in the community. Pt is also changing pantyliner 1x/day and merely for hygiene. Discussion on posture in sitting and standing which can contribute to urinary leakage. Pt verbalized understanding. Although, Pt demonstrates progress, patient will continue to benefit from skilled therapeutic intervention to address deficits in IAP management, PFM coordination, PFM strength, posture, mobility and LE strength in order to increase PLOF and improve overall QOL.    Objective Impairments: Abnormal gait, decreased activity tolerance, decreased balance, decreased coordination, decreased endurance, decreased mobility, decreased strength, improper body mechanics, postural dysfunction, and pain.   Activity Limitations: lifting, bending, sitting, standing, squatting, sleeping, transfers, continence, toileting, and locomotion level  Personal Factors: Age, Behavior pattern, Past/current experiences, Time since onset of injury/illness/exacerbation, and 1-2 comorbidities: hx of breast cancer and A-fib  are also affecting patient's functional outcome.   Rehab Potential: Fair - age related changes may inhibit progression towards long-term goals and compliance with HEP as improvements in PFM coordination/strength take time.   Clinical Decision Making: Evolving/moderate complexity  Evaluation Complexity:  Moderate   GOALS: Goals reviewed with patient? Yes  SHORT TERM GOALS: Target date: 06/12/22  Patient will decrease intake of caffeinated beverages and other bladder irritants to less than/= 2 cups and report increased water hydration in order to manage urinary leakage and bladder irritation for improved overall QOL and participation at home and in the community. Baseline: unsweetened Kool-Aid with stevia (up to 3 tall cups a day); (9/7) drinking about 3 bottles of water with 1 cup of Kool-aid mixed with water Goal status: MET    LONG TERM GOALS: Target date: 07/02/22  Patient will improve score on FOTO Pelvic Floor outcome measure by at least 5-6 points in order to demonstrate improved IAP management, improved PFM coordination, and overall improved QOL.  Baseline: (7/27): 40 (target is 52); (9/7): 66 on Urinary Problem  Goal status: MET  2.  Patient will report decreased reliance on protective undergarments as indicated by a 24 hour period to demonstrate improved bladder control and allow for increased participation in activities outside of the home. Baseline: incontinence briefs 2-3x/day, (9/7): pantyliners during the day and at night/only changing 1x Goal status: IN PROGRESS  3.  Patient will report confidence in ability to control bladder > 7/10 in order to demonstrate improved function and ability to participate more fully in activities at home and in the community. Baseline: 4/10, (9/7): 8/10 Goal status: MET  4.  Patient will participate in a 5TSTS test and demonstrate improved scores to match age-related norms (14 secs or less) to decrease risk in falls and overall balance as Pt has increased urinary leakage and does not use AD in the home.  Baseline: (7/20): 19 seconds  Goal status: INITIAL  5.  Patient will report less than 5 incidents of stress urinary incontinence over the course of 3 weeks while coughing/sneezing/laughing/prolonged activity in order to demonstrate improved  PFM coordination, strength, and function for improved overall QOL. Baseline: every time with all the above; (9/7): 1-2x over past 3 weeks Goal status: MET    PLAN: PT Frequency: 1x/week  PT Duration: 12 weeks  Planned Interventions: Therapeutic exercises, Therapeutic activity, Neuromuscular re-education, Balance training, Gait training, Patient/Family education, Self Care, Joint mobilization, Spinal mobilization, Cryotherapy, Moist heat, scar mobilization, Taping, and Manual therapy  Plan For Next Session:   seated dead  bug (core), STSs, or UE posture with bands  Marcella Dunnaway, PT, DPT  06/04/2022, 12:37 PM

## 2022-06-11 ENCOUNTER — Ambulatory Visit: Payer: Medicare Other

## 2022-06-11 DIAGNOSIS — R278 Other lack of coordination: Secondary | ICD-10-CM | POA: Diagnosis not present

## 2022-06-11 DIAGNOSIS — M6281 Muscle weakness (generalized): Secondary | ICD-10-CM

## 2022-06-11 DIAGNOSIS — M6289 Other specified disorders of muscle: Secondary | ICD-10-CM

## 2022-06-11 DIAGNOSIS — R293 Abnormal posture: Secondary | ICD-10-CM

## 2022-06-11 NOTE — Therapy (Signed)
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 01/2012 MV: EF 74%, no ischemia.   Hyperlipidemia    Osteoarthritis of both hips    And knees as well as back.    PAF (paroxysmal atrial fibrillation) ([place])    a. 10/2017 Event monitor: 46 SVTs (longest 19 beats, avg 110 bpm). Afib (<1% burden w/ avg HR 142)-->Eliquis (CHA2DS2VASc = 4).   Valvular heart disease    a. 2013 Echo: Moderate MR and moderate-severe TR on echocardiogram; b. Echo 2015: mild MR & TR. Nl EF, mild LVH.   Vertigo    none in several years   Past Surgical History:  Procedure Laterality Date   BREAST EXCISIONAL BIOPSY Right 1971, 1981   negative   CATARACT EXTRACTION W/PHACO Right 03/28/2019   Procedure: CATARACT EXTRACTION PHACO AND INTRAOCULAR LENS PLACEMENT ([place]) RIGHT;  Surgeon: Birder Robson, MD;  Location: [facility];  Service: Ophthalmology;  Laterality: Right;   COLONOSCOPY     Holter Monitor  May 2013   @ [facility] Cardiology: Occasional PVCs (benign)   NM MYOVIEW LTD  May 2013; Dec 2015   @ [facility] Cardiology: a) 2013: Exercise for 2:30 min (only), hypertensive response no ischemic changes. He has 74%, normal wall motion, no evidence of ischemia or infarction.; b) Ex 3:00 min (dyspnea) 3.3 METs HR 146 (106% MPHR) - no ECG changes, E 70%, no RWMA, No ischemia  or infarction   ORIF ANKLE FRACTURE Left 09/07/2016   Procedure: OPEN REDUCTION INTERNAL FIXATION (ORIF) ANKLE FRACTURE;  Surgeon: Dereck Leep, MD;  Location: [facility];  Service: Orthopedics;  Laterality: Left;   TONGUE BIOPSY     TONSILLECTOMY     TOTAL ABDOMINAL HYSTERECTOMY W/ BILATERAL SALPINGOOPHORECTOMY     TRANSTHORACIC ECHOCARDIOGRAM  May 2013; May 2015   @ [facility] Cardiology: a) 2013 : Normal LV function, EF 65%, mild BiAE, Mild LVH, Mod MR, Mod-Severe TR; b) normal LV size, EF> 55%, mild LVH, mild MR, mild TR and   Patient Active Problem List   Diagnosis Date Noted   Paroxysmal atrial fibrillation ([place]) 12/30/2017   Paroxysmal tachycardia ([place]) 11/19/2017   Palpitations 11/19/2017   Dizziness 11/19/2017   Ankle fracture, bimalleolar, closed, left, initial encounter 09/07/2016   Tachycardia palpitations 12/14/2014   Frequent PVCs 12/14/2014   Asymptomatic varicose veins of bilateral lower extremities: With mild edema 12/14/2014   Essential hypertension 12/14/2014   Mild mitral and tricuspid regurgitation on recent echo     PCP: Maryland Pink, MD  REFERRING PROVIDER: Royston Cowper, MD   REFERRING DIAG:  N39.41 (ICD-10-CM) - Urge incontinence  N39.3 (ICD-10-CM) - Stress incontinence (female) (female)   THERAPY  DIAG:  Other lack of coordination  Muscle weakness (generalized)  Abnormal posture  Pelvic floor dysfunction  Rationale for Evaluation and Treatment: Rehabilitation  ONSET DATE: 2-3 years but worse about a month ago  PRECAUTIONS: None  WEIGHT BEARING RESTRICTIONS: No  FALLS:  Has patient fallen in last 6 months? No  OCCUPATION/SOCIAL ACTIVITIES: cooking, household chores, walking, would like to walk outdoors or in the mall more  PLOF: Independent with basic ADLs, Independent with household mobility without device, and Independent with community mobility with device   CHIEF CONCERN: Pt arrived with quad cane. Pt sprained R ankle twice  when she was younger and ever since has had problems with her R leg. Pt describes it as the R leg feels unstable at times. Pt only uses the quad cane when outside the home. Pt does have a seat in the shower and is IND with ADLs and mobility. Pt has noticed urinary leakage after sitting/lying down for prolonged periods and then goes to stand up. Pt had a cortisone shot in the R shoulder and feels her urinary leakage has been worse since then (a month ago). The Dr told the Pt to drink more water and she has been but it causes more leakage. Pt reports not feeling a sensation of needing to go to the bathroom, but she makes herself go because she assumes her bladder is full. Pt has been wearing pantyliners for many years and did not mind that but now has to wear briefs due to the amount of leakage. Pt also notices leakage with sneezing/coughing/prolonged activity/lifting.     PATIENT GOALS: Wants to be able to feel the urge to go to the bathroom, not leak as much or wear a brief (return to pantyliners)   UROLOGICAL HISTORY Fluid intake: Yes: water (up to 3 12 oz), unsweetened Kool-Aid with Stevia (2-3 tall cups/day)   Pain with urination: No Fully empty bladder: Yes but after she uses the bathroom, she has more leakage even though she just emptied  Stream: Weak, constant  Urgency: No  Toileting posture: feet flat Frequency: 6x "Just in case": yes Nocturia: 3x but feels little urge, she makes herself go Leakage: Urge to void, Walking to the bathroom, Coughing, Sneezing, Lifting, and Bending forward Pads: Yes Type: briefs Amount: 2-3x/day Bladder control (0-10): 4/10  GASTROINTESTINAL HISTORY Pain with bowel movement: No Type of bowel movement:Type (Bristol Stool Scale) 1 and 4 and Strain Yes Frequency: 1x/day Fully empty rectum: Yes   SEXUAL HISTORY/FUNCTION Pt has no concerns    OBSTETRICAL HISTORY- G0P0    GYNECOLOGICAL HISTORY Hysterectomy: yes, abdominal hysterectomy  Pelvic Organ  Prolapse: None Pain with exam:  Heaviness/pressure:  SUBJECTIVE:  Pt arrived 10 mins after scheduled session. Pt has noticed some more leakage but has been while lifting laundry baskets and noticed it with coughing.   PAIN:  Are you having pain? Yes NPRS scale: achy, 5/10     OBJECTIVE:    COGNITION: Overall cognitive status: Within functional limits for tasks assessed     POSTURE: (04/23/22)  Lumbar lordosis: decreased in standing  Thoracic kyphosis: significant in standing and sitting  Iliac crest - WNL Pelvic obliquity - R slightly posteriorly rotated  Increased R shoulder elevation    GAIT:  Distance walked: 100 ft Assistive device utilized: quad cane Level of assistance: CGA  Comments:  Pt feels at times her R knee wants to give out with the quad cane. Pt also felt urinary leakage while walking.   SENSATION: Deferred  2/2 time constraints  Light touch: , L2-S2 dermatomes  Proprioception:    RANGE OF MOTION:    (Norm range in degrees)  LEFT 04/16/22 RIGHT 04/16/22  Lumbar forward flexion (65):  WNL    Lumbar extension (30): Restricted    Lumbar lateral flexion (25):  WNL WNL  Thoracic and Lumbar rotation (30 degrees):    WNL WNL  Hip Flexion (0-125):   WNL WNL  Hip IR (0-45):  WNL WNL  Hip ER (0-45):  WNL WNL  Hip Adduction:      Hip Abduction (0-40):  WNL WNL  Hip extension (0-15):     (*= pain, Blank rows = not tested)   STRENGTH: MMT    RLE 04/16/22 LLE 04/16/22  Hip Flexion 4 (backward lean) 4 (backward lean)  Hip Extension 4 4  Hip Abduction     Hip Adduction     Hip ER  5 5  Hip IR  5 5  Knee Extension 5 5  Knee Flexion 4 4  Dorsiflexion     Plantarflexion (seated) 5 5  (*= pain, Blank rows = not tested)   SPECIAL TESTS:  FABER (SN 81): negative B FADIR (SN 94): negative B   PHYSICAL PERFORMANCE MEASURES:   5TSTS: 19 seconds    PALPATION: Abdominal:  Diastasis:  none Rib flare: present B  EXTERNAL PELVIC EXAM: Patient  educated on the purpose of the pelvic exam and articulated understanding; patient consented to the exam verbally.  Breath coordination: inconsistent Voluntary Contraction: present, 2/5 MMT with adductor and abdominal activation  Relaxation: delayed Perineal movement with sustained IAP increase ("bear down"): descent, with significant cueing for technique, Valsalva noted Perineal movement with rapid IAP increase ("cough"): no change  (0= no contraction, 1= flicker, 2= weak squeeze, 3= fair squeeze with lift, 4= good squeeze and lift against resistance, 5= strong squeeze against strong resistance)    TODAY'S TREATMENT  Neuromuscular Re-education Lifting mechanics with weighted ball, to simulate carrying laundry basket, on raised surface (increased bed height) with coordinated breathing for improved IAP management  Required seated rest breaks due to increased pain in the R knee   Seated diaphragmatic breathing for improved IAP management and downregulation of nervous system  Seated TrA activation with coordinated exhale for improved IAP management, VCs and TCs required  Modified dead bug added for increased challenge  Cueing for proper TrA activation   Seated hip abduction with YTB for improved abd strengthening to aid in STSs and in gait as Pt presents with genu valgus, cueing required for proper form    Patient response to interventions: Pt reports feeling TrA activate more with added UE challenge   Patient Education:  Patient provided with HEP: modified dead bug in seated, seated abduction with resistance. Patient educated throughout session on appropriate technique and form using multi-modal cueing, HEP, and activity modification. Patient will benefit from further education in order to maximize compliance and understanding for long-term therapeutic gains.     ASSESSMENT:  Clinical Impression: Patient presents to clinic with excellent motivation to participate in today's session.  Pt continues to demonstrate deficits in IAP management, PFM coordination, PFM strength,  posture, mobility and LE strength. Pt reports she has more urinary leakage with increased activity in the home particularly while lifting/bending. Part of session focused on lifting mechanics and coordinating with breath for improved IAP management. However, due to pain in the R knee Pt required seated rest breaks. Brief discussion on DPT recommending to schedule an appt with  existing orthopedic doctor as R knee pain limits her mobility and progression during sessions. Pt required moderate VCs and TCs during progression of TrA activation in seated to decrease bodily compensations (increased B rounding of shoulders and forward head). Patient  will continue to benefit from skilled therapeutic intervention to address deficits in IAP management, PFM coordination, PFM strength, posture, mobility and LE strength in order to increase PLOF and improve overall QOL.    Objective Impairments: Abnormal gait, decreased activity tolerance, decreased balance, decreased coordination, decreased endurance, decreased mobility, decreased strength, improper body mechanics, postural dysfunction, and pain.   Activity Limitations: lifting, bending, sitting, standing, squatting, sleeping, transfers, continence, toileting, and locomotion level  Personal Factors: Age, Behavior pattern, Past/current experiences, Time since onset of injury/illness/exacerbation, and 1-2 comorbidities: hx of breast cancer and A-fib  are also affecting patient's functional outcome.   Rehab Potential: Fair - age related changes may inhibit progression towards long-term goals and compliance with HEP as improvements in PFM coordination/strength take time.   Clinical Decision Making: Evolving/moderate complexity  Evaluation Complexity: Moderate   GOALS: Goals reviewed with patient? Yes  SHORT TERM GOALS: Target date: 06/12/22  Patient will decrease intake of  caffeinated beverages and other bladder irritants to less than/= 2 cups and report increased water hydration in order to manage urinary leakage and bladder irritation for improved overall QOL and participation at home and in the community. Baseline: unsweetened Kool-Aid with stevia (up to 3 tall cups a day); (9/7) drinking about 3 bottles of water with 1 cup of Kool-aid mixed with water Goal status: MET    LONG TERM GOALS: Target date: 07/02/22  Patient will improve score on FOTO Pelvic Floor outcome measure by at least 5-6 points in order to demonstrate improved IAP management, improved PFM coordination, and overall improved QOL.  Baseline: (7/27): 40 (target is 52); (9/7): 66 on Urinary Problem  Goal status: MET  2.  Patient will report decreased reliance on protective undergarments as indicated by a 24 hour period to demonstrate improved bladder control and allow for increased participation in activities outside of the home. Baseline: incontinence briefs 2-3x/day, (9/7): pantyliners during the day and at night/only changing 1x Goal status: IN PROGRESS  3.  Patient will report confidence in ability to control bladder > 7/10 in order to demonstrate improved function and ability to participate more fully in activities at home and in the community. Baseline: 4/10, (9/7): 8/10 Goal status: MET  4.  Patient will participate in a 5TSTS test and demonstrate improved scores to match age-related norms (14 secs or less) to decrease risk in falls and overall balance as Pt has increased urinary leakage and does not use AD in the home.  Baseline: (7/20): 19 seconds  Goal status: INITIAL  5.  Patient will report less than 5 incidents of stress urinary incontinence over the course of 3 weeks while coughing/sneezing/laughing/prolonged activity in order to demonstrate improved PFM coordination, strength, and function for improved overall QOL. Baseline: every time with all the above; (9/7): 1-2x over past 3  weeks Goal status: MET    PLAN: PT Frequency: 1x/week  PT Duration: 12 weeks  Planned Interventions: Therapeutic exercises, Therapeutic activity, Neuromuscular re-education, Balance training, Gait training, Patient/Family education, Self Care, Joint mobilization, Spinal mobilization, Cryotherapy, Moist heat, scar mobilization, Taping, and Manual therapy  Plan For Next Session:  progress note, STSs with band, or UE posture with bands  Jewell Haught, PT, DPT  06/11/2022, 12:42 PM

## 2022-06-18 ENCOUNTER — Ambulatory Visit: Payer: Medicare Other

## 2022-06-18 DIAGNOSIS — M6281 Muscle weakness (generalized): Secondary | ICD-10-CM

## 2022-06-18 DIAGNOSIS — R278 Other lack of coordination: Secondary | ICD-10-CM

## 2022-06-18 DIAGNOSIS — R293 Abnormal posture: Secondary | ICD-10-CM

## 2022-06-18 DIAGNOSIS — M6289 Other specified disorders of muscle: Secondary | ICD-10-CM

## 2022-06-18 NOTE — Therapy (Signed)
OUTPATIENT PHYSICAL THERAPY FEMALE PELVIC PROGRESS NOTE/RE-CERT  REPORTING PERIOD 04/09/22-06/18/22   Patient Name: Brandi Scott MRN: [id number] DOB:[birth year], 88 y.o., female Today's Date: 06/18/2022   PT End of Session - 06/18/22 1108     Visit Number 10    Number of Visits 20    Date for PT Re-Evaluation 08/27/22    Authorization Type IE: 04/09/22; PN/RC: 06/18/22    PT Start Time 1110    PT Stop Time 1140    PT Time Calculation (min) 30 min    Activity Tolerance Patient tolerated treatment well             Past Medical History:  Diagnosis Date   Chronic radicular low back pain    Essential hypertension    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 01/2012 MV: EF 74%, no ischemia.   Hyperlipidemia    Osteoarthritis of both hips    And knees as well as back.    PAF (paroxysmal atrial fibrillation) ([place])    a. 10/2017 Event monitor: 46 SVTs (longest 19 beats, avg 110 bpm). Afib (<1% burden w/ avg HR 142)-->Eliquis (CHA2DS2VASc = 4).   Valvular heart disease    a. 2013 Echo: Moderate MR and moderate-severe TR on echocardiogram; b. Echo 2015: mild MR & TR. Nl EF, mild LVH.   Vertigo    none in several years   Past Surgical History:  Procedure Laterality Date   BREAST EXCISIONAL BIOPSY Right 1971, 1981   negative   CATARACT EXTRACTION W/PHACO Right 03/28/2019   Procedure: CATARACT EXTRACTION PHACO AND INTRAOCULAR LENS PLACEMENT ([place]) RIGHT;  Surgeon: Birder Robson, MD;  Location: [facility];  Service: Ophthalmology;  Laterality: Right;   COLONOSCOPY     Holter Monitor  May 2013   @ [facility]: Occasional PVCs (benign)   NM MYOVIEW LTD  May 2013; Dec 2015   @ [facility] Cardiology: a) 2013: Exercise for 2:30 min (only), hypertensive response no ischemic changes. He has 74%, normal wall motion, no evidence of ischemia or infarction.; b) Ex 3:00 min (dyspnea) 3.3 METs  HR 146 (106% MPHR) - no ECG changes, E 70%, no RWMA, No ischemia or infarction   ORIF ANKLE FRACTURE Left 09/07/2016   Procedure: OPEN REDUCTION INTERNAL FIXATION (ORIF) ANKLE FRACTURE;  Surgeon: Dereck Leep, MD;  Location: [facility];  Service: Orthopedics;  Laterality: Left;   TONGUE BIOPSY     TONSILLECTOMY     TOTAL ABDOMINAL HYSTERECTOMY W/ BILATERAL SALPINGOOPHORECTOMY     TRANSTHORACIC ECHOCARDIOGRAM  May 2013; May 2015   @ [facility] Cardiology: a) 2013 : Normal LV function, EF 65%, mild BiAE, Mild LVH, Mod MR, Mod-Severe TR; b) normal LV size, EF> 55%, mild LVH, mild MR, mild TR and   Patient Active Problem List   Diagnosis Date Noted   Paroxysmal atrial fibrillation ([place]) 12/30/2017   Paroxysmal tachycardia ([place]) 11/19/2017   Palpitations 11/19/2017   Dizziness 11/19/2017   Ankle fracture, bimalleolar, closed, left, initial encounter 09/07/2016   Tachycardia palpitations 12/14/2014   Frequent PVCs 12/14/2014   Asymptomatic varicose veins of bilateral lower extremities: With mild edema 12/14/2014   Essential hypertension 12/14/2014   Mild mitral and tricuspid regurgitation on recent echo     PCP: Maryland Pink, MD  REFERRING PROVIDER: Royston Cowper, MD   REFERRING DIAG:  N39.41 (ICD-10-CM) - Urge incontinence  N39.3 (ICD-10-CM) -  Stress incontinence (female) (female)   THERAPY DIAG:  Other lack of coordination  Muscle weakness (generalized)  Abnormal posture  Pelvic floor dysfunction  Rationale for Evaluation and Treatment: Rehabilitation  ONSET DATE: 2-3 years but worse about a month ago  PRECAUTIONS: None  WEIGHT BEARING RESTRICTIONS: No  FALLS:  Has patient fallen in last 6 months? No  OCCUPATION/SOCIAL ACTIVITIES: cooking, household chores, walking, would like to walk outdoors or in the mall more  PLOF: Independent with basic ADLs, Independent with household mobility without device, and Independent with community mobility with device   CHIEF  CONCERN: Pt arrived with quad cane. Pt sprained R ankle twice when she was younger and ever since has had problems with her R leg. Pt describes it as the R leg feels unstable at times. Pt only uses the quad cane when outside the home. Pt does have a seat in the shower and is IND with ADLs and mobility. Pt has noticed urinary leakage after sitting/lying down for prolonged periods and then goes to stand up. Pt had a cortisone shot in the R shoulder and feels her urinary leakage has been worse since then (a month ago). The Dr told the Pt to drink more water and she has been but it causes more leakage. Pt reports not feeling a sensation of needing to go to the bathroom, but she makes herself go because she assumes her bladder is full. Pt has been wearing pantyliners for many years and did not mind that but now has to wear briefs due to the amount of leakage. Pt also notices leakage with sneezing/coughing/prolonged activity/lifting.     PATIENT GOALS: Wants to be able to feel the urge to go to the bathroom, not leak as much or wear a brief (return to pantyliners)   UROLOGICAL HISTORY Fluid intake: Yes: water (up to 3 12 oz), unsweetened Kool-Aid with Stevia (2-3 tall cups/day)   Pain with urination: No Fully empty bladder: Yes but after she uses the bathroom, she has more leakage even though she just emptied  Stream: Weak, constant  Urgency: No  Toileting posture: feet flat Frequency: 6x "Just in case": yes Nocturia: 3x but feels little urge, she makes herself go Leakage: Urge to void, Walking to the bathroom, Coughing, Sneezing, Lifting, and Bending forward Pads: Yes Type: briefs Amount: 2-3x/day Bladder control (0-10): 4/10  GASTROINTESTINAL HISTORY Pain with bowel movement: No Type of bowel movement:Type (Bristol Stool Scale) 1 and 4 and Strain Yes Frequency: 1x/day Fully empty rectum: Yes   SEXUAL HISTORY/FUNCTION Pt has no concerns    OBSTETRICAL HISTORY- G0P0    GYNECOLOGICAL  HISTORY Hysterectomy: yes, abdominal hysterectomy  Pelvic Organ Prolapse: None Pain with exam:  Heaviness/pressure:  SUBJECTIVE:  Pt arrived 10 mins after scheduled session. Pt did have an episode of increased heart palpitations yesterday with SOB and dizziness. Pt said it last from 1-10pm. Pt is on Eliquis.   PAIN:  Are you having pain? No     OBJECTIVE:    COGNITION: Overall cognitive status: Within functional limits for tasks assessed     POSTURE: (04/23/22)  Lumbar lordosis: decreased in standing  Thoracic kyphosis: significant in standing and sitting  Iliac crest - WNL Pelvic obliquity - R slightly posteriorly rotated  Increased R shoulder elevation    GAIT:  Distance walked: 100 ft Assistive device utilized: quad cane Level of assistance: CGA  Comments:  Pt feels at times her R knee wants to give out with the quad cane. Pt also  felt urinary leakage while walking.   SENSATION: Deferred 2/2 time constraints  Light touch: , L2-S2 dermatomes  Proprioception:    RANGE OF MOTION:    (Norm range in degrees)  LEFT 04/16/22 RIGHT 04/16/22  Lumbar forward flexion (65):  WNL    Lumbar extension (30): Restricted    Lumbar lateral flexion (25):  WNL WNL  Thoracic and Lumbar rotation (30 degrees):    WNL WNL  Hip Flexion (0-125):   WNL WNL  Hip IR (0-45):  WNL WNL  Hip ER (0-45):  WNL WNL  Hip Adduction:      Hip Abduction (0-40):  WNL WNL  Hip extension (0-15):     (*= pain, Blank rows = not tested)   STRENGTH: MMT    RLE 04/16/22 LLE 04/16/22  Hip Flexion 4 (backward lean) 4 (backward lean)  Hip Extension 4 4  Hip Abduction     Hip Adduction     Hip ER  5 5  Hip IR  5 5  Knee Extension 5 5  Knee Flexion 4 4  Dorsiflexion     Plantarflexion (seated) 5 5  (*= pain, Blank rows = not tested)   SPECIAL TESTS:  FABER (SN 81): negative B FADIR (SN 94): negative B   PHYSICAL PERFORMANCE MEASURES:   5TSTS: 19 seconds    PALPATION: Abdominal:   Diastasis:  none Rib flare: present B  EXTERNAL PELVIC EXAM: Patient educated on the purpose of the pelvic exam and articulated understanding; patient consented to the exam verbally.  Breath coordination: inconsistent Voluntary Contraction: present, 2/5 MMT with adductor and abdominal activation  Relaxation: delayed Perineal movement with sustained IAP increase ("bear down"): descent, with significant cueing for technique, Valsalva noted Perineal movement with rapid IAP increase ("cough"): no change  (0= no contraction, 1= flicker, 2= weak squeeze, 3= fair squeeze with lift, 4= good squeeze and lift against resistance, 5= strong squeeze against strong resistance)    TODAY'S TREATMENT  Therapeutic Activity: Time taken to check BP due to symptoms yesterday of heart palpitations, SOB, dizziness Pt currently asymptomatic  138/84 mmHg, 70 BPM  Discussion on investing in a wrist BP monitor to self check as needed as Pt is home most of the time by herself. Pt verbalized understanding.    Neuromuscular Re-education Review LTGs and STGs for reassessment: 4/6 goals met  Discussion on nocturia and decreasing "just in case" trips to the bathroom   Discussion on calling urology in relation to medication sample of Gentessa. Pt noticed she took that in the morning yesterday as prescribed and symptoms of a-fib started to arise.   Lengthy discussion on urinary urgency and given handout.   Patient response to interventions: Pt comfortable to progress to every 2 week sessions   Patient Education:  Patient provided with HEP: urinary urgency handout. Patient educated throughout session on appropriate technique and form using multi-modal cueing, HEP, and activity modification. Patient will benefit from further education in order to maximize compliance and understanding for long-term therapeutic gains.     ASSESSMENT:  Clinical Impression: Patient presents to clinic with excellent motivation to  participate in today's session. Upon reassessment of goals, Pt demonstrates significant improvement in urinary leakage compared to IE on 04/09/22. Pt reports decreased stress urinary incontinence over the past few weeks and decreased urinary leakage where she only requires 1x/day of changing her incontinence pad. Initially, Pt wearing incontinence briefs in the home and in the community changing 2-3x/day. Pt reports more hesitancy wearing incontinence pads  in the community due to fear of increased leakage. After some discussion, Pt reports instances of urine retention especially when out in the community and that is where she has noticed more urinary leakage. Pt has met 4/6 goals listed below. Pt has also progressed with improved time on 5TSTS, 17 seconds, compared to 19 secs on IE but is still at risk for increased falls. Although Pt has demonstrated improvement, Pt will continue to benefit from skilled therapeutic intervention to address deficits in IAP management, PFM coordination, PFM strength, posture, mobility and LE strength in order to increase PLOF and improve overall QOL.    Objective Impairments: Abnormal gait, decreased activity tolerance, decreased balance, decreased coordination, decreased endurance, decreased mobility, decreased strength, improper body mechanics, postural dysfunction, and pain.   Activity Limitations: lifting, bending, sitting, standing, squatting, sleeping, transfers, continence, toileting, and locomotion level  Personal Factors: Age, Behavior pattern, Past/current experiences, Time since onset of injury/illness/exacerbation, and 1-2 comorbidities: hx of breast cancer and A-fib  are also affecting patient's functional outcome.   Rehab Potential: Fair - age related changes may inhibit progression towards long-term goals and compliance with HEP as improvements in PFM coordination/strength take time.   Clinical Decision Making: Evolving/moderate complexity  Evaluation  Complexity: Moderate   GOALS: Goals reviewed with patient? Yes  SHORT TERM GOALS: Target date: 06/12/22  Patient will decrease intake of caffeinated beverages and other bladder irritants to less than/= 2 cups and report increased water hydration in order to manage urinary leakage and bladder irritation for improved overall QOL and participation at home and in the community. Baseline: unsweetened Kool-Aid with stevia (up to 3 tall cups a day); (9/7) drinking about 3 bottles of water with 1 cup of Kool-aid mixed with water Goal status: MET    LONG TERM GOALS: Target date: 08/27/22  Patient will improve score on FOTO Pelvic Floor outcome measure by at least 5-6 points in order to demonstrate improved IAP management, improved PFM coordination, and overall improved QOL.  Baseline: (7/27): 40 (target is 52); (9/7): 66 on Urinary Problem  Goal status: MET  2.  Patient will report decreased reliance on protective undergarments as indicated by a 24 hour period to demonstrate improved bladder control and allow for increased participation in activities outside of the home. Baseline: incontinence briefs 2-3x/day, (9/7): pantyliners during the day and at night/only changing 1x; (9/21): using pantyliner during day/night, incontinence brief out in the community/changing 1x Goal status: IN PROGRESS  3.  Patient will report confidence in ability to control bladder > 7/10 in order to demonstrate improved function and ability to participate more fully in activities at home and in the community. Baseline: 4/10, (9/7): 8/10 Goal status: MET  4.  Patient will participate in a 5TSTS test and demonstrate improved scores to match age-related norms (14 secs or less) to decrease risk in falls and overall balance as Pt has increased urinary leakage and does not use AD in the home.  Baseline: (7/20): 19 seconds; (9/21):  17 seconds  Goal status: IN PROGRESS  5.  Patient will report less than 5 incidents of stress  urinary incontinence over the course of 3 weeks while coughing/sneezing/laughing/prolonged activity in order to demonstrate improved PFM coordination, strength, and function for improved overall QOL. Baseline: every time with all the above; (9/7): 1-2x over past 3 weeks Goal status: MET    PLAN: PT Frequency: 1x/week  PT Duration: 12 weeks  Planned Interventions: Therapeutic exercises, Therapeutic activity, Neuromuscular re-education, Balance training, Gait training, Patient/Family  education, Self Care, Joint mobilization, Spinal mobilization, Cryotherapy, Moist heat, scar mobilization, Taping, and Manual therapy  Plan For Next Session:  STSs with band, or UE posture with bands, challenge deep core with seated pallof?  [employer], PT, DPT  06/18/2022, 12:41 PM

## 2022-06-25 ENCOUNTER — Ambulatory Visit: Payer: Medicare Other

## 2022-06-26 ENCOUNTER — Ambulatory Visit: Payer: Medicare Other | Admitting: Cardiovascular Disease

## 2022-07-02 ENCOUNTER — Ambulatory Visit: Payer: Medicare Other | Attending: Urology

## 2022-07-02 DIAGNOSIS — R278 Other lack of coordination: Secondary | ICD-10-CM | POA: Diagnosis present

## 2022-07-02 DIAGNOSIS — M6289 Other specified disorders of muscle: Secondary | ICD-10-CM | POA: Diagnosis present

## 2022-07-02 DIAGNOSIS — M6281 Muscle weakness (generalized): Secondary | ICD-10-CM

## 2022-07-02 DIAGNOSIS — R293 Abnormal posture: Secondary | ICD-10-CM

## 2022-07-02 NOTE — Therapy (Signed)
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 01/2012 MV: EF 74%, no ischemia.   Hyperlipidemia    Osteoarthritis of both hips    And knees as well as back.    PAF (paroxysmal atrial fibrillation) ([place])    a. 10/2017 Event monitor: 46 SVTs (longest 19 beats, avg 110 bpm). Afib (<1% burden w/ avg HR 142)-->Eliquis (CHA2DS2VASc = 4).   Valvular heart disease    a. 2013 Echo: Moderate MR and moderate-severe TR on echocardiogram; b. Echo 2015: mild MR & TR. Nl EF, mild LVH.   Vertigo    none in several years   Past Surgical History:  Procedure Laterality Date   BREAST EXCISIONAL BIOPSY Right 1971, 1981   negative   CATARACT EXTRACTION W/PHACO Right 03/28/2019   Procedure: CATARACT EXTRACTION PHACO AND INTRAOCULAR LENS PLACEMENT (Axis) RIGHT;  Surgeon: Birder Robson, MD;  Location: [facility];  Service: Ophthalmology;  Laterality: Right;   COLONOSCOPY     Holter Monitor  May 2013   @ [facility]: Occasional PVCs (benign)   NM MYOVIEW LTD  May 2013; Dec 2015   @ [facility]: a) 2013: Exercise for 2:30 min (only), hypertensive response no ischemic changes. He has 74%, normal wall motion, no evidence of ischemia or infarction.; b) Ex 3:00 min (dyspnea) 3.3 METs HR 146 (106% MPHR) - no ECG changes, E 70%, no  RWMA, No ischemia or infarction   ORIF ANKLE FRACTURE Left 09/07/2016   Procedure: OPEN REDUCTION INTERNAL FIXATION (ORIF) ANKLE FRACTURE;  Surgeon: Dereck Leep, MD;  Location: [facility];  Service: Orthopedics;  Laterality: Left;   TONGUE BIOPSY     TONSILLECTOMY     TOTAL ABDOMINAL HYSTERECTOMY W/ BILATERAL SALPINGOOPHORECTOMY     TRANSTHORACIC ECHOCARDIOGRAM  May 2013; May 2015   @ [facility]: a) 2013 : Normal LV function, EF 65%, mild BiAE, Mild LVH, Mod MR, Mod-Severe TR; b) normal LV size, EF> 55%, mild LVH, mild MR, mild TR and   Patient Active Problem List   Diagnosis Date Noted   Paroxysmal atrial fibrillation ([place]) 12/30/2017   Paroxysmal tachycardia ([place]) 11/19/2017   Palpitations 11/19/2017   Dizziness 11/19/2017   Ankle fracture, bimalleolar, closed, left, initial encounter 09/07/2016   Tachycardia palpitations 12/14/2014   Frequent PVCs 12/14/2014   Asymptomatic varicose veins of bilateral lower extremities: With mild edema 12/14/2014   Essential hypertension 12/14/2014   Mild mitral and tricuspid regurgitation on recent echo     PCP: Maryland Pink, MD  REFERRING PROVIDER: Royston Cowper, MD   REFERRING DIAG:  N39.41 (ICD-10-CM) - Urge incontinence  N39.3 (ICD-10-CM) - Stress incontinence (female) (female)  THERAPY DIAG:  Other lack of coordination  Muscle weakness (generalized)  Abnormal posture  Pelvic floor dysfunction  Rationale for Evaluation and Treatment: Rehabilitation  ONSET DATE: 2-3 years but worse about a month ago  PRECAUTIONS: None  WEIGHT BEARING RESTRICTIONS: No  FALLS:  Has patient fallen in last 6 months? No  OCCUPATION/SOCIAL ACTIVITIES: cooking, household chores, walking, would like to walk outdoors or in the mall more  PLOF: Independent with basic ADLs, Independent with household mobility without device, and Independent with community mobility with device   CHIEF CONCERN: Pt arrived with quad cane. Pt  sprained R ankle twice when she was younger and ever since has had problems with her R leg. Pt describes it as the R leg feels unstable at times. Pt only uses the quad cane when outside the home. Pt does have a seat in the shower and is IND with ADLs and mobility. Pt has noticed urinary leakage after sitting/lying down for prolonged periods and then goes to stand up. Pt had a cortisone shot in the R shoulder and feels her urinary leakage has been worse since then (a month ago). The Dr told the Pt to drink more water and she has been but it causes more leakage. Pt reports not feeling a sensation of needing to go to the bathroom, but she makes herself go because she assumes her bladder is full. Pt has been wearing pantyliners for many years and did not mind that but now has to wear briefs due to the amount of leakage. Pt also notices leakage with sneezing/coughing/prolonged activity/lifting.     PATIENT GOALS: Wants to be able to feel the urge to go to the bathroom, not leak as much or wear a brief (return to pantyliners)   UROLOGICAL HISTORY Fluid intake: Yes: water (up to 3 12 oz), unsweetened Kool-Aid with Stevia (2-3 tall cups/day)   Pain with urination: No Fully empty bladder: Yes but after she uses the bathroom, she has more leakage even though she just emptied  Stream: Weak, constant  Urgency: No  Toileting posture: feet flat Frequency: 6x "Just in case": yes Nocturia: 3x but feels little urge, she makes herself go Leakage: Urge to void, Walking to the bathroom, Coughing, Sneezing, Lifting, and Bending forward Pads: Yes Type: briefs Amount: 2-3x/day Bladder control (0-10): 4/10  GASTROINTESTINAL HISTORY Pain with bowel movement: No Type of bowel movement:Type (Bristol Stool Scale) 1 and 4 and Strain Yes Frequency: 1x/day Fully empty rectum: Yes   SEXUAL HISTORY/FUNCTION Pt has no concerns    OBSTETRICAL HISTORY- G0P0    GYNECOLOGICAL HISTORY Hysterectomy: yes, abdominal  hysterectomy  Pelvic Organ Prolapse: None Pain with exam:  Heaviness/pressure:  SUBJECTIVE:  Pt has been doing well over the past 2 weeks. Pt reports urinary leakage but still wearing pad in the home with minimal leakage and only having to change one time. Pt continues to wear brief in the community for comfort as she does not know how long she will be out sometimes.    PAIN:  Are you having pain? No     OBJECTIVE:    COGNITION: Overall cognitive status: Within functional limits for tasks assessed     POSTURE: (04/23/22)  Lumbar lordosis: decreased in standing  Thoracic kyphosis: significant in standing and sitting  Iliac crest - WNL Pelvic obliquity - R slightly posteriorly rotated  Increased R shoulder elevation    GAIT:  Distance walked: 100 ft Assistive device utilized: quad cane Level of assistance: CGA  Comments:  Pt feels at times her R knee wants to give out with the quad cane. Pt also felt urinary leakage while walking.   SENSATION: Deferred 2/2 time constraints  Light touch: , L2-S2 dermatomes  Proprioception:    RANGE OF MOTION:    (Norm range in degrees)  LEFT 04/16/22 RIGHT 04/16/22  Lumbar forward flexion (65):  WNL    Lumbar extension (30): Restricted    Lumbar lateral flexion (25):  WNL WNL  Thoracic and Lumbar rotation (30 degrees):    WNL WNL  Hip Flexion (0-125):   WNL WNL  Hip IR (0-45):  WNL WNL  Hip ER (0-45):  WNL WNL  Hip Adduction:      Hip Abduction (0-40):  WNL WNL  Hip extension (0-15):     (*= pain, Blank rows = not tested)   STRENGTH: MMT    RLE 04/16/22 LLE 04/16/22  Hip Flexion 4 (backward lean) 4 (backward lean)  Hip Extension 4 4  Hip Abduction     Hip Adduction     Hip ER  5 5  Hip IR  5 5  Knee Extension 5 5  Knee Flexion 4 4  Dorsiflexion     Plantarflexion (seated) 5 5  (*= pain, Blank rows = not tested)   SPECIAL TESTS:  FABER (SN 81): negative B FADIR (SN 94): negative B   PHYSICAL PERFORMANCE  MEASURES:   5TSTS: 19 seconds    PALPATION: Abdominal:  Diastasis:  none Rib flare: present B  EXTERNAL PELVIC EXAM: Patient educated on the purpose of the pelvic exam and articulated understanding; patient consented to the exam verbally.  Breath coordination: inconsistent Voluntary Contraction: present, 2/5 MMT with adductor and abdominal activation  Relaxation: delayed Perineal movement with sustained IAP increase ("bear down"): descent, with significant cueing for technique, Valsalva noted Perineal movement with rapid IAP increase ("cough"): no change  (0= no contraction, 1= flicker, 2= weak squeeze, 3= fair squeeze with lift, 4= good squeeze and lift against resistance, 5= strong squeeze against strong resistance)    TODAY'S TREATMENT  Therapeutic Exercise: Seated rows with YTB, 2x10, with coordinated breathing for improved IAP management and improved strengthening of the posterior chain   Seated lat pull downs with YTB, 2x10, with coordinated breathing for improved IAP management and improved strengthening of the posterior chain    Neuromuscular Re-education Discussion on practicing upright posture in seated without lumbar support to encourage activation of the deep core and promote deep core endurance. Pt verbalized understanding.   Seated pallof press with YTB, x10, with coordinated breathing improved IAP management and improved strengthening of the posterior chain   Discussion on continuing HEP at home and the importance of continuing to see progression of her chief concern. Pt verbalized understanding. Provided strategies in how to break up her HEP into categories (LE strength, deep core, posture/posterior chain strength) and choosing to focus on one section that day.    Patient response to interventions: Pt is comfortable to continue next few sessions but knows she has to do more of her HEP at home   Patient Education:  Patient provided with HEP: seated pallof, rows  and lat pull downs with YTB Patient educated throughout session on appropriate technique and form using multi-modal cueing, HEP, and activity modification. Patient will benefit from further education in order to maximize compliance and understanding for long-term therapeutic gains.     ASSESSMENT:  Clinical Impression: Patient presents to clinic with excellent motivation to participate in today's session. Pt  continues to demonstrate deficits in IAP management, PFM coordination, PFM strength, posture, mobility and LE strength. Pt reports urinary leakage continues to improve and only requiring to change incontinence pad in the home 1x/day. Discussion on the importance of practicing HEP outside of scheduled sessions in order to continue to notice progression with chief concern and other deficits listed. Pt verbalized understanding. Pt required moderate VCs and TCs with posterior chain strengthening and TrA progression in seated to decrease bodily compensations. Pt responded positively to active and educational interventions. Pt will continue to benefit from skilled therapeutic intervention to address deficits in IAP management, PFM coordination, PFM strength, posture, mobility and LE strength in order to increase PLOF and improve overall QOL.    Objective Impairments: Abnormal gait, decreased activity tolerance, decreased balance, decreased coordination, decreased endurance, decreased mobility, decreased strength, improper body mechanics, postural dysfunction, and pain.   Activity Limitations: lifting, bending, sitting, standing, squatting, sleeping, transfers, continence, toileting, and locomotion level  Personal Factors: Age, Behavior pattern, Past/current experiences, Time since onset of injury/illness/exacerbation, and 1-2 comorbidities: hx of breast cancer and A-fib  are also affecting patient's functional outcome.   Rehab Potential: Fair - age related changes may inhibit progression towards  long-term goals and compliance with HEP as improvements in PFM coordination/strength take time.   Clinical Decision Making: Evolving/moderate complexity  Evaluation Complexity: Moderate   GOALS: Goals reviewed with patient? Yes  SHORT TERM GOALS: Target date: 06/12/22  Patient will decrease intake of caffeinated beverages and other bladder irritants to less than/= 2 cups and report increased water hydration in order to manage urinary leakage and bladder irritation for improved overall QOL and participation at home and in the community. Baseline: unsweetened Kool-Aid with stevia (up to 3 tall cups a day); (9/7) drinking about 3 bottles of water with 1 cup of Kool-aid mixed with water Goal status: MET    LONG TERM GOALS: Target date: 08/27/22  Patient will improve score on FOTO Pelvic Floor outcome measure by at least 5-6 points in order to demonstrate improved IAP management, improved PFM coordination, and overall improved QOL.  Baseline: (7/27): 40 (target is 52); (9/7): 66 on Urinary Problem  Goal status: MET  2.  Patient will report decreased reliance on protective undergarments as indicated by a 24 hour period to demonstrate improved bladder control and allow for increased participation in activities outside of the home. Baseline: incontinence briefs 2-3x/day, (9/7): pantyliners during the day and at night/only changing 1x; (9/21): using pantyliner during day/night, incontinence brief out in the community/changing 1x Goal status: IN PROGRESS  3.  Patient will report confidence in ability to control bladder > 7/10 in order to demonstrate improved function and ability to participate more fully in activities at home and in the community. Baseline: 4/10, (9/7): 8/10 Goal status: MET  4.  Patient will participate in a 5TSTS test and demonstrate improved scores to match age-related norms (14 secs or less) to decrease risk in falls and overall balance as Pt has increased urinary leakage  and does not use AD in the home.  Baseline: (7/20): 19 seconds; (9/21):  17 seconds  Goal status: IN PROGRESS  5.  Patient will report less than 5 incidents of stress urinary incontinence over the course of 3 weeks while coughing/sneezing/laughing/prolonged activity in order to demonstrate improved PFM coordination, strength, and function for improved overall QOL. Baseline: every time with all the above; (9/7): 1-2x over past 3 weeks Goal status: MET    PLAN: PT Frequency: 1x/week  PT Duration: 12 weeks  Planned Interventions: Therapeutic exercises, Therapeutic activity, Neuromuscular re-education, Balance training, Gait training, Patient/Family education, Self Care, Joint mobilization, Spinal mobilization, Cryotherapy, Moist heat, scar mobilization, Taping, and Manual therapy  Plan For Next Session:  STSs with band, how did the bands go?   Junior Huezo, PT, DPT  07/02/2022, 11:05 AM

## 2022-07-03 ENCOUNTER — Other Ambulatory Visit: Payer: Self-pay | Admitting: Cardiovascular Disease

## 2022-07-08 NOTE — Progress Notes (Signed)
Cardiology Office Note:    Date:  07/09/2022   ID:  Brandi Scott, DOB [birth year], MRN [id number]  PCP:  Brandi Mina, MD   [facility] Providers Cardiologist:  [name], MD {   Referring MD: Brandi Mina, MD   Chief Complaint  Patient presents with   Follow-up    12 month follow up. Patient states that she is doing ok today. Meds reviewed with patient.     History of Present Illness:    Brandi Scott is a 88 y.o. female with a hx of valvular heart disease, PAF on Eliquis, frequent PVCs, HTN, and HLD. She was last seen in the office on 06/11/2021 by Dr. Mariah Milling. She was found to be tolerating Eliquis and Metoprolol well. Her BP was elevated secondary to possible nervousness over upcoming dental appointment. Plan at that time was to continue current medication and close monitoring of her BP.   Patient is seen today accompanied by her son for 30-month follow-up. She is doing well. She reports an episode of palpitations approximately 3 weeks ago while eating dinner.  She states she did a lot during the day which can trigger an episode.  This episode in particular lasted approximately 9 hours.  She states after it started she rested and then took her nightly dose of metoprolol with no relief.  She did sleep on and off through the night, which is typical for her, but did feel she have palpitations until the morning.  She she was somewhat short of breath with the episode but describes more of an anxious feeling.  By the time she woke in the morning the palpitations had resolved.  She reports she is working on better hydration.  Educated her on possibility of more palpitations if she gets dehydrated. She reports some mild edema of her lower extremities right greater than left.  She sprained her right ankle twice and since that time has noticed her right leg is slightly larger than the left.  She wears compression hose to manage asymptomatic varicose veins.  She denies chest  pain or shortness of breath at rest or with activity.  She does complain of some generalized aches and pains that are a little bit worse than normal.  She particularly complains of pain in her neck and back.  She mentioned this to her physical therapist who she is seeing for pelvic floor therapy.  Her therapist has been assisting her with exercises to help support her posture better.  She was concerned about her refill of lisinopril, as the tablets look different than what she was previously getting.  Reviewed pictures of lisinopril tablets on [employer] and reassured her she was taking the correct medications despite the pills looking different.  Encouraged her to speak with the pharmacist if she ever has questions about her medication tablets looking different.  Her BP is good today at 116/72.  She and her son inquire about receiving financial assistance for Eliquis.  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 01/2012 MV: EF 74%, no ischemia.   Hyperlipidemia    Osteoarthritis of both hips    And knees as well as back.    PAF (paroxysmal atrial fibrillation) (HCC)    a. 10/2017 Event monitor: 46 SVTs (longest 19 beats, avg  110 bpm). Afib (<1% burden w/ avg HR 142)-->Eliquis (CHA2DS2VASc = 4).   Valvular heart disease    a. 2013 Echo: Moderate MR and moderate-severe TR on echocardiogram; b. Echo 2015: mild MR & TR. Nl EF, mild LVH.   Vertigo    none in several years    Past Surgical History:  Procedure Laterality Date   BREAST EXCISIONAL BIOPSY Right 1971, 1981   negative   CATARACT EXTRACTION W/PHACO Right 03/28/2019   Procedure: CATARACT EXTRACTION PHACO AND INTRAOCULAR LENS PLACEMENT (IOC) RIGHT;  Surgeon: Brandi Manila, MD;  Location: [facility];  Service: Ophthalmology;  Laterality: Right;    COLONOSCOPY     Holter Monitor  May 2013   @ [facility] Cardiology: Occasional PVCs (benign)   NM MYOVIEW LTD  May 2013; Dec 2015   @ [facility] Cardiology: a) 2013: Exercise for 2:30 min (only), hypertensive response no ischemic changes. He has 74%, normal wall motion, no evidence of ischemia or infarction.; b) Ex 3:00 min (dyspnea) 3.3 METs HR 146 (106% MPHR) - no ECG changes, E 70%, no RWMA, No ischemia or infarction   ORIF ANKLE FRACTURE Left 09/07/2016   Procedure: OPEN REDUCTION INTERNAL FIXATION (ORIF) ANKLE FRACTURE;  Surgeon: Brandi Heinz, MD;  Location: [facility];  Service: Orthopedics;  Laterality: Left;   TONGUE BIOPSY     TONSILLECTOMY     TOTAL ABDOMINAL HYSTERECTOMY W/ BILATERAL SALPINGOOPHORECTOMY     TRANSTHORACIC ECHOCARDIOGRAM  May 2013; May 2015   @ [facility]: a) 2013 : Normal LV function, EF 65%, mild BiAE, Mild LVH, Mod MR, Mod-Severe TR; b) normal LV size, EF> 55%, mild LVH, mild MR, mild TR and    Current Medications: Current Meds  Medication Sig   apixaban (ELIQUIS) 5 MG TABS tablet Take 1 tablet (5 mg total) by mouth 2 (two) times daily.   Calcium Carb-Cholecalciferol 600-800 MG-UNIT TABS Take 1 tablet by mouth daily.   Cholecalciferol 25 MCG (1000 UT) capsule Take 2,000 Units by mouth daily.    lisinopril (ZESTRIL) 20 MG tablet Take 1 tablet by mouth once daily   metoprolol tartrate (LOPRESSOR) 50 MG tablet Take 1 tablet (50 mg total) by mouth 2 (two) times daily.   Probiotic Product (PROBIOTIC DAILY PO) Take by mouth daily.   Vibegron (GEMTESA PO) Take 75 mg by mouth.   vitamin E 400 UNIT capsule Take 400 Units by mouth daily.     Allergies:   Patient has no known allergies.   Social History   Socioeconomic History   Marital status: Widowed    Spouse name: Not on file   Number of children: Not on file   Years of education: Not on file   Highest education level: Not on file  Occupational History   Not on file  Tobacco Use    Smoking status: Never   Smokeless tobacco: Never  Vaping Use   Vaping Use: Never used  Substance and Sexual Activity   Alcohol use: No   Drug use: No   Sexual activity: Not on file  Other Topics Concern   Not on file  Social History Narrative   Not on file   Social Determinants of Health   Financial Resource Strain: Not on file  Food Insecurity: Not on file  Transportation Needs: Not on file  Physical Activity: Not on file  Stress: Not on file  Social Connections: Not on file     Family History: [name] history includes Alzheimer's disease  in her mother; Heart attack in her father; Heart disease in her brother and maternal grandmother; Heart failure in her mother; Hypertension in her father; Stomach cancer in her brother. There is no history of Breast cancer.  ROS:   Please see the history of present illness.     All other systems reviewed and are negative.  EKGs/Labs/Other Studies Reviewed:    The following studies were reviewed today: Event Monitor 11/19/2017: Study Highlights Predominant underlying rhythm was Sinus Rhythm.  Patient had a min HR of 53 bpm, max HR of 172 bpm, and avg HR of 74 bpm.   46 Supraventricular Tachycardia runs occurred,  the longest lasting 19 beats with an avg rate of 110 bpm.    Atrial Fibrillation occurred (<1% burden), ranging from 98-172 bpm (avg of 142 bpm), the longest lasting 15 mins 14 secs with an avg rate of 144 bpm. Atrial Fibrillation was detected within +/- 45 seconds of symptomatic patient event(s).    Isolated SVEs were rare (<1.0%), SVE Couplets were rare (<1.0%), and SVE Triplets were rare (<1.0%). Isolated VEs were rare (<1.0%), and no VE Couplets or VE Triplets were present. Ventricular Trigeminy was present.  EKG:  EKG is ordered today.  The ekg ordered today demonstrates sinus rhythm, heart rate 82  Recent Labs: From [facility] 05/06/22: BUN 19, Creatinine 0.9, Potassium 4.3, Sodium 138, Hemoglobin  14.3, Platelets 267 01/01/2022: BUN 23; Creatinine, Ser 1.08; Hemoglobin 13.3; Platelets 226; Potassium 3.8; Sodium 139  Recent Lipid Panel From [facility] 03/05/2022: LDL 108, HDL 55.3, TG 141   Risk Assessment/Calculations:    CHA2DS2-VASc Score = 4  This indicates a 4.8% annual risk of stroke. The patient's score is based upon: CHF History: 0 HTN History: 1 Diabetes History: 0 Stroke History: 0 Vascular Disease History: 0 Age Score: 2 Gender Score: 1         Physical Exam:    VS:  BP 116/72 (BP Location: Left Arm, Patient Position: Sitting, Cuff Size: Normal)   Pulse 82   Ht 5\' 3"  (1.6 m)   Wt 152 lb 3.2 oz (69 kg)   SpO2 94%   BMI 26.96 kg/m     Wt Readings from Last 3 Encounters:  07/09/22 152 lb 3.2 oz (69 kg)  06/11/21 148 lb (67.1 kg)  12/09/20 151 lb (68.5 kg)     GEN: Well nourished, well developed in no acute distress HEENT: Normal NECK: No JVD; No carotid bruits LYMPHATICS: No lymphadenopathy CARDIAC: RRR, no murmurs, rubs, gallops. Trace edema lower extremities right greater than left RESPIRATORY:  Clear to auscultation without rales, wheezing or rhonchi  ABDOMEN: Soft, non-tender, non-distended MUSCULOSKELETAL:  No edema; No deformity  SKIN: Warm and dry NEUROLOGIC:  Alert and oriented x 3 PSYCHIATRIC:  Normal affect   ASSESSMENT:    1. Palpitations   2. Paroxysmal atrial fibrillation (HCC)   3. Essential hypertension    PLAN:    In order of problems listed above:  Palpitations.  She states only having problems with palpitations when she is anxious or does too much physical activity.  She had one episode approximately 3 weeks ago that lasted through the night for a total of about 9 hours.  She did take her evening dose of metoprolol soon after the palpitations started but did not notice any relief.  She states at that point she was feeling somewhat anxious and had done quite a bit throughout the day.  She slept on and off throughout the night  and when she woke in the morning the palpitations had resolved.  She is instructed to contact us episodes of palpitations become more frequent.  Continue metoprolol 50 mg twice a day. PAF.  EKG done today shows sinus rhythm with a heart rate of 82.  She we will continue Eliquis 5 mg twice a day.  She and her son inquired about financial assistance.  She is provided with an application for assistance. Hypertension.  BP 116/72 today.  She will continue lisinopril 20 mg daily.  Refill for 90 day supply will be sent to her pharmacy.  Follow-up in 1 year or sooner as needed.        Medication Adjustments/Labs and Tests Ordered: Current medicines are reviewed at length with the patient today.  Concerns regarding medicines are outlined above.  Orders Placed This Encounter  Procedures   EKG 12-Lead   Meds ordered this encounter  Medications   lisinopril (ZESTRIL) 20 MG tablet    Sig: Take 1 tablet (20 mg total) by mouth daily.    Dispense:  90 tablet    Refill:  3    Patient Instructions  Medication Instructions:   Your physician recommends that you continue on your current medications as directed. Please refer to the Current Medication list given to you today.  *If you need a refill on your cardiac medications before your next appointment, please call your pharmacy*   Lab Work:  None Ordered  If you have labs (blood work) drawn today and your tests are completely normal, you will receive your results only by: MyChart Message (if you have MyChart) OR A paper copy in the mail If you have any lab test that is abnormal or we need to change your treatment, we will call you to review the results.   Testing/Procedures:  None Ordered   Follow-Up: At [facility], you and your health needs are our priority.  As part of our continuing mission to provide you with exceptional heart care, we have created designated Provider Care Teams.  These Care Teams include your primary  Cardiologist (physician) and Advanced Practice Providers (APPs -  Physician Assistants and Nurse Practitioners) who all work together to provide you with the care you need, when you need it.  We recommend signing up for the patient portal called "MyChart".  Sign up information is provided on this After Visit Summary.  MyChart is used to connect with patients for Virtual Visits (Telemedicine).  Patients are able to view lab/test results, encounter notes, upcoming appointments, etc.  Non-urgent messages can be sent to your provider as well.   To learn more about what you can do with MyChart, go to [email or web address].    Your next appointment:   12 month(s)  The format for your next appointment:   In Person  Provider:   You may see [name], MD or one of the following Advanced Practice Providers on your designated Care Team:   Nicolasa Ducking, NP Eula Listen, PA-C Cadence Fransico Michael, PA-C Charlsie Quest, NP    Signed, Carlos Levering, NP  07/09/2022 3:22 PM    [facility]

## 2022-07-09 ENCOUNTER — Ambulatory Visit: Payer: Medicare Other | Attending: Cardiovascular Disease | Admitting: Student

## 2022-07-09 ENCOUNTER — Encounter: Payer: Self-pay | Admitting: Cardiology

## 2022-07-09 ENCOUNTER — Telehealth: Payer: Self-pay

## 2022-07-09 VITALS — BP 116/72 | HR 82 | Ht 63.0 in | Wt 152.2 lb

## 2022-07-09 DIAGNOSIS — I10 Essential (primary) hypertension: Secondary | ICD-10-CM

## 2022-07-09 DIAGNOSIS — I480 Paroxysmal atrial fibrillation: Secondary | ICD-10-CM

## 2022-07-09 DIAGNOSIS — R002 Palpitations: Secondary | ICD-10-CM | POA: Diagnosis not present

## 2022-07-09 MED ORDER — LISINOPRIL 20 MG PO TABS: 20.0000 mg | ORAL_TABLET | Freq: Every day | ORAL | 3 refills | Status: DC

## 2022-07-09 NOTE — Patient Instructions (Signed)
Medication Instructions:   Your physician recommends that you continue on your current medications as directed. Please refer to the Current Medication list given to you today.  *If you need a refill on your cardiac medications before your next appointment, please call your pharmacy*   Lab Work:  None Ordered  If you have labs (blood work) drawn today and your tests are completely normal, you will receive your results only by: MyChart Message (if you have MyChart) OR A paper copy in the mail If you have any lab test that is abnormal or we need to change your treatment, we will call you to review the results.   Testing/Procedures:  None Ordered   Follow-Up: At [facility], you and your health needs are our priority.  As part of our continuing mission to provide you with exceptional heart care, we have created designated Provider Care Teams.  These Care Teams include your primary Cardiologist (physician) and Advanced Practice Providers (APPs -  Physician Assistants and Nurse Practitioners) who all work together to provide you with the care you need, when you need it.  We recommend signing up for the patient portal called "MyChart".  Sign up information is provided on this After Visit Summary.  MyChart is used to connect with patients for Virtual Visits (Telemedicine).  Patients are able to view lab/test results, encounter notes, upcoming appointments, etc.  Non-urgent messages can be sent to your provider as well.   To learn more about what you can do with MyChart, go to [email or web address].    Your next appointment:   12 month(s)  The format for your next appointment:   In Person  Provider:   You may see Timothy Gollan, MD or one of the following Advanced Practice Providers on your designated Care Team:   Christopher Berge, NP Ryan Dunn, PA-C Cadence Furth, PA-C Sheri Hammock, NP  

## 2022-07-09 NOTE — Telephone Encounter (Signed)
Patient provided a patient assistance application for Eliquis during today visit. Patient will fill out her portion and bring proof of income when it is completed.

## 2022-07-16 ENCOUNTER — Ambulatory Visit: Payer: Medicare Other

## 2022-07-16 DIAGNOSIS — R278 Other lack of coordination: Secondary | ICD-10-CM | POA: Diagnosis not present

## 2022-07-16 DIAGNOSIS — R293 Abnormal posture: Secondary | ICD-10-CM

## 2022-07-16 DIAGNOSIS — M6289 Other specified disorders of muscle: Secondary | ICD-10-CM

## 2022-07-16 DIAGNOSIS — M6281 Muscle weakness (generalized): Secondary | ICD-10-CM

## 2022-07-16 NOTE — Therapy (Signed)
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 01/2012 MV: EF 74%, no ischemia.   Hyperlipidemia    Osteoarthritis of both hips    And knees as well as back.    PAF (paroxysmal atrial fibrillation) ([place])    a. 10/2017 Event monitor: 46 SVTs (longest 19 beats, avg 110 bpm). Afib (<1% burden w/ avg HR 142)-->Eliquis (CHA2DS2VASc = 4).   Valvular heart disease    a. 2013 Echo: Moderate MR and moderate-severe TR on echocardiogram; b. Echo 2015: mild MR & TR. Nl EF, mild LVH.   Vertigo    none in several years   Past Surgical History:  Procedure Laterality Date   BREAST EXCISIONAL BIOPSY Right 1971, 1981   negative   CATARACT EXTRACTION W/PHACO Right 03/28/2019   Procedure: CATARACT EXTRACTION PHACO AND INTRAOCULAR LENS PLACEMENT ([place]) RIGHT;  Surgeon: Birder Robson, MD;  Location: [facility];  Service: Ophthalmology;  Laterality: Right;   COLONOSCOPY     Holter Monitor  May 2013   @ [facility]: Occasional PVCs (benign)   NM MYOVIEW LTD  May 2013; Dec 2015   @ [facility]: a) 2013: Exercise for 2:30 min (only), hypertensive response no ischemic changes. He has 74%, normal wall motion, no evidence of ischemia or infarction.; b) Ex 3:00 min (dyspnea) 3.3 METs HR 146 (106% MPHR) - no ECG changes, E 70%,  no RWMA, No ischemia or infarction   ORIF ANKLE FRACTURE Left 09/07/2016   Procedure: OPEN REDUCTION INTERNAL FIXATION (ORIF) ANKLE FRACTURE;  Surgeon: Dereck Leep, MD;  Location: [facility];  Service: Orthopedics;  Laterality: Left;   TONGUE BIOPSY     TONSILLECTOMY     TOTAL ABDOMINAL HYSTERECTOMY W/ BILATERAL SALPINGOOPHORECTOMY     TRANSTHORACIC ECHOCARDIOGRAM  May 2013; May 2015   @ [facility] Card[facility]: a) 2013 : Normal LV function, EF 65%, mild BiAE, Mild LVH, Mod MR, Mod-Severe TR; b) normal LV size, EF> 55%, mild LVH, mild MR, mild TR and   Patient Active Problem List   Diagnosis Date Noted   Paroxysmal atrial fibrillation ([place]) 12/30/2017   Paroxysmal tachycardia ([place]) 11/19/2017   Palpitations 11/19/2017   Dizziness 11/19/2017   Ankle fracture, bimalleolar, closed, left, initial encounter 09/07/2016   Tachycardia palpitations 12/14/2014   Frequent PVCs 12/14/2014   Asymptomatic varicose veins of bilateral lower extremities: With mild edema 12/14/2014   Essential hypertension 12/14/2014   Mild mitral and tricuspid regurgitation on recent echo     PCP: [place] Pink, MD  REFERRING PROVIDER: Royston Cowper, MD   REFERRING DIAG:  N39.41 (ICD-10-CM) - Urge incontinence  N39.3 (ICD-10-CM) - Stress incontinence (female) (female)  THERAPY DIAG:  Other lack of coordination  Muscle weakness (generalized)  Abnormal posture  Pelvic floor dysfunction  Rationale for Evaluation and Treatment: Rehabilitation  ONSET DATE: 2-3 years but worse about a month ago  PRECAUTIONS: None  WEIGHT BEARING RESTRICTIONS: No  FALLS:  Has patient fallen in last 6 months? No  OCCUPATION/SOCIAL ACTIVITIES: cooking, household chores, walking, would like to walk outdoors or in the mall more  PLOF: Independent with basic ADLs, Independent with household mobility without device, and Independent with community mobility with device   CHIEF CONCERN: Pt arrived with quad cane. Pt  sprained R ankle twice when she was younger and ever since has had problems with her R leg. Pt describes it as the R leg feels unstable at times. Pt only uses the quad cane when outside the home. Pt does have a seat in the shower and is IND with ADLs and mobility. Pt has noticed urinary leakage after sitting/lying down for prolonged periods and then goes to stand up. Pt had a cortisone shot in the R shoulder and feels her urinary leakage has been worse since then (a month ago). The Dr told the Pt to drink more water and she has been but it causes more leakage. Pt reports not feeling a sensation of needing to go to the bathroom, but she makes herself go because she assumes her bladder is full. Pt has been wearing pantyliners for many years and did not mind that but now has to wear briefs due to the amount of leakage. Pt also notices leakage with sneezing/coughing/prolonged activity/lifting.     PATIENT GOALS: Wants to be able to feel the urge to go to the bathroom, not leak as much or wear a brief (return to pantyliners)   UROLOGICAL HISTORY Fluid intake: Yes: water (up to 3 12 oz), unsweetened Kool-Aid with Stevia (2-3 tall cups/day)   Pain with urination: No Fully empty bladder: Yes but after she uses the bathroom, she has more leakage even though she just emptied  Stream: Weak, constant  Urgency: No  Toileting posture: feet flat Frequency: 6x "Just in case": yes Nocturia: 3x but feels little urge, she makes herself go Leakage: Urge to void, Walking to the bathroom, Coughing, Sneezing, Lifting, and Bending forward Pads: Yes Type: briefs Amount: 2-3x/day Bladder control (0-10): 4/10  GASTROINTESTINAL HISTORY Pain with bowel movement: No Type of bowel movement:Type (Bristol Stool Scale) 1 and 4 and Strain Yes Frequency: 1x/day Fully empty rectum: Yes   SEXUAL HISTORY/FUNCTION Pt has no concerns    OBSTETRICAL HISTORY- G0P0    GYNECOLOGICAL HISTORY Hysterectomy: yes, abdominal  hysterectomy  Pelvic Organ Prolapse: None Pain with exam:  Heaviness/pressure:  SUBJECTIVE:  Pt has been doing well over the past 2 weeks. Pt feels that she has made much time for HEP but continues to see an improvement from IE.    PAIN:  Are you having pain? No     OBJECTIVE:    COGNITION: Overall cognitive status: Within functional limits for tasks assessed     POSTURE: (04/23/22)  Lumbar lordosis: decreased in standing  Thoracic kyphosis: significant in standing and sitting  Iliac crest - WNL Pelvic obliquity - R slightly posteriorly rotated  Increased R shoulder elevation    GAIT:  Distance walked: 100 ft Assistive device utilized: quad cane Level of assistance: CGA  Comments:  Pt feels at times her R knee wants to give out with the quad cane. Pt also felt urinary leakage while walking.   SENSATION:  Deferred 2/2 time constraints  Light touch: , L2-S2 dermatomes  Proprioception:    RANGE OF MOTION:    (Norm range in degrees)  LEFT 04/16/22 RIGHT 04/16/22  Lumbar forward flexion (65):  WNL    Lumbar extension (30): Restricted    Lumbar lateral flexion (25):  WNL WNL  Thoracic and Lumbar rotation (30 degrees):    WNL WNL  Hip Flexion (0-125):   WNL WNL  Hip IR (0-45):  WNL WNL  Hip ER (0-45):  WNL WNL  Hip Adduction:      Hip Abduction (0-40):  WNL WNL  Hip extension (0-15):     (*= pain, Blank rows = not tested)   STRENGTH: MMT    RLE 04/16/22 LLE 04/16/22  Hip Flexion 4 (backward lean) 4 (backward lean)  Hip Extension 4 4  Hip Abduction     Hip Adduction     Hip ER  5 5  Hip IR  5 5  Knee Extension 5 5  Knee Flexion 4 4  Dorsiflexion     Plantarflexion (seated) 5 5  (*= pain, Blank rows = not tested)   SPECIAL TESTS:  FABER (SN 81): negative B FADIR (SN 94): negative B   PHYSICAL PERFORMANCE MEASURES:   5TSTS: 19 seconds    PALPATION: Abdominal:  Diastasis:  none Rib flare: present B  EXTERNAL PELVIC EXAM: Patient educated on  the purpose of the pelvic exam and articulated understanding; patient consented to the exam verbally.  Breath coordination: inconsistent Voluntary Contraction: present, 2/5 MMT with adductor and abdominal activation  Relaxation: delayed Perineal movement with sustained IAP increase ("bear down"): descent, with significant cueing for technique, Valsalva noted Perineal movement with rapid IAP increase ("cough"): no change  (0= no contraction, 1= flicker, 2= weak squeeze, 3= fair squeeze with lift, 4= good squeeze and lift against resistance, 5= strong squeeze against strong resistance)    TODAY'S TREATMENT  Neuromuscular Re-education Discussion on continuing HEP - diaphragmatic breathing in sitting, basic deep core and LE deep core, and lifting mechanics with breath.  Modifying seated pallof press  Review of UE strengthening - rows and lat pull downs   5TSTS Reassessment:  17 seconds - no change from 9/21   Patient response to interventions: Pt comfortable to discharge.    Patient Education:  Patient provided with HEP: no change. Patient educated throughout session on appropriate technique and form using multi-modal cueing, HEP, and activity modification. Patient will benefit from further education in order to maximize compliance and understanding for long-term therapeutic gains.     ASSESSMENT:  Clinical Impression: Patient presents to clinic with excellent motivation to participate in today's discharge. Per discussion with DPT, Pt reports ready to be discharged as she has progressed significantly since IE on 04/09/22 but has trouble finding time for HEP outside of scheduled sessions. DPT agreed that prioritizing HEP outside sessions is important to continue seeing progress. Pt had a few questions in regards to HEP and reviewed with Pt requiring minimal cueing. Upon reassessment of 5TSTS, Pt continues to demonstrate an increased risk for falls and no change since re-eval in September  (17 seconds). Although, Pt would continue to benefit from PFPT due to remaining deficits, Pt has progressed tremendously. Pt now uses pantyliners in the home mostly for "just in case" as Pt notices very minimal leakage but continues to wear briefs in the community to be safe. Pt has been able to decrease intake of bladder irritants and has increased confidence in her bladder  since IE (4/10 to 8/10). Pt is adequate for discharge and given rehab services business card to contact us if further complications arise.     Objective Impairments: Abnormal gait, decreased activity tolerance, decreased balance, decreased coordination, decreased endurance, decreased mobility, decreased strength, improper body mechanics, postural dysfunction, and pain.   Activity Limitations: lifting, bending, sitting, standing, squatting, sleeping, transfers, continence, toileting, and locomotion level  Personal Factors: Age, Behavior pattern, Past/current experiences, Time since onset of injury/illness/exacerbation, and 1-2 comorbidities: hx of breast cancer and A-fib  are also affecting patient's functional outcome.   Rehab Potential: Fair - age related changes may inhibit progression towards long-term goals and compliance with HEP as improvements in PFM coordination/strength take time.   Clinical Decision Making: Evolving/moderate complexity  Evaluation Complexity: Moderate   GOALS: Goals reviewed with patient? Yes  SHORT TERM GOALS: Target date: 06/12/22  Patient will decrease intake of caffeinated beverages and other bladder irritants to less than/= 2 cups and report increased water hydration in order to manage urinary leakage and bladder irritation for improved overall QOL and participation at home and in the community. Baseline: unsweetened Kool-Aid with stevia (up to 3 tall cups a day); (9/7) drinking about 3 bottles of water with 1 cup of Kool-aid mixed with water Goal status: MET    LONG TERM GOALS: Target  date: 08/27/22  Patient will improve score on FOTO Pelvic Floor outcome measure by at least 5-6 points in order to demonstrate improved IAP management, improved PFM coordination, and overall improved QOL.  Baseline: (7/27): 40 (target is 52); (9/7): 66 on Urinary Problem  Goal status: MET  2.  Patient will report decreased reliance on protective undergarments as indicated by a 24 hour period to demonstrate improved bladder control and allow for increased participation in activities outside of the home. Baseline: incontinence briefs 2-3x/day, (9/7): pantyliners during the day and at night/only changing 1x; (9/21): using pantyliner during day/night, incontinence brief out in the community/changing 1x Goal status: NOT MET  3.  Patient will report confidence in ability to control bladder > 7/10 in order to demonstrate improved function and ability to participate more fully in activities at home and in the community. Baseline: 4/10, (9/7): 8/10 Goal status: MET  4.  Patient will participate in a 5TSTS test and demonstrate improved scores to match age-related norms (14 secs or less) to decrease risk in falls and overall balance as Pt has increased urinary leakage and does not use AD in the home.  Baseline: (7/20): 19 seconds; (9/21):  17 seconds; (10/19): 17 seconds Goal status: NOT MET  5.  Patient will report less than 5 incidents of stress urinary incontinence over the course of 3 weeks while coughing/sneezing/laughing/prolonged activity in order to demonstrate improved PFM coordination, strength, and function for improved overall QOL. Baseline: every time with all the above; (9/7): 1-2x over past 3 weeks Goal status: MET    PLAN: PT Frequency: 1x/week  PT Duration: 12 weeks  Planned Interventions: Therapeutic exercises, Therapeutic activity, Neuromuscular re-education, Balance training, Gait training, Patient/Family education, Self Care, Joint mobilization, Spinal mobilization,  Cryotherapy, Moist heat, scar mobilization, Taping, and Manual therapy    Linsey Arteaga, PT, DPT  07/16/2022, 12:38 PM

## 2022-07-23 ENCOUNTER — Other Ambulatory Visit: Payer: Self-pay | Admitting: *Deleted

## 2022-07-23 DIAGNOSIS — I480 Paroxysmal atrial fibrillation: Secondary | ICD-10-CM

## 2022-07-23 MED ORDER — APIXABAN 5 MG PO TABS: 5.0000 mg | ORAL_TABLET | Freq: Two times a day (BID) | ORAL | 3 refills | Status: DC

## 2022-07-31 NOTE — Telephone Encounter (Signed)
Letter received from [employer] informing that this patient has been approved for Eliquis assistance from 07/28/2022 to 09/27/2022. Letter filed 

## 2022-08-11 ENCOUNTER — Other Ambulatory Visit: Payer: Self-pay | Admitting: Cardiovascular Disease

## 2022-08-27 ENCOUNTER — Telehealth: Payer: Self-pay | Admitting: Cardiovascular Disease

## 2022-08-27 DIAGNOSIS — I480 Paroxysmal atrial fibrillation: Secondary | ICD-10-CM

## 2022-08-27 NOTE — Telephone Encounter (Signed)
Patient dropped off PAF gave to [facility], please call back to confirm all is good.

## 2022-09-02 NOTE — Telephone Encounter (Signed)
  Patient has already been approved. I will call to let her know that for next year she will need updated documentation.

## 2022-09-02 NOTE — Telephone Encounter (Signed)
Follow Up:     Nephew is calling back to see if anything was needed for application for patient assistance for patient's medicine?

## 2022-09-02 NOTE — Telephone Encounter (Signed)
Reviewed that patient was already eligible for this year and he said this was for next year. Reviewed that she would need to spend 3% of her income on medications and in review of the records he brought in it appears that she may qualify. Advised that I will finish completing these forms and get them submitted if not this week then next. He verbalized understanding with no further questions at this time.

## 2022-09-07 MED ORDER — APIXABAN 5 MG PO TABS: 5.0000 mg | ORAL_TABLET | Freq: Two times a day (BID) | ORAL | 3 refills | Status: DC

## 2022-09-07 NOTE — Telephone Encounter (Signed)
Application completed, faxed, and filed.  

## 2022-10-27 ENCOUNTER — Telehealth: Payer: Self-pay | Admitting: Cardiovascular Disease

## 2022-10-27 NOTE — Telephone Encounter (Signed)
Pt states that she needs call back to discuss patient assistance form that was filled out and sent in. She states that it was never approved due to the date on paperwork not being completed. Patient states she will run out of her medication, Eliquis, next week and would also like to talk about this. Please advise.

## 2022-10-27 NOTE — Telephone Encounter (Signed)
Spoke with patient to review her application we have on file. Application is dated from October 2023. No other application was found. Discussed requirements of spending 3% out of pocket on her prescriptions and will need updated income statement. She thought that her forms were brought by the office. I have looked for these forms and again only find the ones from 2023. Advised I would research this further and give her a call back. She was appreciative with no further questions at this time.

## 2022-10-28 ENCOUNTER — Telehealth: Payer: Self-pay | Admitting: Cardiovascular Disease

## 2022-10-28 MED ORDER — APIXABAN 5 MG PO TABS: 5.0000 mg | ORAL_TABLET | Freq: Two times a day (BID) | ORAL | 1 refills | Status: DC

## 2022-10-28 NOTE — Telephone Encounter (Signed)
Prescription refill request for Eliquis received. Indication: PAF Last office visit: 07/09/22  D Whittenborn NP Scr: 0.9 on 05/06/22 Age: 89 Weight: 69kg  Based on above findings Eliquis 5mg  twice daily is the appropriate dose.  Refill approved.

## 2022-10-28 NOTE — Telephone Encounter (Signed)
*  STAT* If patient is at the pharmacy, call can be transferred to refill team.   1. Which medications need to be refilled? (please list name of each medication and dose if known) apixaban (ELIQUIS) 5 MG TABS tablet  2. Which pharmacy/location (including street and city if local pharmacy) is medication to be sent to? [id number] - [place], [place] - [address]    3. Do they need a 30 day or 90 day supply?  30 day supply 

## 2022-10-28 NOTE — Telephone Encounter (Signed)
Refill request

## 2022-10-28 NOTE — Telephone Encounter (Signed)
After review of application on file it was noted that application was for 0383. Advised that this company requires new applications every year. Discussed out of pocket expenses and other needed documents. Instructed her to give [place] a call back once she has met her 3% and we will be happy to assist. She verbalized understanding with no further questions at this time.

## 2022-10-29 NOTE — Telephone Encounter (Signed)
Patient states that when she called they took care of things. She just needed refill sent in for her Eliquis to her pharmacy. Confirmed that they did send it in and to please let us know if she should have any further needs or concerns. She verbalized understanding with no further questions at this time.

## 2022-10-29 NOTE — Telephone Encounter (Signed)
Secure chat message received to call patient back. Will reach back out to patient to answer any further questions.

## 2022-11-11 ENCOUNTER — Telehealth: Payer: Self-pay | Admitting: Cardiovascular Disease

## 2022-11-11 NOTE — Telephone Encounter (Signed)
Fax received from [employer] stating the patient has been approved for assistance for Eliquis from 11/10/22- 09/28/23.  [employer] has mailed the patient a letter of notification as well.   Letter of confirmation has been placed in the file cabinet for completed applications.

## 2023-05-05 ENCOUNTER — Other Ambulatory Visit: Payer: Self-pay | Admitting: Cardiovascular Disease

## 2023-06-03 ENCOUNTER — Ambulatory Visit: Payer: Self-pay | Admitting: Urology

## 2023-07-01 ENCOUNTER — Ambulatory Visit: Payer: Self-pay | Admitting: Urology

## 2023-07-06 ENCOUNTER — Emergency Department
Admission: EM | Admit: 2023-07-06 | Discharge: 2023-07-06 | Disposition: A | Discharge status: Home / Self Care | Payer: Medicare Other | Attending: Emergency Medicine | Admitting: Emergency Medicine

## 2023-07-06 ENCOUNTER — Emergency Department: Payer: Medicare Other

## 2023-07-06 ENCOUNTER — Other Ambulatory Visit: Payer: Self-pay

## 2023-07-06 DIAGNOSIS — I471 Supraventricular tachycardia, unspecified: Secondary | ICD-10-CM | POA: Insufficient documentation

## 2023-07-06 DIAGNOSIS — D72829 Elevated white blood cell count, unspecified: Secondary | ICD-10-CM | POA: Diagnosis not present

## 2023-07-06 DIAGNOSIS — R197 Diarrhea, unspecified: Secondary | ICD-10-CM | POA: Diagnosis present

## 2023-07-06 DIAGNOSIS — Z7901 Long term (current) use of anticoagulants: Secondary | ICD-10-CM | POA: Diagnosis not present

## 2023-07-06 DIAGNOSIS — I4891 Unspecified atrial fibrillation: Secondary | ICD-10-CM | POA: Insufficient documentation

## 2023-07-06 LAB — CBC
HCT: 43.5 % (ref 36.0–46.0)
Hemoglobin: 14.5 g/dL (ref 12.0–15.0)
MCH: 30.1 pg (ref 26.0–34.0)
MCHC: 33.3 g/dL (ref 30.0–36.0)
MCV: 90.4 fL (ref 80.0–100.0)
Platelets: 343 10*3/uL (ref 150–400)
RBC: 4.81 MIL/uL (ref 3.87–5.11)
RDW: 12.3 % (ref 11.5–15.5)
WBC: 11.9 10*3/uL — ABNORMAL HIGH (ref 4.0–10.5)
nRBC: 0 % (ref 0.0–0.2)

## 2023-07-06 LAB — BASIC METABOLIC PANEL
Anion gap: 10 (ref 5–15)
BUN: 23 mg/dL (ref 8–23)
CO2: 22 mmol/L (ref 22–32)
Calcium: 9.5 mg/dL (ref 8.9–10.3)
Chloride: 104 mmol/L (ref 98–111)
Creatinine, Ser: 0.93 mg/dL (ref 0.44–1.00)
GFR, Estimated: 58 mL/min — ABNORMAL LOW (ref >60)
Glucose, Bld: 128 mg/dL — ABNORMAL HIGH (ref 70–99)
Potassium: 4 mmol/L (ref 3.5–5.1)
Sodium: 136 mmol/L (ref 135–145)

## 2023-07-06 LAB — MAGNESIUM: Magnesium: 1.9 mg/dL (ref 1.7–2.4)

## 2023-07-06 MED ORDER — METOPROLOL TARTRATE 50 MG PO TABS
50.0000 mg | ORAL_TABLET | Freq: Once | ORAL | Status: AC
  Administered 2023-07-06: 50 mg via ORAL
  Filled 2023-07-06: qty 1

## 2023-07-06 MED ORDER — ADENOSINE 12 MG/4ML IV SOLN
INTRAVENOUS | Status: AC
  Administered 2023-07-06: 6 mg via INTRAVENOUS
  Filled 2023-07-06: qty 4

## 2023-07-06 MED ORDER — ADENOSINE 6 MG/2ML IV SOLN
6.0000 mg | Freq: Once | INTRAVENOUS | Status: AC
  Filled 2023-07-06: qty 2

## 2023-07-06 NOTE — ED Triage Notes (Signed)
Pt presents to ED with c/o of having diarrhea after taking Augmentin for a sinus infection. Pt states HX of a-fib. NAD noted at this time. Pt denies CP.

## 2023-07-06 NOTE — ED Provider Notes (Signed)
[facility] Provider Note    Event Date/Time   First MD Initiated Contact with Patient 07/06/23 1653     (approximate)   History   Chief Complaint Diarrhea   HPI  Brandi Scott is a 89 y.o. female with past medical history of hypertension and atrial fibrillation on Eliquis who presents to the ED complaining of diarrhea.  Patient reports that she has had 3 days of watery diarrhea after being started on Augmentin for a sinus infection.  She denies any fevers or abdominal pain, does report some mild nausea but has not vomited.  She had about 30 minutes of palpitations yesterday she where she felt like her heart was racing with some mild difficulty breathing and tightness in her chest.  She describes this as similar to prior episodes of atrial fibrillation and it seemed to resolve on its own.  She developed similar symptoms this afternoon that have not yet gone away, prompting her visit to the ED.     Physical Exam   Triage Vital Signs: ED Triage Vitals  Encounter Vitals Group     BP 07/06/23 1643 116/85     Systolic BP Percentile --      Diastolic BP Percentile --      Pulse Rate 07/06/23 1643 (!) 150     Resp 07/06/23 1643 20     Temp 07/06/23 1643 98.2 F (36.8 C)     Temp src --      SpO2 07/06/23 1643 97 %     Weight 07/06/23 1644 152 lb 1.9 oz (69 kg)     Height 07/06/23 1644 5\' 3"  (1.6 m)     Head Circumference --      Peak Flow --      Pain Score 07/06/23 1644 0     Pain Loc --      Pain Education --      Exclude from Growth Chart --     Most recent vital signs: Vitals:   07/06/23 1710 07/06/23 1800  BP: (!) 153/104 (!) 141/86  Pulse: 96 84  Resp: 17 (!) 26  Temp:    SpO2: 97% 96%    Constitutional: Alert and oriented. Eyes: Conjunctivae are normal. Head: Atraumatic. Nose: No congestion/rhinnorhea. Mouth/Throat: Mucous membranes are moist.  Cardiovascular: Tachycardic, regular rhythm. Grossly normal heart sounds.  2+ radial  pulses bilaterally. Respiratory: Normal respiratory effort.  No retractions. Lungs CTAB. Gastrointestinal: Soft and nontender. No distention. Musculoskeletal: No lower extremity tenderness nor edema.  Neurologic:  Normal speech and language. No gross focal neurologic deficits are appreciated.    ED Results / Procedures / Treatments   Labs (all labs ordered are listed, but only abnormal results are displayed) Labs Reviewed  BASIC METABOLIC PANEL - Abnormal; Notable for the following components:      Result Value   Glucose, Bld 128 (*)    GFR, Estimated 58 (*)    All other components within normal limits  CBC - Abnormal; Notable for the following components:   WBC 11.9 (*)    All other components within normal limits  MAGNESIUM     EKG  ED ECG REPORT I, Chesley Noon, the attending physician, personally viewed and interpreted this ECG.   Date: 07/06/2023  EKG Time: 16:45  Rate: 151  Rhythm: SVT  Axis: Normal  Intervals:none  ST&T Change: None  ED ECG REPORT I, Chesley Noon, the attending physician, personally viewed and interpreted this ECG.   Date: 07/06/2023  EKG Time: 17:09  Rate: 102  Rhythm: sinus tachycardia  Axis: Normal  Intervals:none  ST&T Change: None   RADIOLOGY Chest x-ray reviewed and interpreted by me with no infiltrate, edema, or effusion.  PROCEDURES:  Critical Care performed: No  Procedures   MEDICATIONS ORDERED IN ED: Medications  adenosine (ADENOCARD) 6 MG/2ML injection 6 mg (6 mg Intravenous Given 07/06/23 1707)  metoprolol tartrate (LOPRESSOR) tablet 50 mg (50 mg Oral Given 07/06/23 1720)     IMPRESSION / MDM / ASSESSMENT AND PLAN / ED COURSE  I reviewed the triage vital signs and the nursing notes.                              89 y.o. female with past medical history of hypertension and atrial fibrillation on Eliquis who presents to the ED complaining of 3 days of diarrhea with palpitations intermittently since yesterday  afternoon.  Patient's presentation is most consistent with acute presentation with potential threat to life or bodily function.  Differential diagnosis includes, but is not limited to, dehydration, electrolyte abnormality, AKI, gastroenteritis, medication effect, atrial fibrillation, SVT, ACS.  Patient nontoxic-appearing and in no acute distress, vital signs remarkable for significant tachycardia but otherwise reassuring.  EKG consistent with SVT, no ischemic changes noted.  Vagal maneuver attempted with no change in rhythm, patient given 6 mg of IV adenosine with conversion to normal sinus rhythm.  She remains tachycardic but repeat EKG shows no ischemic changes, we will give her evening dose of metoprolol.  Labs are pending at this time, patient with no ongoing chest pain, shortness of breath, or palpitations and I doubt ACS or PE.  Chest x-ray is unremarkable, labs are reassuring with no significant anemia, leukocytosis, tract abnormality, or AKI.  Magnesium level also within normal limits, heart rate gradually improving following dose of metoprolol and IV fluids.  Patient asymptomatic at this time and appropriate for discharge home with outpatient cardiology follow-up.  She was counseled to stop Augmentin as this could be contributing to her diarrhea.  She was counseled to return to the ED for new or worsening symptoms, patient agrees with plan.      FINAL CLINICAL IMPRESSION(S) / ED DIAGNOSES   Final diagnoses:  SVT (supraventricular tachycardia) (HCC)  Diarrhea, unspecified type     Rx / DC Orders   ED Discharge Orders          Ordered    Ambulatory referral to Cardiology        07/06/23 1909             Note:  This document was prepared using Dragon voice recognition software and may include unintentional dictation errors.   Chesley Noon, MD 07/06/23 1910

## 2023-07-06 NOTE — ED Notes (Signed)
Pt placed on zoll pads upon arrival to room 13

## 2023-07-06 NOTE — ED Notes (Signed)
..  The patient is A&OX4, ambulatory at d/c with independent steady gait, NAD. Pt verbalized understanding of d/c instructions and follow up care.

## 2023-07-06 NOTE — ED Notes (Addendum)
error 

## 2023-07-21 ENCOUNTER — Other Ambulatory Visit: Payer: Self-pay

## 2023-07-21 MED ORDER — APIXABAN 5 MG PO TABS: 5.0000 mg | ORAL_TABLET | Freq: Two times a day (BID) | ORAL | 1 refills | Status: DC

## 2023-07-21 NOTE — Telephone Encounter (Signed)
Prescription refill request for Eliquis received. Indication:afib Last office visit:upcoming Scr:0.93  10/24 Age: 89 Weight:69  kg  Prescription refilled

## 2023-07-24 ENCOUNTER — Emergency Department
Admission: EM | Admit: 2023-07-24 | Discharge: 2023-07-24 | Disposition: A | Discharge status: Home / Self Care | Payer: Medicare Other | Attending: Student in an Organized Health Care Education/Training Program | Admitting: Student in an Organized Health Care Education/Training Program

## 2023-07-24 ENCOUNTER — Other Ambulatory Visit: Payer: Self-pay

## 2023-07-24 DIAGNOSIS — Z7901 Long term (current) use of anticoagulants: Secondary | ICD-10-CM | POA: Insufficient documentation

## 2023-07-24 DIAGNOSIS — R0602 Shortness of breath: Secondary | ICD-10-CM | POA: Diagnosis present

## 2023-07-24 DIAGNOSIS — I471 Supraventricular tachycardia, unspecified: Secondary | ICD-10-CM | POA: Diagnosis not present

## 2023-07-24 LAB — CBC WITH DIFFERENTIAL/PLATELET
Abs Immature Granulocytes: 0.02 10*3/uL (ref 0.00–0.07)
Basophils Absolute: 0.1 10*3/uL (ref 0.0–0.1)
Basophils Relative: 1 %
Eosinophils Absolute: 0.1 10*3/uL (ref 0.0–0.5)
Eosinophils Relative: 1 %
HCT: 44.6 % (ref 36.0–46.0)
Hemoglobin: 14.6 g/dL (ref 12.0–15.0)
Immature Granulocytes: 0 %
Lymphocytes Relative: 17 %
Lymphs Abs: 1.4 10*3/uL (ref 0.7–4.0)
MCH: 30.2 pg (ref 26.0–34.0)
MCHC: 32.7 g/dL (ref 30.0–36.0)
MCV: 92.1 fL (ref 80.0–100.0)
Monocytes Absolute: 0.8 10*3/uL (ref 0.1–1.0)
Monocytes Relative: 9 %
Neutro Abs: 6 10*3/uL (ref 1.7–7.7)
Neutrophils Relative %: 72 %
Platelets: 276 10*3/uL (ref 150–400)
RBC: 4.84 MIL/uL (ref 3.87–5.11)
RDW: 12.4 % (ref 11.5–15.5)
WBC: 8.3 10*3/uL (ref 4.0–10.5)
nRBC: 0 % (ref 0.0–0.2)

## 2023-07-24 LAB — BASIC METABOLIC PANEL
Anion gap: 10 (ref 5–15)
BUN: 20 mg/dL (ref 8–23)
CO2: 19 mmol/L — ABNORMAL LOW (ref 22–32)
Calcium: 9.2 mg/dL (ref 8.9–10.3)
Chloride: 104 mmol/L (ref 98–111)
Creatinine, Ser: 0.93 mg/dL (ref 0.44–1.00)
GFR, Estimated: 58 mL/min — ABNORMAL LOW (ref >60)
Glucose, Bld: 125 mg/dL — ABNORMAL HIGH (ref 70–99)
Potassium: 3.8 mmol/L (ref 3.5–5.1)
Sodium: 133 mmol/L — ABNORMAL LOW (ref 135–145)

## 2023-07-24 LAB — TROPONIN I (HIGH SENSITIVITY): Troponin I (High Sensitivity): 11 ng/L (ref <18)

## 2023-07-24 LAB — MAGNESIUM: Magnesium: 1.9 mg/dL (ref 1.7–2.4)

## 2023-07-24 MED ORDER — METOPROLOL TARTRATE 50 MG PO TABS
50.0000 mg | ORAL_TABLET | Freq: Once | ORAL | Status: AC
  Administered 2023-07-24: 50 mg via ORAL
  Filled 2023-07-24: qty 1

## 2023-07-24 MED ORDER — DILTIAZEM HCL ER COATED BEADS 120 MG PO CP24: 120.0000 mg | ORAL_CAPSULE | Freq: Every day | ORAL | 11 refills | Status: DC

## 2023-07-24 MED ORDER — DILTIAZEM HCL ER COATED BEADS 120 MG PO CP24
120.0000 mg | ORAL_CAPSULE | Freq: Once | ORAL | Status: AC
  Administered 2023-07-24: 120 mg via ORAL
  Filled 2023-07-24: qty 1

## 2023-07-24 MED ORDER — ADENOSINE 6 MG/2ML IV SOLN
6.0000 mg | Freq: Once | INTRAVENOUS | Status: AC
  Administered 2023-07-24: 6 mg via INTRAVENOUS

## 2023-07-24 MED ORDER — METOPROLOL TARTRATE 5 MG/5ML IV SOLN
5.0000 mg | Freq: Once | INTRAVENOUS | Status: AC
  Administered 2023-07-24: 5 mg via INTRAVENOUS

## 2023-07-24 NOTE — Discharge Instructions (Signed)
Start taking the new medication, Cardizem (diltiazem) every morning on Monday.

## 2023-07-24 NOTE — ED Notes (Signed)
Messaged pharmacy requesting Cardizem .

## 2023-07-24 NOTE — ED Triage Notes (Signed)
Pt via POV from home. Pt c/o fatigue, sob, weakness, and palpitations since around 0900. States that her doctor did tell her to take an extra metoprolol but she reports she thought it was too close to time. Pt has a hx of SVT and has been taking prescribed medication. On arrival, pt HR was 150s. Dr. Roxan Hockey EDP at bedside at this time. Pt is A&Ox4 and NAD

## 2023-07-24 NOTE — ED Provider Notes (Signed)
[facility] Provider Note    Event Date/Time   First MD Initiated Contact with Patient 07/24/23 1844     (approximate)   History   SVT   HPI  Brandi Scott is a 89 y.o. female presents the ER for evaluation of weakness palpitations shortness of breath starting earlier today after taking her metoprolol does have a history of A-fib with RVR had recent visit to the ER for similar symptoms found to be in SVT converting with sick milligram of adenosine.  States that she has been compliant with her medications including her Eliquis.  Denies any fevers or chills.     Physical Exam   Triage Vital Signs: ED Triage Vitals  Encounter Vitals Group     BP 07/24/23 1846 (!) 124/101     Systolic BP Percentile --      Diastolic BP Percentile --      Pulse Rate 07/24/23 1846 (!) 125     Resp 07/24/23 1849 (!) 21     Temp --      Temp src --      SpO2 07/24/23 1846 95 %     Weight 07/24/23 1844 149 lb (67.6 kg)     Height 07/24/23 1844 5\' 3"  (1.6 m)     Head Circumference --      Peak Flow --      Pain Score 07/24/23 1844 0     Pain Loc --      Pain Education --      Exclude from Growth Chart --     Most recent vital signs: Vitals:   07/24/23 1920 07/24/23 1930  BP: (!) 138/106 129/83  Pulse: 80 81  Resp:  18  SpO2:  93%     Constitutional: Alert  Eyes: Conjunctivae are normal.  Head: Atraumatic. Nose: No congestion/rhinnorhea. Mouth/Throat: Mucous membranes are moist.   Neck: Painless ROM.  Cardiovascular:   Good peripheral circulation.  Tachycardic but well-perfused. Respiratory: Normal respiratory effort.  No retractions.  Gastrointestinal: Soft and nontender.  Musculoskeletal:  no deformity Neurologic:  MAE spontaneously. No gross focal neurologic deficits are appreciated.  Skin:  Skin is warm, dry and intact. No rash noted. Psychiatric: Mood and affect are normal. Speech and behavior are normal.    ED Results / Procedures /  Treatments   Labs (all labs ordered are listed, but only abnormal results are displayed) Labs Reviewed  BASIC METABOLIC PANEL - Abnormal; Notable for the following components:      Result Value   Sodium 133 (*)    CO2 19 (*)    Glucose, Bld 125 (*)    GFR, Estimated 58 (*)    All other components within normal limits  CBC WITH DIFFERENTIAL/PLATELET  MAGNESIUM  TROPONIN I (HIGH SENSITIVITY)     EKG  ED ECG REPORT I, Willy Eddy, the attending physician, personally viewed and interpreted this ECG.   Date: 07/24/2023  EKG Time: 18:48  Rate: 150  Rhythm: svt  Axis: normal  Intervals:normal qt  ST&T Change: no stemi, no depressions    RADIOLOGY Please see ED Course for my review and interpretation.  I personally reviewed all radiographic images ordered to evaluate for the above acute complaints and reviewed radiology reports and findings.  These findings were personally discussed with the patient.  Please see medical record for radiology report.    PROCEDURES:  Critical Care performed: Yes, see critical care procedure note(s)  .Critical Care  Performed by: Roxan Hockey,  Luisa Hart, MD Authorized by: Willy Eddy, MD   Critical care provider statement:    Critical care time (minutes):  35   Critical care was necessary to treat or prevent imminent or life-threatening deterioration of the following conditions:  Circulatory failure   Critical care was time spent personally by me on the following activities:  Ordering and performing treatments and interventions, ordering and review of laboratory studies, ordering and review of radiographic studies, pulse oximetry, re-evaluation of patient's condition, review of old charts, obtaining history from patient or surrogate, examination of patient, evaluation of patient's response to treatment, discussions with primary provider, discussions with consultants and development of treatment plan with patient or  surrogate    MEDICATIONS ORDERED IN ED: Medications  diltiazem (CARDIZEM CD) 24 hr capsule 120 mg (has no administration in time range)  adenosine (ADENOCARD) 6 MG/2ML injection 6 mg (6 mg Intravenous Given 07/24/23 1859)  metoprolol tartrate (LOPRESSOR) injection 5 mg (5 mg Intravenous Given 07/24/23 1902)  metoprolol tartrate (LOPRESSOR) tablet 50 mg (50 mg Oral Given 07/24/23 1920)     IMPRESSION / MDM / ASSESSMENT AND PLAN / ED COURSE  I reviewed the triage vital signs and the nursing notes.                              Differential diagnosis includes, but is not limited to, A-fib with RVR, SVT, A-flutter, dysrhythmia, electrolyte abnormality, dehydration, sepsis  Patient presenting to the ER for evaluation of symptoms as described above.  Based on symptoms, risk factors and considered above differential, this presenting complaint could reflect a potentially life-threatening illness therefore the patient will be placed on continuous pulse oximetry and telemetry for monitoring.  Laboratory evaluation will be sent to evaluate for the above complaints.  With evidence of SVT.  Normotensive protecting her airway.  Mentating well.    Clinical Course as of 07/24/23 2025  Sat Jul 24, 2023  1903 Converted to sinus rhythm after 6 mg of adenosine.  Followed by 5 mg of IV Lopressor push.  Remains well-appearing mentating normally. [PR]  2024 Remains in sinus rhythm well-appearing no acute distress.  Discussed case in consultation with the patient's cardiologist who has recommended adding on Cardizem 120.  Patient is agreeable to plan.  I do believe that she is clinically stable and appropriate for outpatient follow-up. [PR]    Clinical Course User Index [PR] Willy Eddy, MD     FINAL CLINICAL IMPRESSION(S) / ED DIAGNOSES   Final diagnoses:  SVT (supraventricular tachycardia) (HCC)     Rx / DC Orders   ED Discharge Orders          Ordered    diltiazem (CARDIZEM CD) 120 MG  24 hr capsule  Daily        07/24/23 2023             Note:  This document was prepared using Dragon voice recognition software and may include unintentional dictation errors.    Willy Eddy, MD 07/24/23 2026

## 2023-08-02 ENCOUNTER — Other Ambulatory Visit: Payer: Self-pay

## 2023-08-02 ENCOUNTER — Other Ambulatory Visit: Payer: Self-pay | Admitting: Cardiovascular Disease

## 2023-08-12 ENCOUNTER — Ambulatory Visit: Payer: Medicare Other

## 2023-08-12 ENCOUNTER — Ambulatory Visit: Payer: Medicare Other | Attending: Medical | Admitting: Medical

## 2023-08-12 ENCOUNTER — Encounter: Payer: Self-pay | Admitting: Medical

## 2023-08-12 VITALS — BP 122/80 | HR 62 | Ht 66.0 in | Wt 151.4 lb

## 2023-08-12 DIAGNOSIS — I471 Supraventricular tachycardia, unspecified: Secondary | ICD-10-CM | POA: Diagnosis not present

## 2023-08-12 DIAGNOSIS — I10 Essential (primary) hypertension: Secondary | ICD-10-CM | POA: Diagnosis not present

## 2023-08-12 DIAGNOSIS — Z79899 Other long term (current) drug therapy: Secondary | ICD-10-CM

## 2023-08-12 DIAGNOSIS — I480 Paroxysmal atrial fibrillation: Secondary | ICD-10-CM

## 2023-08-12 DIAGNOSIS — I479 Paroxysmal tachycardia, unspecified: Secondary | ICD-10-CM

## 2023-08-12 MED ORDER — DILTIAZEM HCL 30 MG PO TABS: 30.0000 mg | ORAL_TABLET | Freq: Every day | ORAL | 3 refills | Status: DC | PRN

## 2023-08-12 NOTE — Progress Notes (Signed)
Cardiology Office Note:    Date:  08/12/2023   ID:  [name], DOB [birth year], MRN [id number]  PCP:  [name], MD  [facility] Cardiologist:  [name], MD  [facility] Electrophysiologist:  None   Referring MD: [name], MD   Chief Complaint: 1 year follow-up/ER follow-up  History of Present Illness:    Brandi Scott is a 89 y.o. female with a hx of valvular heart disease, PE F on Eliquis, frequent PVCs, hypertension, and hyperlipidemia who is being seen for 1 year follow-up.  Patient underwent previous stress testing in 2013 that was nonischemic.  Echo in 2015 showed mild MR, TR, PR and trivial AI.  Heart monitor for palpitations in 2019 showed 46 episodes of SVT and PAF less than 1% burden.  She was started on Eliquis and beta-blocker therapy.  Patient was last seen in October 2023 reporting palpitations related to anxiety and activity.  No changes were made at that time.  Patient was seen in the ER twice in October for SVT with rates in the 150s, converted to NSR with adenosine. Work-up was otherwise unremarkable. She was started on diltiazem.   Today, the patient reports intermittent episodes of tachyarrhythmia. She doesn't drink caffeine or alcohol. She stays fairly active at home. She started cardizem and seems to help, but is still having episodes. Episodes have lasted over an hour. Nothing seems to precipitate it. She denies chest pain, SOB, or LLE.  She reports compliance with Eliquis.  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 01/2012 MV: EF 74%, no ischemia.   Hyperlipidemia    Osteoarthritis of both hips    And knees as well as back.    PAF (paroxysmal atrial fibrillation) (HCC)    a. 10/2017 Event monitor: 46 SVTs (longest 19 beats, avg 110 bpm). Afib (<1% burden w/  avg HR 142)-->Eliquis (CHA2DS2VASc = 4).   Valvular heart disease    a. 2013 Echo: Moderate MR and moderate-severe TR on echocardiogram; b. Echo 2015: mild MR & TR. Nl EF, mild LVH.   Vertigo    none in several years    Past Surgical History:  Procedure Laterality Date   BREAST EXCISIONAL BIOPSY Right 1971, 1981   negative   CATARACT EXTRACTION W/PHACO Right 03/28/2019   Procedure: CATARACT EXTRACTION PHACO AND INTRAOCULAR LENS PLACEMENT (IOC) RIGHT;  Surgeon: Galen Manila, MD;  Location: [facility];  Service: Ophthalmology;  Laterality: Right;   COLONOSCOPY     Holter Monitor  May 2013   @ [facility]: Occasional PVCs (benign)   NM MYOVIEW LTD  May 2013; Dec 2015   @ [facility]: a) 2013: Exercise for 2:30 min (only), hypertensive response no ischemic changes. He has 74%, normal wall motion, no evidence of ischemia or infarction.; b) Ex 3:00 min (dyspnea) 3.3 METs HR 146 (106% MPHR) - no ECG changes, E 70%, no RWMA, No ischemia or infarction   ORIF ANKLE FRACTURE Left 09/07/2016   Procedure: OPEN REDUCTION INTERNAL FIXATION (ORIF) ANKLE FRACTURE;  Surgeon: Donato Heinz, MD;  Location: [facility];  Service: Orthopedics;  Laterality: Left;   TONGUE BIOPSY     TONSILLECTOMY     TOTAL ABDOMINAL HYSTERECTOMY W/ BILATERAL SALPINGOOPHORECTOMY     TRANSTHORACIC ECHOCARDIOGRAM  May 2013; May 2015   @ [facility]: a) 2013 : Normal LV function, EF 65%, mild BiAE, Mild LVH, Mod MR, Mod-Severe TR; b) normal LV size, EF> 55%, mild LVH, mild MR, mild TR and    Current Medications: Current Meds  Medication Sig   apixaban (ELIQUIS) 5 MG TABS tablet Take 1 tablet (5 mg total) by mouth 2 (two) times daily.   Calcium Carb-Cholecalciferol 600-800 MG-UNIT TABS Take 1 tablet by mouth daily.   Cholecalciferol 25 MCG (1000 UT) capsule Take 2,000 Units by mouth daily.    diltiazem (CARDIZEM CD) 120 MG 24 hr capsule Take 1 capsule (120 mg total) by  mouth daily.   diltiazem (CARDIZEM) 30 MG tablet Take 1 tablet (30 mg total) by mouth daily as needed (for heart rate greater then 120).   lisinopril (ZESTRIL) 20 MG tablet Take 1 tablet by mouth once daily   metoprolol tartrate (LOPRESSOR) 50 MG tablet Take 1 tablet by mouth twice daily   Probiotic Product (PROBIOTIC DAILY PO) Take by mouth daily.   vitamin E 400 UNIT capsule Take 400 Units by mouth daily.     Allergies:   Amoxicillin-pot clavulanate   Social History   Socioeconomic History   Marital status: Widowed    Spouse name: Not on file   Number of children: Not on file   Years of education: Not on file   Highest education level: Not on file  Occupational History   Not on file  Tobacco Use   Smoking status: Never   Smokeless tobacco: Never  Vaping Use   Vaping status: Never Used  Substance and Sexual Activity   Alcohol use: No   Drug use: No   Sexual activity: Not on file  Other Topics Concern   Not on file  Social History Narrative   Not on file   Social Determinants of Health   Financial Resource Strain: Patient Declined (01/07/2023)   Received from [facility]   Overall Financial Resource Strain (CARDIA)    Difficulty of Paying Living Expenses: Patient declined  Food Insecurity: Patient Declined (01/07/2023)   Received [facility]   Hunger Vital Sign    Worried About Running Out of Food in the Last Year: Patient declined    Ran Out of Food in the Last Year: Patient declined  Transportation Needs: Patient Declined (01/07/2023)   Received from [facility]   PRAPARE - Transportation    In the past 12 months, has lack of transportation kept you from medical appointments or from getting medications?: Patient declined    Lack of Transportation (Non-Medical): Patient declined  Physical Activity: Not on file  Stress: Not on file  Social Connections: Not on file     Family History: [name]  history includes Alzheimer's disease in her mother; Heart attack in her father; Heart disease in her brother and maternal grandmother; Heart failure in her mother; Hypertension in her father; Stomach cancer in her brother. There is no history of Breast cancer.  ROS:   Please see the history of present illness.     All other systems reviewed and are negative.  EKGs/Labs/Other Studies Reviewed:    The following studies were reviewed today:  Heart monitor 2019  Event Monitor   Predominant underlying rhythm was Sinus Rhythm.  Patient had a min HR of 53 bpm, max HR of 172 bpm, and avg HR of 74 bpm.   46 Supraventricular Tachycardia runs occurred,  the longest lasting 19 beats with an avg rate of 110 bpm.    Atrial Fibrillation occurred (<1% burden), ranging from 98-172 bpm (avg of 142 bpm), the longest lasting 15 mins 14 secs with an avg rate of 144 bpm. Atrial Fibrillation was detected within +/- 45 seconds of symptomatic patient event(s).    Isolated SVEs were rare (<1.0%), SVE Couplets were rare (<1.0%), and SVE Triplets were rare (<1.0%). Isolated VEs were rare (<1.0%), and no VE Couplets or VE Triplets were present. Ventricular Trigeminy was present.   Signed, Dossie Arbour, MD, Ph.D [facility]   EKG:  EKG is ordered today.  The ekg ordered today demonstrates NSR 62bpm, nonspecific t wave changes  Recent Labs: 07/24/2023: BUN 20; Creatinine, Ser 0.93; Hemoglobin 14.6; Magnesium 1.9; Platelets 276; Potassium 3.8; Sodium 133  Recent Lipid Panel No results found for: "CHOL", "TRIG", "HDL", "CHOLHDL", "VLDL", "LDLCALC", "LDLDIRECT"    Physical Exam:    VS:  BP 122/80 (BP Location: Left Arm, Patient Position: Sitting, Cuff Size: Normal)   Pulse 62   Ht 5\' 6"  (1.676 m)   Wt 151 lb 6 oz (68.7 kg)   SpO2 95%   BMI 24.43 kg/m     Wt Readings from Last 3 Encounters:  08/12/23 151 lb 6 oz (68.7 kg)  07/24/23 149 lb (67.6 kg)  07/06/23 152 lb 1.9 oz (69 kg)     GEN:  Well  nourished, well developed in no acute distress HEENT: Normal NECK: No JVD; No carotid bruits LYMPHATICS: No lymphadenopathy CARDIAC: RRR, no murmurs, rubs, gallops RESPIRATORY:  Clear to auscultation without rales, wheezing or rhonchi  ABDOMEN: Soft, non-tender, non-distended MUSCULOSKELETAL:  No edema; No deformity  SKIN: Warm and dry NEUROLOGIC:  Alert and oriented x 3 PSYCHIATRIC:  Normal affect   ASSESSMENT:    1. Paroxysmal SVT (supraventricular tachycardia) (HCC)   2. Paroxysmal atrial fibrillation (HCC)   3. Medication management   4. Essential hypertension    PLAN:    In order of problems listed above:  SVT 2 recent ER visits for SVT converted with sinus rhythm after adenosine and Lopressor.  Labs were overall unremarkable.  Today, patient is in normal sinus rhythm.  She is taking diltiazem 120 mg daily and Lopressor 50 mg twice daily.  She reports episodes are better, but still occurring.  She had an episode last night that lasted an hour.  Patient is symptomatic during these episodes. Unable to increase medication due to borderline HR. I will check a TSH.  Prior heart monitor in 2019 showed 46 runs of SVT and A-fib less than 1% burden.  I will prescribe diltiazem 30 mg to take as needed for heart rate greater than 120bpm. I will check a two week heart monitor and an echocardiogram.  I will refer patient to EP for further recommendations.  Pafib Plan for heart monitor as above.  Continue Eliquis 5 mg twice daily for stroke prophylaxis.  Continue Cardizem and Lopressor for rate control.  HTN BP is normal today, continue current medications.   Disposition: Follow up in 3 month(s) with MD/APP    Signed, Tavaughn Silguero David Stall, PA-C  08/12/2023 12:33 PM    [facility]

## 2023-08-12 NOTE — Patient Instructions (Addendum)
Medication Instructions:  Take diltiazem 30 MG as needed for heart rate greater then 120.  *If you need a refill on your cardiac medications before your next appointment, please call your pharmacy*   Lab Work: Your provider would like for you to have following labs drawn today TSH.    If you have labs (blood work) drawn today and your tests are completely normal, you will receive your results only by: MyChart Message (if you have MyChart) OR A paper copy in the mail If you have any lab test that is abnormal or we need to change your treatment, we will call you to review the results.   Testing/Procedures: Your physician has requested that you have an echocardiogram. Echocardiography is a painless test that uses sound waves to create images of your heart. It provides your doctor with information about the size and shape of your heart and how well your heart's chambers and valves are working.   You may receive an ultrasound enhancing agent through an IV if needed to better visualize your heart during the echo. This procedure takes approximately one hour.  There are no restrictions for this procedure.  This will take place at [address] [facility]) [address], [place] [address]  Please note: We ask at that you not bring children with you during ultrasound (echo/ vascular) testing. Due to room size and safety concerns, children are not allowed in the ultrasound rooms during exams. Our front office staff cannot provide observation of children in our lobby area while testing is being conducted. An adult accompanying a patient to their appointment will only be allowed in the ultrasound room at the discretion of the ultrasound technician under special circumstances. We apologize for any inconvenience.   Heart Monitor:  Your physician has requested you wear a ZIO monitor for 14  days.  Your monitor will be mailed to your home address within 3-5 business days. This is sent via [employer] Ex  from [employer]. However, if you have not received your monitor after 5 business days please send [place] a MyChart message or call the office at [phone], so we may follow up on this for you.   This monitor is a medical device (single patch monitor) that records the heart's electrical activity. Doctors most often use these monitors to diagnose arrhythmias. Arrhythmias are problems with the speed or rhythm of the heartbeat.   [employer] supplies 1 patch per enrollment. Additional stickers are not available.  Please DO NOT apply the patch if you will be having a Nuclear Stress Test, Echocardiogram, Cardiac CT, Cardiac MRI, Chest X-ray during the period you would be wearing the monitor. The patch cannot be worn during these tests.  You cannot remove and re-apply the ZIO patch monitor.   Applying the Monitor: Once you receive your monitor, this will include a small razor, abrader, and 4 alcohol pads. Shave hair from upper left chest Rub abrader disc in 40 strokes over the left upper chest as indicated in your monitor instructions Clean area with 4 enclosed alcohol pads (there may be a mild & brief stinging sensation over the newly abraded area, but this is normal). Let dry Apply patch as indicated in monitor instructions. Patch will be placed under collarbone on the left side of the chest with arrow pointing upward. Rub adhesive wings for 2 minutes. Remove white label marked "1". Remove the white label marked "2". Rub patch adhesive wings for an 2 minutes.  While looking in a mirror,  press and release button in the center of the patch. You may hear a "click". A small green light will flash 4-6 times and then stop. This will be your indicator that the monitor has been turned on.  Wearing the Monitor: Avoid showering during the first 24 hours of wearing the monitor.  After 24 hours you may shower with the patch on. Take brief showers with your back facing the shower head.  Avoid excessive  sweating to help maximize wear time. Do not submerge the device, no hot tubs, and no swimming pools. Keep any lotions or oils away from the patch. Press the button if you feel a symptom. You will hear a small click. Record date, time, and symptoms in the Patient Logbook or App.  Monitor Issues: Call [employer] Customer Care at [phone] if you have questions regarding your Zio Patch Monitor. Call them immediately if you see an orange/ amber colored light blinking on your monitor. If your monitor falls off and you cannot get this reapplied or if you need suggestions for securing your monitor call [employer] at [phone].   Returning the Monitor: Once you have completed wearing your monitor, follow instructions on the last 2 pages of the Patient Logbook. Stick monitor patch on to the last page of the Patient Logbook.  Place Patient Logbook with monitor in the return box provided. Use locking tab on box and tape box closed securely. The return box has pre-paid postage on it.  Place the return box in the regular [place] Mail box as soon as possible It will take anywhere from 1-2 weeks for your provider to receive and review your results once you mail this back. If for some reason you have misplaced your return box then call our office and we can provide another box and/or mail it off for you.   Billing  and Patient Assistance Program Information: We have supplied [employer] with any of your insurance information on file for billing purposes. [employer] offers a sliding scale Patient Assistance Program for patients that do not have insurance, or whose insurance does not completely cover the cost of the ZIO monitor. You must apply for the Patient Assistance Program to qualify for this discounted rate. To apply, please call [employer] at [phone], select option 1, ask to apply for the Patient Assistance Program. [employer] will ask your household income, and how many people are in your household.  They will quote your out-of-pocket cost based on that information. [employer] will also be able to set up for a 9-month, interest-free payment plan if needed.      Follow-Up: At [facility], you and your health needs are our priority.  As part of our continuing mission to provide you with exceptional heart care, we have created designated Provider Care Teams.  These Care Teams include your primary Cardiologist (physician) and Advanced Practice Providers (APPs -  Physician Assistants and Nurse Practitioners) who all work together to provide you with the care you need, when you need it.  We recommend signing up for the patient portal called "MyChart".  Sign up information is provided on this After Visit Summary.  MyChart is used to connect with patients for Virtual Visits (Telemedicine).  Patients are able to view lab/test results, encounter notes, upcoming appointments, etc.  Non-urgent messages can be sent to your provider as well.   To learn more about what you can do with MyChart, go to [email or web address].    Your next appointment:   3  month(s)  Provider:   You may see [name], MD or one of the following Advanced Practice Providers on your designated Care Team:   Nicolasa Ducking, NP Eula Listen, PA-C Cadence Fransico Michael, PA-C Charlsie Quest, NP Carlos Levering, NP    Other Instructions Referral for Cardiac Electrophysiology

## 2023-08-13 LAB — TSH: TSH: 1.89 u[IU]/mL (ref 0.450–4.500)

## 2023-08-15 DIAGNOSIS — I479 Paroxysmal tachycardia, unspecified: Secondary | ICD-10-CM

## 2023-08-27 ENCOUNTER — Other Ambulatory Visit: Payer: Self-pay | Admitting: Cardiovascular Disease

## 2023-09-09 ENCOUNTER — Other Ambulatory Visit: Payer: Medicare Other

## 2023-09-30 ENCOUNTER — Ambulatory Visit: Payer: Medicare Other | Attending: Medical

## 2023-09-30 DIAGNOSIS — I471 Supraventricular tachycardia, unspecified: Secondary | ICD-10-CM | POA: Diagnosis not present

## 2023-09-30 LAB — ECHOCARDIOGRAM COMPLETE
Area-P 1/2: 3.27 cm2
S' Lateral: 1.8 cm

## 2023-10-05 ENCOUNTER — Institutional Professional Consult (permissible substitution): Payer: Medicare Other | Admitting: Cardiology

## 2023-10-12 ENCOUNTER — Institutional Professional Consult (permissible substitution): Payer: Medicare Other | Admitting: Cardiology

## 2023-10-18 NOTE — Progress Notes (Unsigned)
Electrophysiology Office Note:   Date:  10/19/2023  ID:  CARMELETA Scott, DOB 02/15/1929, MRN [id number]  Primary Cardiologist: [name], MD Primary Heart Failure: None Electrophysiologist: Nobie Putnam, MD      History of Present Illness:   Brandi Scott is a 89 y.o. female with h/o paroxysmal atrial fibrillation on Eliquis, hypertension, and hyperlipidemia who is being seen today for evaluation of her atrial fibrillation at the request of Cadence Furth, [place].  Patient had 2 visits to the emergency room in October 2024 for palpitations and was found to be in SVT with rates in the 150s.  She was started on diltiazem after her last visit, in addition to metoprolol which she was on previously. She reported continued palpitations during her follow-up visit with general cardiology.  ZIO monitor was ordered which showed frequent episodes of SVT, longest lasting 5 and a half minutes. She reports that her episodes seem to have decreased in severity since starting diltiazem but continue to occur. Prior to starting diltiazem, they were more severe with dizziness and lightheadedness. Otherwise doing well.   Review of systems complete and found to be negative unless listed in HPI.   EP Information / Studies Reviewed:    EKG is ordered today. Personal review as below.  EKG Interpretation Date/Time:  Tuesday October 19 2023 10:09:28 EST Ventricular Rate:  66 PR Interval:  166 QRS Duration:  76 QT Interval:  392 QTC Calculation: 410 R Axis:   32  Text Interpretation: Sinus rhythm with marked sinus arrhythmia Low voltage QRS When compared with ECG of 12-Aug-2023 11:37, No significant change was found Confirmed by Nobie Putnam [phone]) on 10/19/2023 10:37:28 AM   Echo 09/30/2023: Normal LV size and function.  LVEF 60 to 65%.  Grade 2 diastolic dysfunction. Normal RV size and function. Mildly dilated left atrium. Aortic sclerosis without stenosis.  Zio 08/2023: 170 Supraventricular  Tachycardia runs occurred, the run with the fastest interval lasting 5 mins 24 secs with a max rate of 156 bpm  (avg 141 bpm); the run with the fastest interval was also the longest.  1 patient triggered event recorded Supraventricular Tachycardia was detected within +/- 45 seconds of symptomatic patient event(s).    Isolated SVEs were occasional (1.6%, 22004), SVE Couplets were rare (<1.0%, 5052), and SVE Triplets were rare (<1.0%, 431).  Isolated VEs were rare (<1.0%), and no VE Couplets or VE Triplets were present.    Zio 10/2017: Predominant underlying rhythm was Sinus Rhythm.  Patient had a min HR of 53 bpm, max HR of 172 bpm, and avg HR of 74 bpm.   46 Supraventricular Tachycardia runs occurred,  the longest lasting 19 beats with an avg rate of 110 bpm.    Atrial Fibrillation occurred (<1% burden), ranging from 98-172 bpm (avg of 142 bpm), the longest lasting 15 mins 14 secs with an avg rate of 144 bpm. Atrial Fibrillation was detected within +/- 45 seconds of symptomatic patient event(s).    Isolated SVEs were rare (<1.0%), SVE Couplets were rare (<1.0%), and SVE Triplets were rare (<1.0%). Isolated VEs were rare (<1.0%), and no VE Couplets or VE Triplets were present. Ventricular Trigeminy was present.       Physical Exam:   VS:  BP (!) 150/82 (BP Location: Left Arm, Patient Position: Sitting, Cuff Size: Normal)   Pulse 66   Ht 5\' 6"  (1.676 m)   Wt 153 lb (69.4 kg)   SpO2 95%   BMI 24.69 kg/m  Wt Readings from Last 3 Encounters:  10/19/23 153 lb (69.4 kg)  08/12/23 151 lb 6 oz (68.7 kg)  07/24/23 149 lb (67.6 kg)     GEN: Well nourished, well developed in no acute distress NECK: No JVD CARDIAC: Normal rate and regular rhythm RESPIRATORY:  Clear to auscultation without rales, wheezing or rhonchi  ABDOMEN: Soft, non-tender, non-distended EXTREMITIES:  No edema; No deformity   ASSESSMENT AND PLAN:   Brandi Scott is a 89 y.o. female with h/o paroxysmal atrial  fibrillation on Eliquis, hypertension, and hyperlipidemia who is being seen today for evaluation of her atrial fibrillation at the request of Cadence Furth, [place].  Patient has both SVT and paroxysmal atrial fibrillation documented on prior outpatient cardiac monitors.  She continues to have episodes despite being on metoprolol and Cardizem.  She is symptomatic with these.  Due to her advanced age she is not a candidate for catheter ablation.  I believe antiarrhythmic drug therapy is reasonable.  #. Palpitations:  #. SVT: #. Paroxysmal atrial fibrillation:  -Patient continues to have episodes despite diltiazem and metoprolol based on the results of her Zio. We will order a pharm nuclear stress to look for scar. If normal then she can start flecainide 50mg  BID. She has normal PR interval and normal QRS. If stress test is abnormal then we will start amiodarone.  #. Secondary hypercoagulable state due to atrial fibrillation: -CHADSVASC score of 4. -Continue Eliquis. She is tolerating this without issue.   Follow up with EP APP  in 6 weeks.   Signed, Nobie Putnam, MD

## 2023-10-19 ENCOUNTER — Encounter: Payer: Self-pay | Admitting: Cardiology

## 2023-10-19 ENCOUNTER — Ambulatory Visit: Payer: Medicare Other | Attending: Cardiology | Admitting: Cardiology

## 2023-10-19 VITALS — BP 150/82 | HR 66 | Ht 66.0 in | Wt 153.0 lb

## 2023-10-19 DIAGNOSIS — I471 Supraventricular tachycardia, unspecified: Secondary | ICD-10-CM

## 2023-10-19 DIAGNOSIS — I480 Paroxysmal atrial fibrillation: Secondary | ICD-10-CM | POA: Diagnosis not present

## 2023-10-19 DIAGNOSIS — R002 Palpitations: Secondary | ICD-10-CM | POA: Diagnosis not present

## 2023-10-19 DIAGNOSIS — I479 Paroxysmal tachycardia, unspecified: Secondary | ICD-10-CM | POA: Diagnosis not present

## 2023-10-19 DIAGNOSIS — D6869 Other thrombophilia: Secondary | ICD-10-CM | POA: Diagnosis not present

## 2023-10-19 NOTE — Patient Instructions (Signed)
Medication Instructions:  Your physician recommends that you continue on your current medications as directed. Please refer to the Current Medication list given to you today.  *If you need a refill on your cardiac medications before your next appointment, please call your pharmacy*  Testing/Procedures: Your physician has requested that you have a lexiscan myoview. For further information please visit [email or web address] Please follow instruction sheet, as given.  Follow-Up: At [facility], you and your health needs are our priority.  As part of our continuing mission to provide you with exceptional heart care, we have created designated Provider Care Teams.  These Care Teams include your primary Cardiologist (physician) and Advanced Practice Providers (APPs -  Physician Assistants and Nurse Practitioners) who all work together to provide you with the care you need, when you need it.  Your next appointment:   6 weeks  Provider:   Sherie Don, NP

## 2023-11-11 ENCOUNTER — Encounter
Admission: RE | Admit: 2023-11-11 | Discharge: 2023-11-11 | Disposition: A | Discharge status: Home / Self Care | Payer: Medicare Other | Source: Ambulatory Visit | Attending: Cardiology | Admitting: Cardiology

## 2023-11-11 DIAGNOSIS — D6869 Other thrombophilia: Secondary | ICD-10-CM | POA: Insufficient documentation

## 2023-11-11 DIAGNOSIS — R002 Palpitations: Secondary | ICD-10-CM | POA: Insufficient documentation

## 2023-11-11 DIAGNOSIS — I480 Paroxysmal atrial fibrillation: Secondary | ICD-10-CM | POA: Insufficient documentation

## 2023-11-11 DIAGNOSIS — I471 Supraventricular tachycardia, unspecified: Secondary | ICD-10-CM | POA: Insufficient documentation

## 2023-11-11 MED ORDER — REGADENOSON 0.4 MG/5ML IV SOLN
0.4000 mg | Freq: Once | INTRAVENOUS | Status: AC
  Administered 2023-11-11: 0.4 mg via INTRAVENOUS

## 2023-11-11 MED ORDER — TECHNETIUM TC 99M TETROFOSMIN IV KIT
10.0000 | PACK | Freq: Once | INTRAVENOUS | Status: AC | PRN
  Administered 2023-11-11: 10.67 via INTRAVENOUS

## 2023-11-11 MED ORDER — TECHNETIUM TC 99M TETROFOSMIN IV KIT
30.0000 | PACK | Freq: Once | INTRAVENOUS | Status: AC | PRN
  Administered 2023-11-11: 31.38 via INTRAVENOUS

## 2023-11-12 LAB — NM MYOCAR MULTI W/SPECT W/WALL MOTION / EF
LV dias vol: 34 mL (ref 46–106)
LV sys vol: 5 mL
MPHR: 128 {beats}/min
Nuc Stress EF: 85 %
Peak HR: 97 {beats}/min
Percent HR: 75 %
Rest HR: 66 {beats}/min
Rest Nuclear Isotope Dose: 10.7 mCi
SDS: 2
SRS: 0
SSS: 0
ST Depression (mm): 0 mm
Stress Nuclear Isotope Dose: 31.4 mCi
TID: 0.95

## 2023-11-15 ENCOUNTER — Telehealth: Payer: Self-pay

## 2023-11-15 MED ORDER — FLECAINIDE ACETATE 50 MG PO TABS: 50.0000 mg | ORAL_TABLET | Freq: Two times a day (BID) | ORAL | 3 refills | Status: AC

## 2023-11-15 NOTE — Telephone Encounter (Signed)
-----   Message from Brandi Scott sent at 11/12/2023  2:10 PM EST ----- Normal stress test. Can start flecainide 50mg  twice daily. EKG 1 week later.

## 2023-11-15 NOTE — Telephone Encounter (Signed)
 The patient has been notified of the result and verbalized understanding.  All questions (if any) were answered. Frutoso Schatz, RN 11/15/2023 12:10 PM

## 2023-11-18 ENCOUNTER — Ambulatory Visit: Payer: Medicare Other | Admitting: Medical

## 2023-11-18 NOTE — Progress Notes (Deleted)
  Cardiology Office Note:  .   Date:  11/18/2023  ID:  Brandi Scott, DOB 08/08/1929, MRN [id number] PCP: [name], MD  [facility] Providers Cardiologist:  [name], MD Electrophysiologist:  Nobie Putnam, MD { Click to update primary MD,subspecialty MD or APP then REFRESH:1}   History of Present Illness: .   Brandi Scott is a 89 y.o. 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   Patient underwent previous stress testing in 2013 that was nonischemic.  Echo in 2015 showed mild MR, TR, PR and trivial AI.  Heart monitor for palpitations in 2019 showed 46 episodes of SVT and PAF less than 1% burden.  She was started on Eliquis and beta-blocker therapy.   Patient was last seen in October 2023 reporting palpitations related to anxiety and activity.  No changes were made at that time.   Patient was seen in the ER twice in October for SVT with rates in the 150s, converted to NSR with adenosine. Work-up was otherwise unremarkable. She was started on diltiazem and referred to EP.   The patient saw EP 10/19/23 and a stress test was ordered to look for scar. IF normal, can start flecainide.     ROS: ***  Studies Reviewed: .        *** Risk Assessment/Calculations:   {Does this patient have ATRIAL FIBRILLATION?[phone]} No BP recorded.  {Refresh Note OR Click here to enter BP  :1}***       Physical Exam:   VS:  There were no vitals taken for this visit.   Wt Readings from Last 3 Encounters:  10/19/23 153 lb (69.4 kg)  08/12/23 151 lb 6 oz (68.7 kg)  07/24/23 149 lb (67.6 kg)    GEN: Well nourished, well developed in no acute distress NECK: No JVD; No carotid bruits CARDIAC: ***RRR, no murmurs, rubs, gallops RESPIRATORY:  Clear to auscultation without rales, wheezing or rhonchi  ABDOMEN: Soft, non-tender, non-distended EXTREMITIES:  No edema; No deformity    ASSESSMENT AND PLAN: .   ***    {Are you ordering a CV Procedure (e.g. stress test, cath, DCCV, TEE, etc)?   Press F2        :[id number]}  Dispo: ***  Signed, Nathasha Fiorillo David Stall, PA-C

## 2023-11-24 ENCOUNTER — Other Ambulatory Visit: Payer: Self-pay | Admitting: Cardiovascular Disease

## 2023-12-02 ENCOUNTER — Ambulatory Visit: Payer: Medicare Other | Attending: Cardiology | Admitting: Cardiology

## 2023-12-02 VITALS — BP 144/84 | HR 58 | Ht 66.0 in | Wt 154.6 lb

## 2023-12-02 DIAGNOSIS — I480 Paroxysmal atrial fibrillation: Secondary | ICD-10-CM | POA: Diagnosis not present

## 2023-12-02 DIAGNOSIS — I471 Supraventricular tachycardia, unspecified: Secondary | ICD-10-CM | POA: Diagnosis not present

## 2023-12-02 DIAGNOSIS — D6869 Other thrombophilia: Secondary | ICD-10-CM | POA: Diagnosis not present

## 2023-12-02 DIAGNOSIS — Z5181 Encounter for therapeutic drug level monitoring: Secondary | ICD-10-CM

## 2023-12-02 DIAGNOSIS — I10 Essential (primary) hypertension: Secondary | ICD-10-CM

## 2023-12-02 DIAGNOSIS — Z79899 Other long term (current) drug therapy: Secondary | ICD-10-CM

## 2023-12-02 NOTE — Progress Notes (Signed)
 Electrophysiology Clinic Note    Date:  12/02/2023  Patient ID:  Brandi, Scott September 02, 1929, MRN [id number] PCP:  Brandi Mina, MD  Cardiologist:  [name], MD Electrophysiologist: Nobie Putnam, MD   Discussed the use of AI scribe software for clinical note transcription with the patient, who gave verbal consent to proceed.   Patient Profile    Chief Complaint: SVT, palpitation follow-up  History of Present Illness: Brandi Scott is a 89 y.o. female with PMH notable for parox AFib, SVT, HTN; seen today for Nobie Putnam, MD for routine electrophysiology followup.  She saw Dr. Jimmey Scott 10/19/23 for EP consult of SVT. She had two ER visits 06/2023 for palpitations, found to be in SVT. At the visit with Dr. Jimmey Scott, she was on 120mg  dilt and 50mg  lopressor with improvement in palpitations, though still having episodes. He recommended flecainide after a normal stress test.   On follow-up today, she has had significant improvement in palpitations, she doesn't think she has had any episodes since starting flecainide. She is tolerating flecainide well.  She does not check BP regularly at home.  She continues to take eliquis BID. She does note bleeding with wiping after BM. This is not a new finding for her d/t ongoing constipation.  She is anticipating dental work in the near future, has a receeding gum and roots that need to be removed.  She denies chest pain, chest pressure, SOB.    AAD History: Flecainide - started 10/2023    ROS:  Please see the history of present illness. All other systems are reviewed and otherwise negative.    Physical Exam    VS:  BP (!) 144/84 (BP Location: Left Arm, Patient Position: Sitting, Cuff Size: Normal)   Pulse (!) 58   Ht 5\' 6"  (1.676 m)   Wt 154 lb 9.6 oz (70.1 kg)   SpO2 94%   BMI 24.95 kg/m  BMI: Body mass index is 24.95 kg/m.  Wt Readings from Last 3 Encounters:  12/02/23 154 lb 9.6 oz (70.1 kg)  10/19/23 153 lb (69.4  kg)  08/12/23 151 lb 6 oz (68.7 kg)     GEN- The patient is well appearing, alert and oriented x 3 today.   Lungs- Clear to ausculation bilaterally, normal work of breathing.  Heart- Regular rate and rhythm, no murmurs, rubs or gallops Extremities- Trace peripheral edema, warm, dry    Studies Reviewed   Previous EP, cardiology notes.    EKG is ordered. Personal review of EKG from today shows:    EKG Interpretation Date/Time:  Thursday December 02 2023 13:23:38 EST Ventricular Rate:  58 PR Interval:  200 QRS Duration:  84 QT Interval:  428 QTC Calculation: 420 R Axis:   9  Text Interpretation: Sinus bradycardia Confirmed by Sherie Don [phone]) on 12/02/2023 1:31:12 PM    10/18/2022 EKG - SR with sinus arrhythmia PR - 166, QRS 76, QT 392  NM myocardial spect, 11/11/2023 Pharmacological myocardial perfusion imaging study with no significant  ischemia Normal wall motion, EF estimated at >65% No EKG changes concerning for ischemia at peak stress or in recovery. CT attenuation correction images with mild aortic atherosclerosis, no significant coronary calcification Low risk scan  TTE, 09/30/2023  1. Left ventricular ejection fraction, by estimation, is 60 to 65%. The left ventricle has normal function. The left ventricle has no regional wall motion abnormalities. Left ventricular diastolic parameters are consistent with Grade II diastolic dysfunction (pseudonormalization). The average left ventricular  global longitudinal strain is -23.9 %. The global longitudinal strain is normal.   2. Right ventricular systolic function is normal. The right ventricular size is normal.   3. Left atrial size was mildly dilated.   4. The mitral valve is normal in structure. Trivial mitral valve regurgitation.   5. The aortic valve is tricuspid. Aortic valve regurgitation is not visualized. Aortic valve sclerosis/calcification is present, without any evidence of aortic stenosis.   6. The inferior vena  cava is dilated in size with <50% respiratory variability, suggesting right atrial pressure of 15 mmHg.   Long term monitor, 09/03/2023 Patient had a min HR of 47 bpm, max HR of 156 bpm, and avg HR of 68 bpm.    170 Supraventricular Tachycardia runs occurred, the run with the fastest interval lasting 5 mins 24 secs with a max rate of 156 bpm (avg 141 bpm); the run with the fastest interval was also the longest.  1 patient triggered event recorded Supraventricular Tachycardia was detected within +/- 45 seconds of symptomatic patient event(s).    Isolated SVEs were occasional (1.6%, 22004), SVE Couplets were rare (<1.0%, 5052), and SVE Triplets were rare (<1.0%, 431).  Isolated VEs were rare (<1.0%), and no VE Couplets or VE Triplets were present.  Long term monitor, 11/19/2017 Predominant underlying rhythm was Sinus Rhythm.  Patient had a min HR of 53 bpm, max HR of 172 bpm, and avg HR of 74 bpm.   46 Supraventricular Tachycardia runs occurred,  the longest lasting 19 beats with an avg rate of 110 bpm.    Atrial Fibrillation occurred (<1% burden), ranging from 98-172 bpm (avg of 142 bpm), the longestlasting 15 mins 14 secs with an avg rate of 144 bpm. Atrial Fibrillation was detected within +/- 45 seconds of symptomatic patient event(s).    Isolated SVEs were rare (<1.0%), SVE Couplets were rare (<1.0%), and SVE Triplets were rare (<1.0%). Isolated VEs were rare (<1.0%), and no VE Couplets or VE Triplets were present. Ventricular Trigeminy was present.    Assessment and Plan     #) parox AFib #) SVT #) Palpitations Recently started flecainide 50mg  BID with significant improvement in palpitation episodes EKG with stable intervals Continue 120mg  diltiazem Continue 50mg  lopressor BID Update BMP, mag today  #) Hypercoag d/t parox afib CHA2DS2-VASc Score = at least 4 [CHF History: 0, HTN History: 1, Diabetes History: 0, Stroke History: 0, Vascular Disease History: 0, Age Score: 2,  Gender Score: 1].  Therefore, the patient's annual risk of stroke is 4.8 %.    Stroke ppx - 5mg  eliquis BID, appropriately dosed Ongoing rectal bleeding with bowel movements Update CBC today Recommend stool softeners +/- miralax for constipation  #) HTN Slightly elevated in office today Recommended she check BP 2-3 times per week and follow-up with PCP for BP mgmt Continue dilt and lopressor as above Continue 20mg  lisinopril daily  #) Dental procedure Ok to pause eliquis for dental procedures Recommend dental provider send cardiology clearance to office once surgical plan is known       Current medicines are reviewed at length with the patient today.   The patient does not have concerns regarding her medicines.  The following changes were made today:  none  Labs/ tests ordered today include:  Orders Placed This Encounter  Procedures   Basic metabolic panel   CBC   Magnesium   EKG 12-Lead     Disposition: Follow up with Dr. Jimmey Scott or EP APP in 6 months  Signed, Sherie Don, NP  12/02/23  1:52 PM  Electrophysiology [facility]

## 2023-12-02 NOTE — Patient Instructions (Addendum)
 Medication Instructions:  The current medical regimen is effective;  continue present plan and medications.  *If you need a refill on your cardiac medications before your next appointment, please call your pharmacy*   Lab Work: Your provider would like for you to have following labs drawn today BMET, MAG, CBC.   If you have labs (blood work) drawn today and your tests are completely normal, you will receive your results only by: MyChart Message (if you have MyChart) OR A paper copy in the mail If you have any lab test that is abnormal or we need to change your treatment, we will call you to review the results.   Follow-Up: At [facility], you and your health needs are our priority.  As part of our continuing mission to provide you with exceptional heart care, we have created designated Provider Care Teams.  These Care Teams include your primary Cardiologist (physician) and Advanced Practice Providers (APPs -  Physician Assistants and Nurse Practitioners) who all work together to provide you with the care you need, when you need it.  We recommend signing up for the patient portal called "MyChart".  Sign up information is provided on this After Visit Summary.  MyChart is used to connect with patients for Virtual Visits (Telemedicine).  Patients are able to view lab/test results, encounter notes, upcoming appointments, etc.  Non-urgent messages can be sent to your provider as well.   To learn more about what you can do with MyChart, go to [email or web address].    Your next appointment:   6 month(s)  Provider:   Nobie Putnam, MD or Sherie Don, NP

## 2023-12-03 ENCOUNTER — Telehealth: Payer: Self-pay | Admitting: Cardiology

## 2023-12-03 LAB — CBC
Hematocrit: 40 % (ref 34.0–46.6)
Hemoglobin: 13.6 g/dL (ref 11.1–15.9)
MCH: 30.8 pg (ref 26.6–33.0)
MCHC: 34 g/dL (ref 31.5–35.7)
MCV: 91 fL (ref 79–97)
Platelets: 245 10*3/uL (ref 150–450)
RBC: 4.42 x10E6/uL (ref 3.77–5.28)
RDW: 12.4 % (ref 11.7–15.4)
WBC: 6.1 10*3/uL (ref 3.4–10.8)

## 2023-12-03 LAB — BASIC METABOLIC PANEL
BUN/Creatinine Ratio: 22 (ref 12–28)
BUN: 20 mg/dL (ref 10–36)
CO2: 22 mmol/L (ref 20–29)
Calcium: 9.5 mg/dL (ref 8.7–10.3)
Chloride: 103 mmol/L (ref 96–106)
Creatinine, Ser: 0.89 mg/dL (ref 0.57–1.00)
Glucose: 85 mg/dL (ref 70–99)
Potassium: 4.8 mmol/L (ref 3.5–5.2)
Sodium: 139 mmol/L (ref 134–144)
eGFR: 61 mL/min/{1.73_m2} (ref >59)

## 2023-12-03 LAB — MAGNESIUM: Magnesium: 2 mg/dL (ref 1.6–2.3)

## 2023-12-03 NOTE — Telephone Encounter (Signed)
 Pt c/o medication issue:  1. Name of Medication: flecainide (TAMBOCOR) 50 MG tablet   2. How are you currently taking this medication (dosage and times per day)?  Take 1 tablet (50 mg total) by mouth 2 (two) times daily.   3. Are you having a reaction (difficulty breathing--STAT)? No   4. What is your medication issue? Patient says that she is having very bad headaches while taking medication. Want to know if she should still take medication or not

## 2023-12-03 NOTE — Telephone Encounter (Signed)
 Patient started Flecainide 11/15/23. Patient with complaint of headache that started yesterday. Patient reports that the headache is worse with laying down. Patient reports that she also has cold symptoms but has had the symptoms for a couple of weeks. Patient would like Dr. Jimmey Ralph to be notified and would like to know if thinks her headache is coming from the Flecainide.

## 2023-12-06 NOTE — Telephone Encounter (Signed)
 Called and spoke with patient. Patient denies headache today. States that she now believes the headache was coming from her cold symptoms.

## 2024-01-20 ENCOUNTER — Other Ambulatory Visit (HOSPITAL_COMMUNITY): Payer: Self-pay

## 2024-01-20 ENCOUNTER — Telehealth: Payer: Self-pay | Admitting: Cardiovascular Disease

## 2024-01-20 ENCOUNTER — Telehealth: Payer: Self-pay | Admitting: Pharmacy Technician

## 2024-01-20 DIAGNOSIS — I480 Paroxysmal atrial fibrillation: Secondary | ICD-10-CM

## 2024-01-20 MED ORDER — APIXABAN 5 MG PO TABS: 5.0000 mg | ORAL_TABLET | Freq: Two times a day (BID) | ORAL | 1 refills | Status: DC

## 2024-01-20 NOTE — Telephone Encounter (Signed)
*  STAT* If patient is at the pharmacy, call can be transferred to refill team.   1. Which medications need to be refilled? (please list name of each medication and dose if known) apixaban (ELIQUIS) 5 MG TABS tablet  2. Which pharmacy/location (including street and city if local pharmacy) is medication to be sent to? [facility] [address] [place] - [address]  3. Do they need a 30 day or 90 day supply? 30

## 2024-01-20 NOTE — Telephone Encounter (Signed)
 PAP: Patient assistance application for Eliquis through [employer] ([employer]) has been mailed to pt's home address on file. Provider portion of application will be faxed to provider's office.

## 2024-01-20 NOTE — Telephone Encounter (Signed)
 Pt c/o medication issue:  1. Name of Medication:   apixaban  (ELIQUIS ) 5 MG TABS tablet    2. How are you currently taking this medication (dosage and times per day)?    3. Are you having a reaction (difficulty breathing--STAT)? no  4. What is your medication issue? Patient calling about the patient's assistant for this medication. A new application needs to be put in, she states the current one has expire. Please advise

## 2024-01-20 NOTE — Telephone Encounter (Signed)
 Refill request

## 2024-01-20 NOTE — Telephone Encounter (Signed)
 Prescription refill request for Eliquis  received. Indication: a fib Last office visit: 12/02/23 Scr:0.89 epic 12/02/23 Age: 89 Weight: 70kg

## 2024-01-24 NOTE — Telephone Encounter (Signed)
 Form signed and faxed

## 2024-01-24 NOTE — Telephone Encounter (Signed)
 Completed Provider portion faxed

## 2024-01-28 NOTE — Telephone Encounter (Signed)
   I dated the form and faxed back in. Still waiting on patient portion from patient.

## 2024-02-03 ENCOUNTER — Ambulatory Visit: Attending: Medical | Admitting: Medical

## 2024-02-03 ENCOUNTER — Encounter: Payer: Self-pay | Admitting: Medical

## 2024-02-03 ENCOUNTER — Ambulatory Visit: Payer: Medicare Other | Admitting: Medical

## 2024-02-03 VITALS — BP 126/66 | HR 61 | Ht 63.0 in | Wt 150.4 lb

## 2024-02-03 DIAGNOSIS — I10 Essential (primary) hypertension: Secondary | ICD-10-CM | POA: Diagnosis not present

## 2024-02-03 DIAGNOSIS — I480 Paroxysmal atrial fibrillation: Secondary | ICD-10-CM

## 2024-02-03 DIAGNOSIS — I471 Supraventricular tachycardia, unspecified: Secondary | ICD-10-CM | POA: Diagnosis not present

## 2024-02-03 NOTE — Telephone Encounter (Signed)
 Patient planning to bring her portion into her office visit 02/03/24 along with oop, SSI, medicare card

## 2024-02-03 NOTE — Progress Notes (Signed)
 Cardiology Office Note:  .   Date:  02/03/2024  ID:  Brandi Scott, DOB Dec 13, 1928, MRN [id number] PCP: Lyle San, MD  [facility] Providers Cardiologist:  Belva Boyden, MD Electrophysiologist:  Ardeen Kohler, MD {   History of Present Illness: .   Brandi Scott is a 89 y.o. female  with a hx of valvular heart disease, pSVT, PAF on Eliquis , hypertension, and hyperlipidemia who is being seen for follow-up for SVT.   Patient underwent previous stress testing in 2013 that was nonischemic.  Echo in 2015 showed mild MR, TR, PR and trivial AI.  Heart monitor for palpitations in 2019 showed 46 episodes of SVT and PAF less than 1% burden.  She was started on Eliquis  and beta-blocker therapy.   Patient was last seen in October 2023 reporting palpitations related to anxiety and activity.  No changes were made at that time.   Patient was seen in the ER twice in October for SVT with rates in the 150s, converted to NSR with adenosine . Work-up was otherwise unremarkable. She was started on diltiazem . She was seen in the office and heart monitor, echo were ordered. She was in NSR, but unable to increase rate control medication due to borderline heart rate. Echo showed LVEF 60-65%, no Wma, G2DD. Heart monitor showed 170 runs of SVT, dectecd within 45 seconds of symptomatic patient events. The patient was referred to EP. She saw Dr. Daneil Dunker, who recommended flecanide after a normal strss test. She was last seen 12/02/23 reporting improvement of symptoms.   Today, the patient has been doing good. She has been much better on the flecainide . She denies any further episodes. She denies bleeding issues with Eliquis , she had hemorrhoids in the past. At home she does not use a cane or walker.  Studies Reviewed: Aaron Aas   EKG Interpretation Date/Time:  Thursday Feb 03 2024 14:31:20 EDT Ventricular Rate:  61 PR Interval:  190 QRS Duration:  84 QT Interval:  426 QTC Calculation: 428 R Axis:   -11  Text  Interpretation: Normal sinus rhythm Cannot rule out Anterior infarct , age undetermined When compared with ECG of 02-Dec-2023 13:23, No significant change was found Confirmed by Gennaro Khat, Lamere Lightner ([id number]) on 02/03/2024 2:38:14 PM    NM myocardial spect, 11/11/2023 Pharmacological myocardial perfusion imaging study with no significant  ischemia Normal wall motion, EF estimated at >65% No EKG changes concerning for ischemia at peak stress or in recovery. CT attenuation correction images with mild aortic atherosclerosis, no significant coronary calcification Low risk scan   TTE, 09/30/2023  1. Left ventricular ejection fraction, by estimation, is 60 to 65%. The left ventricle has normal function. The left ventricle has no regional wall motion abnormalities. Left ventricular diastolic parameters are consistent with Grade II diastolic dysfunction (pseudonormalization). The average left ventricular global longitudinal strain is -23.9 %. The global longitudinal strain is normal.   2. Right ventricular systolic function is normal. The right ventricular size is normal.   3. Left atrial size was mildly dilated.   4. The mitral valve is normal in structure. Trivial mitral valve regurgitation.   5. The aortic valve is tricuspid. Aortic valve regurgitation is not visualized. Aortic valve sclerosis/calcification is present, without any evidence of aortic stenosis.   6. The inferior vena cava is dilated in size with <50% respiratory variability, suggesting right atrial pressure of 15 mmHg.    Long term monitor, 09/03/2023 Patient had a min HR of 47 bpm, max HR of 156 bpm, and  avg HR of 68 bpm.    170 Supraventricular Tachycardia runs occurred, the run with the fastest interval lasting 5 mins 24 secs with a max rate of 156 bpm (avg 141 bpm); the run with the fastest interval was also the longest.  1 patient triggered event recorded Supraventricular Tachycardia was detected within +/- 45 seconds of symptomatic patient  event(s).    Isolated SVEs were occasional (1.6%, 22004), SVE Couplets were rare (<1.0%, 5052), and SVE Triplets were rare (<1.0%, 431).  Isolated VEs were rare (<1.0%), and no VE Couplets or VE Triplets were present.   Long term monitor, 11/19/2017 Predominant underlying rhythm was Sinus Rhythm.  Patient had a min HR of 53 bpm, max HR of 172 bpm, and avg HR of 74 bpm.   46 Supraventricular Tachycardia runs occurred,  the longest lasting 19 beats with an avg rate of 110 bpm.    Atrial Fibrillation occurred (<1% burden), ranging from 98-172 bpm (avg of 142 bpm), the longestlasting 15 mins 14 secs with an avg rate of 144 bpm. Atrial Fibrillation was detected within +/- 45 seconds of symptomatic patient event(s).    Isolated SVEs were rare (<1.0%), SVE Couplets were rare (<1.0%), and SVE Triplets were rare (<1.0%). Isolated VEs were rare (<1.0%), and no VE Couplets or VE Triplets were present. Ventricular Trigeminy was present.           Physical Exam:   VS:  BP 126/66   Pulse 61   Ht 5\' 3"  (1.6 m)   Wt 150 lb 6.4 oz (68.2 kg)   SpO2 96%   BMI 26.64 kg/m    Wt Readings from Last 3 Encounters:  02/03/24 150 lb 6.4 oz (68.2 kg)  12/02/23 154 lb 9.6 oz (70.1 kg)  10/19/23 153 lb (69.4 kg)    GEN: Well nourished, well developed in no acute distress NECK: No JVD; No carotid bruits CARDIAC: RRR, no murmurs, rubs, gallops RESPIRATORY:  Clear to auscultation without rales, wheezing or rhonchi  ABDOMEN: Soft, non-tender, non-distended EXTREMITIES:  No edema; No deformity   ASSESSMENT AND PLAN: .    pSVT Patient is feeling much better on the flecainide  started by EP.  She denies any further SVT episodes.  EKG today shows normal sinus rhythm with QTc 428 ms, overall stable.  Continue Cardizem  120 mg daily and Lopressor  50 mg twice daily for rate control.  Paroxysmal A-fib Patient denies palpitations or heart racing as above.  Continue Eliquis  twice daily for stroke prophylaxis.  She  denies any further bleeding issues.  Continue Cardizem  and metoprolol  for heart rate control.  Hypertension Blood pressure normal today, continue current medications.       Dispo: Follow-up in 4 months  Signed, Tempest Frankland Rebekah Canada, PA-C

## 2024-02-03 NOTE — Patient Instructions (Signed)
 Medication Instructions:  Your Physician recommend you continue on your current medication as directed.    *If you need a refill on your cardiac medications before your next appointment, please call your pharmacy* Follow-Up: At [facility], you and your health needs are our priority.  As part of our continuing mission to provide you with exceptional heart care, our providers are all part of one team.  This team includes your primary Cardiologist (physician) and Advanced Practice Providers or APPs (Physician Assistants and Nurse Practitioners) who all work together to provide you with the care you need, when you need it.  Your next appointment:   4 month(s)  Provider:   You may see Timothy Gollan, MD or one of the following Advanced Practice Providers on your designated Care Team:   Laneta Pintos, NP Gildardo Labrador, PA-C Varney Gentleman, PA-C Cadence Preston, PA-C Ronald Cockayne, NP Morey Ar, NP    We recommend signing up for the patient portal called "MyChart".  Sign up information is provided on this After Visit Summary.  MyChart is used to connect with patients for Virtual Visits (Telemedicine).  Patients are able to view lab/test results, encounter notes, upcoming appointments, etc.  Non-urgent messages can be sent to your provider as well.   To learn more about what you can do with MyChart, go to [email or web address].

## 2024-02-04 NOTE — Telephone Encounter (Signed)
 Hi, I just spoke to this patient and she said she gave you her application for Eliquis  assistance yesterday during her office visit. I was wondering if it would be possible for someone to fax what she gave to us  so we can send that in for her to [employer], please? Our fax is [phone]. Thank you!

## 2024-02-07 NOTE — Telephone Encounter (Signed)
 Called patient and left a message. We never got the fax last week.

## 2024-02-07 NOTE — Telephone Encounter (Signed)
 Application brought into office and faxed over to [employer]. Unable to locate faxed documents. Application was not completed and she had a out of pocket expense report.

## 2024-02-07 NOTE — Telephone Encounter (Signed)
 Hi, the provider said she gave the application from this patient to a [name]. Is it possible for someone to send us  the application so we can send off for the patient? Our fax is [phone]. I didn't see it scanned in media. I've asked the [name]'s we know and they do not have it. Thank you!

## 2024-02-07 NOTE — Telephone Encounter (Signed)
 Patient called back and her nephew email is [email or web address]. she said she has another application and they will send the email or mail or drop off once its filled out again.

## 2024-02-09 NOTE — Telephone Encounter (Deleted)
 Application faxed and will be attached to media

## 2024-02-10 ENCOUNTER — Telehealth: Payer: Self-pay | Admitting: *Deleted

## 2024-02-10 ENCOUNTER — Other Ambulatory Visit (HOSPITAL_COMMUNITY): Payer: Self-pay

## 2024-02-10 NOTE — Telephone Encounter (Signed)
 Faxed application note in another encounter

## 2024-02-10 NOTE — Telephone Encounter (Signed)
 Patient brought in her patient assistance application and all forms were completed and faxed to our med assist team.

## 2024-02-10 NOTE — Telephone Encounter (Signed)
 Faxed completed application to [employer]- also uploaded to media

## 2024-02-20 ENCOUNTER — Other Ambulatory Visit: Payer: Self-pay | Admitting: Cardiovascular Disease

## 2024-02-23 NOTE — Telephone Encounter (Signed)
 Faxed over income again as requested from [employer]

## 2024-02-29 ENCOUNTER — Other Ambulatory Visit (HOSPITAL_COMMUNITY): Payer: Self-pay

## 2024-02-29 NOTE — Telephone Encounter (Signed)
 Scanned in media.

## 2024-02-29 NOTE — Telephone Encounter (Signed)
  Called patient to let her know and we are going to try and submit the out of pocket expense report at the end of June- patient will call myself or Trevor Fudge

## 2024-06-01 ENCOUNTER — Other Ambulatory Visit (HOSPITAL_COMMUNITY): Payer: Self-pay

## 2024-06-08 ENCOUNTER — Ambulatory Visit: Attending: Medical | Admitting: Medical

## 2024-06-08 ENCOUNTER — Encounter: Payer: Self-pay | Admitting: Medical

## 2024-06-08 VITALS — BP 124/64 | HR 61 | Ht 65.0 in | Wt 151.8 lb

## 2024-06-08 DIAGNOSIS — I471 Supraventricular tachycardia, unspecified: Secondary | ICD-10-CM | POA: Diagnosis not present

## 2024-06-08 DIAGNOSIS — I10 Essential (primary) hypertension: Secondary | ICD-10-CM

## 2024-06-08 DIAGNOSIS — I480 Paroxysmal atrial fibrillation: Secondary | ICD-10-CM

## 2024-06-08 NOTE — Progress Notes (Signed)
 Cardiology Office Note   Date:  06/08/2024  ID:  Brandi Scott, DOB 05-Nov-1928, MRN [id number] PCP: Brandi Agent, MD   [facility] Providers Cardiologist:  Brandi Lunger, MD Electrophysiologist:  Brandi Kitty, MD   History of Present Illness Brandi Scott is a 89 y.o. female with a hx of valvular heart disease, pSVT, PAF on Eliquis , hypertension, and hyperlipidemia who is being seen for follow-up for SVT.   Patient underwent previous stress testing in 2013 that was nonischemic.  Echo in 2015 showed mild MR, TR, PR and trivial AI.  Heart monitor for palpitations in 2019 showed 46 episodes of SVT and PAF less than 1% burden.  She was started on Eliquis  and beta-blocker therapy.  Patient went to the ER twice in October 2024 for SVT with rates in the 150s, converted to normal sinus rhythm with adenosine .  Workup was otherwise unremarkable.  She had been started on diltiazem .  Echo showed LVEF 60 to 65%, grade 2 diastolic dysfunction.  Heart monitor showed 170 runs of SVT detected within 45 seconds of patient symptomatic events.  The patient was referred to EP and she saw Dr. Kitty who recommended flecainide  after normal stress test.  Patient was last seen 02/03/2024 and was doing better on flecainide .  Today, the patient is overall doing well. She denies chest pain or shortness of breath. She has lower leg edema that is stable. She wears compression socks all the time. She feels right leg is weak, so function is limited. She uses a walker most of the time.   Studies Reviewed EKG Interpretation Date/Time:  Thursday June 08 2024 10:49:40 EDT Ventricular Rate:  61 PR Interval:  194 QRS Duration:  84 QT Interval:  424 QTC Calculation: 426 R Axis:   7  Text Interpretation: Normal sinus rhythm Inferior infarct , age undetermined Cannot rule out Anterior infarct (cited on or before 03-Feb-2024) When compared with ECG of 03-Feb-2024 14:31, Inferior infarct is now Present  Confirmed by Brandi Scott ([id number]) on 06/08/2024 10:55:04 AM    NM myocardial spect, 11/11/2023 Pharmacological myocardial perfusion imaging study with no significant  ischemia Normal wall motion, EF estimated at >65% No EKG changes concerning for ischemia at peak stress or in recovery. CT attenuation correction images with mild aortic atherosclerosis, no significant coronary calcification Low risk scan   TTE, 09/30/2023  1. Left ventricular ejection fraction, by estimation, is 60 to 65%. The left ventricle has normal function. The left ventricle has no regional wall motion abnormalities. Left ventricular diastolic parameters are consistent with Grade II diastolic dysfunction (pseudonormalization). The average left ventricular global longitudinal strain is -23.9 %. The global longitudinal strain is normal.   2. Right ventricular systolic function is normal. The right ventricular size is normal.   3. Left atrial size was mildly dilated.   4. The mitral valve is normal in structure. Trivial mitral valve regurgitation.   5. The aortic valve is tricuspid. Aortic valve regurgitation is not visualized. Aortic valve sclerosis/calcification is present, without any evidence of aortic stenosis.   6. The inferior vena cava is dilated in size with <50% respiratory variability, suggesting right atrial pressure of 15 mmHg.    Long term monitor, 09/03/2023 Patient had a min HR of 47 bpm, max HR of 156 bpm, and avg HR of 68 bpm.    170 Supraventricular Tachycardia runs occurred, the run with the fastest interval lasting 5 mins 24 secs with a max rate of 156 bpm (avg 141 bpm); the run  with the fastest interval was also the longest.  1 patient triggered event recorded Supraventricular Tachycardia was detected within +/- 45 seconds of symptomatic patient event(s).    Isolated SVEs were occasional (1.6%, 22004), SVE Couplets were rare (<1.0%, 5052), and SVE Triplets were rare (<1.0%, 431).  Isolated VEs were rare  (<1.0%), and no VE Couplets or VE Triplets were present.   Long term monitor, 11/19/2017 Predominant underlying rhythm was Sinus Rhythm.  Patient had a min HR of 53 bpm, max HR of 172 bpm, and avg HR of 74 bpm.   46 Supraventricular Tachycardia runs occurred,  the longest lasting 19 beats with an avg rate of 110 bpm.    Atrial Fibrillation occurred (<1% burden), ranging from 98-172 bpm (avg of 142 bpm), the longestlasting 15 mins 14 secs with an avg rate of 144 bpm. Atrial Fibrillation was detected within +/- 45 seconds of symptomatic patient event(s).    Isolated SVEs were rare (<1.0%), SVE Couplets were rare (<1.0%), and SVE Triplets were rare (<1.0%). Isolated VEs were rare (<1.0%), and no VE Couplets or VE Triplets were present. Ventricular Trigeminy was present.    Physical Exam VS:  BP 124/64   Pulse 61   Ht 5' 5 (1.651 m)   Wt 151 lb 12.8 oz (68.9 kg)   SpO2 94%   BMI 25.26 kg/m        Wt Readings from Last 3 Encounters:  06/08/24 151 lb 12.8 oz (68.9 kg)  02/03/24 150 lb 6.4 oz (68.2 kg)  12/02/23 154 lb 9.6 oz (70.1 kg)    GEN: Well nourished, well developed in no acute distress NECK: No JVD; No carotid bruits CARDIAC: RRR, no murmurs, rubs, gallops RESPIRATORY:  Clear to auscultation without rales, wheezing or rhonchi  ABDOMEN: Soft, non-tender, non-distended EXTREMITIES:  No edema; No deformity   ASSESSMENT AND PLAN  Paroxysmal A-fib pSVT Patient is doing much better on flecainide .  She denies significant symptoms.  EKG shows normal sinus rhythm with stable Qtc of .  Continue flecainide  50 mg twice daily, Cardizem  120 mg twice daily, Lopressor  50 mg twice daily.  Continue Eliquis  for stroke prophylaxis.  Hypertension Blood pressure today is normal, continue current medications.       Dispo: Follow-up in 4 months  Signed, Brandi Wan VEAR Fishman, PA-C

## 2024-06-08 NOTE — Patient Instructions (Signed)
 Medication Instructions:  Your physician recommends that you continue on your current medications as directed. Please refer to the Current Medication list given to you today.    *If you need a refill on your cardiac medications before your next appointment, please call your pharmacy*  Lab Work: No labs ordered today    Testing/Procedures: No test ordered today   Follow-Up: At [facility], you and your health needs are our priority.  As part of our continuing mission to provide you with exceptional heart care, our providers are all part of one team.  This team includes your primary Cardiologist (physician) and Advanced Practice Providers or APPs (Physician Assistants and Nurse Practitioners) who all work together to provide you with the care you need, when you need it.  Your next appointment:   4 month(s)  Provider:   You may see Timothy Gollan, MD or one of the following Advanced Practice Providers on your designated Care Team:   Lonni Meager, NP Lesley Maffucci, PA-C Bernardino Bring, PA-C Cadence Gallatin, PA-C Tylene Lunch, NP Barnie Hila, NP

## 2024-06-19 DIAGNOSIS — N390 Urinary tract infection, site not specified: Secondary | ICD-10-CM | POA: Insufficient documentation

## 2024-06-19 NOTE — Assessment & Plan Note (Addendum)
 Unclear if true UTIs vs bacteriuria in the setting of chronic OAB PVR 0cc today Ongoing issues with constipation  We reviewed the core principles of recurrent UTI management in women, including relevant physiology, proper diagnostic evaluation, and the full spectrum of treatment options. I outlined the range of management strategies, noting the varying degrees of evidence supporting each. For many women, attention to lifestyle and conservative behavioral measures can significantly reduce infection frequency and improve quality of life. First-line steps include maintaining adequate hydration, avoiding constipation, and practicing proper bladder hygiene. Over-the-counter supplements such as cranberry juice or extracts may be incorporated into the diet, as they have demonstrated benefit in reducing UTI frequency. Other strategies with more limited evidence--but low risk and ease of implementation--include D-mannose and probiotics.  For postmenopausal women, we discussed the use of vaginal estrogen, its role in addressing vaginal atrophy, restoring urethrogenital flora, and its evidence-based benefit in reducing recurrent UTIs. In refractory cases, options may include a trial of suppressive antibiotic therapy with low-dose daily administration. Alternative approaches include post-coital antibiotic prophylaxis or a "pill-in-the-pocket" regimen for self-treatment in select uncomplicated cases.   - I am not overly concerned at this point, she is emptying her bladder well.  I do think we may build to improve her bowel regimen, increase MiraLAX to 1 cap daily, potentially add magnesium  citrate versus milk of magnesia.  Goal is soft daily bowel movement -If she has recurrent acute UTI symptoms-please submit urinalysis and urine culture -No prophylactic treatment at this point

## 2024-06-19 NOTE — Progress Notes (Signed)
 06/29/24 1:41 PM   Brandi Scott Oct 29, 1928 [id number]  CC: Recurrent UTIs   HPI: 89 year old female here for initial evaluation of urinary frequency, recurrent UTIs Seen by PCP 05/19/2024-presumed UTI, prescribed Omnicef 7d UA 06/26/24 - negative Describes nocturnal urgency as only presenting UTI sign-denies dysuria denies daytime change in symptoms Prior antibiotic therapy does seem to resolve symptoms, fairly quick  previous cultures with citrobacter koseri (12/31/23 - good sensitivity) and e.coli (10/04/23 - resistant to tetracycline and bactrim)   Previously followed by Dr. Kassie   - I personally reviewed several pages of records   - prior pelvic floor PT, Kegels, Gemtesa 75mg  every day    - No longer taking Gemtesa-although baseline urinary habits are stable, she is not overly bothered  hx of valvular heart disease, pSVT, PAF on Eliquis , hypertension    PMH: Past Medical History:  Diagnosis Date   Chronic radicular low back pain    Essential hypertension    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 01/2012 MV: EF 74%, no ischemia.   Hyperlipidemia    Osteoarthritis of both hips    And knees as well as back.    PAF (paroxysmal atrial fibrillation) (HCC)    a. 10/2017 Event monitor: 46 SVTs (longest 19 beats, avg 110 bpm). Afib (<1% burden w/ avg HR 142)-->Eliquis  (CHA2DS2VASc = 4).   Valvular heart disease    a. 2013 Echo: Moderate MR and moderate-severe TR on echocardiogram; b. Echo 2015: mild MR & TR. Nl EF, mild LVH.   Vertigo    none in several years    Surgical History: Past Surgical History:  Procedure Laterality Date   BREAST EXCISIONAL BIOPSY Right 1971, 1981   negative   CATARACT EXTRACTION W/PHACO Right 03/28/2019   Procedure: CATARACT EXTRACTION PHACO AND INTRAOCULAR LENS PLACEMENT (IOC) RIGHT;  Surgeon: Jaye Fallow, MD;  Location: [facility];  Service: Ophthalmology;   Laterality: Right;   COLONOSCOPY     Holter Monitor  May 2013   @ [facility]iology: Occasional PVCs (benign)   NM MYOVIEW  LTD  May 2013; Dec 2015   @ [facility] Cardiology: a) 2013: Exercise for 2:30 min (only), hypertensive response no ischemic changes. He has 74%, normal wall motion, no evidence of ischemia or infarction.; b) Ex 3:00 min (dyspnea) 3.3 METs HR 146 (106% MPHR) - no ECG changes, E 70%, no RWMA, No ischemia or infarction   ORIF ANKLE FRACTURE Left 09/07/2016   Procedure: OPEN REDUCTION INTERNAL FIXATION (ORIF) ANKLE FRACTURE;  Surgeon: Lynwood SHAUNNA Hue, MD;  Location: [facility] OR[facility];  Service: Orthopedics;  Laterality: Left;   TONGUE BIOPSY     TONSILLECTOMY     TOTAL ABDOMINAL HYSTERECTOMY W/ BILATERAL SALPINGOOPHORECTOMY     TRANSTHORACIC ECHOCARDIOGRAM  May 2013; May 2015   @ [facility] Cardiology: a) 2013 : Normal LV function, EF 65%, mild BiAE, Mild LVH, Mod MR, Mod-Severe TR; b) normal LV size, EF> 55%, mild LVH, mild MR, mild TR and    Family History: Family History  Problem Relation Age of Onset   Alzheimer's disease Mother    Heart failure Mother    Hypertension Father    Heart attack Father    Heart disease Brother    Stomach cancer Brother    Heart disease Maternal Grandmother    Breast cancer Neg Hx     Social History:  reports that she  has never smoked. She has never used smokeless tobacco. She reports that she does not drink alcohol and does not use drugs.      Physical Exam: BP (!) 156/77 (BP Location: Left Arm, Patient Position: Sitting, Cuff Size: Normal)   Pulse 65   Ht 5' 5 (1.651 m)   Wt 149 lb (67.6 kg)   BMI 24.79 kg/m    Constitutional:  Alert and oriented, No acute distress. Cardiovascular: No clubbing, cyanosis, or edema. Respiratory: Normal respiratory effort, no increased work of breathing. GI: Nondistended Skin: No rashes, bruises or suspicious lesions. Neurologic: Grossly intact, no focal deficits, moving all 4  extremities. Psychiatric: Normal mood and affect.  Laboratory Data: previous cultures with citrobacter koseri (12/31/23 - good sensitivity) and e.coli (10/04/23 - resistant to tetracycline and bactrim)    Pertinent Imaging: N/A    Assessment & Plan:    Recurrent UTI Assessment & Plan: Unclear if true UTIs vs bacteriuria in the setting of chronic OAB PVR 0cc today Ongoing issues with constipation  We reviewed the core principles of recurrent UTI management in women, including relevant physiology, proper diagnostic evaluation, and the full spectrum of treatment options. I outlined the range of management strategies, noting the varying degrees of evidence supporting each. For many women, attention to lifestyle and conservative behavioral measures can significantly reduce infection frequency and improve quality of life. First-line steps include maintaining adequate hydration, avoiding constipation, and practicing proper bladder hygiene. Over-the-counter supplements such as cranberry juice or extracts may be incorporated into the diet, as they have demonstrated benefit in reducing UTI frequency. Other strategies with more limited evidence--but low risk and ease of implementation--include D-mannose and probiotics.  For postmenopausal women, we discussed the use of vaginal estrogen, its role in addressing vaginal atrophy, restoring urethrogenital flora, and its evidence-based benefit in reducing recurrent UTIs. In refractory cases, options may include a trial of suppressive antibiotic therapy with low-dose daily administration. Alternative approaches include post-coital antibiotic prophylaxis or a "pill-in-the-pocket" regimen for self-treatment in select uncomplicated cases.   - I am not overly concerned at this point, she is emptying her bladder well.  I do think we may build to improve her bowel regimen, increase MiraLAX to 1 cap daily, potentially add magnesium  citrate versus milk of magnesia.  Goal  is soft daily bowel movement -If she has recurrent acute UTI symptoms-please submit urinalysis and urine culture -No prophylactic treatment at this point  Orders: -     Bladder Scan (Post Void Residual) in office  OAB (overactive bladder) Assessment & Plan: Chronic OAB, mixed UI Previously on Gemtesa 75mg  + pelvic floor PT (~2023)  Stable symptoms, mild bother at most at baseline Primary concern today was recent UTI, discussed below If baseline OAB symptoms worsen, we will consider restarting Gemtesa versus Sanctura       Orlie Cundari, MD 06/29/2024  [facility] [address], [address] [place], [place] [address] [phone]

## 2024-06-29 ENCOUNTER — Ambulatory Visit (INDEPENDENT_AMBULATORY_CARE_PROVIDER_SITE_OTHER): Admitting: Urology

## 2024-06-29 ENCOUNTER — Encounter: Payer: Self-pay | Admitting: Urology

## 2024-06-29 VITALS — BP 156/77 | HR 65 | Ht 65.0 in | Wt 149.0 lb

## 2024-06-29 DIAGNOSIS — N390 Urinary tract infection, site not specified: Secondary | ICD-10-CM | POA: Diagnosis not present

## 2024-06-29 DIAGNOSIS — N3281 Overactive bladder: Secondary | ICD-10-CM | POA: Insufficient documentation

## 2024-06-29 LAB — BLADDER SCAN AMB NON-IMAGING

## 2024-06-29 NOTE — Assessment & Plan Note (Addendum)
 Chronic OAB, mixed UI Previously on Gemtesa 75mg  + pelvic floor PT (~2023)  Stable symptoms, mild bother at most at baseline Primary concern today was recent UTI, discussed below If baseline OAB symptoms worsen, we will consider restarting Gemtesa versus Sanctura

## 2024-07-10 ENCOUNTER — Other Ambulatory Visit: Payer: Self-pay | Admitting: Cardiovascular Disease

## 2024-07-10 DIAGNOSIS — I480 Paroxysmal atrial fibrillation: Secondary | ICD-10-CM

## 2024-07-10 NOTE — Telephone Encounter (Signed)
 Eliquis  5mg  refill request received. Patient is 89 years old, weight-67.6kg, Crea-0.89 on 12/02/23, Diagnosis-Afib, and last seen by Cadence Furth on 06/08/24. Dose is appropriate based on dosing criteria. Will send in refill to requested pharmacy.

## 2024-07-12 ENCOUNTER — Other Ambulatory Visit: Payer: Self-pay | Admitting: Cardiovascular Disease

## 2024-07-14 ENCOUNTER — Other Ambulatory Visit: Payer: Self-pay | Admitting: Cardiovascular Disease

## 2024-07-17 ENCOUNTER — Telehealth: Payer: Self-pay | Admitting: Pharmacy Technician

## 2024-07-17 ENCOUNTER — Other Ambulatory Visit (HOSPITAL_COMMUNITY): Payer: Self-pay

## 2024-07-17 NOTE — Telephone Encounter (Addendum)
 Patient Advocate Encounter   The patient was approved for a [employer] grant that will help cover the cost of eliquis /diltiazem  Total amount awarded, 7500.  Effective: 06/17/24 - 06/16/25   APW:[id number] ERW:[id number] Hmnle:[id number] PI:[id number] [employer] ID: [id number]   Pharmacy provided with approval and processing information. Patient informed via telephone

## 2024-07-18 MED ORDER — DILTIAZEM HCL ER COATED BEADS 120 MG PO CP24: 120.0000 mg | ORAL_CAPSULE | Freq: Every day | ORAL | 11 refills | Status: AC

## 2024-10-05 ENCOUNTER — Telehealth: Payer: Self-pay | Admitting: Pharmacy Technician

## 2024-10-05 NOTE — Telephone Encounter (Signed)
 Patient wants a call back to her nephew Gini Bers, [phone]) regarding getting assistance with her medication.

## 2024-10-05 NOTE — Telephone Encounter (Signed)
" ° °  The patient was approved for a [employer] grant that will help cover the cost of eliquis /diltiazem  Total amount awarded, 7500.  Effective: 06/17/24 - 06/16/25   APW:[id number] ERW:[id number] Hmnle:[id number] PI:[id number] [employer] ID: [id number]   I called the nephew and lmom [employer] grant still good and just needs to ask [facility] to fill on ins and hw grant "

## 2024-10-11 ENCOUNTER — Other Ambulatory Visit: Payer: Self-pay | Admitting: Cardiovascular Disease

## 2024-11-02 ENCOUNTER — Ambulatory Visit: Admitting: Medical

## 2024-11-02 ENCOUNTER — Encounter: Payer: Self-pay | Admitting: Medical

## 2024-11-02 ENCOUNTER — Other Ambulatory Visit: Payer: Self-pay | Admitting: Cardiology

## 2024-11-02 VITALS — BP 115/60 | HR 67 | Ht 65.0 in | Wt 150.0 lb

## 2024-11-02 DIAGNOSIS — I10 Essential (primary) hypertension: Secondary | ICD-10-CM | POA: Diagnosis not present

## 2024-11-02 DIAGNOSIS — Z79899 Other long term (current) drug therapy: Secondary | ICD-10-CM

## 2024-11-02 DIAGNOSIS — I471 Supraventricular tachycardia, unspecified: Secondary | ICD-10-CM | POA: Diagnosis not present

## 2024-11-02 DIAGNOSIS — I480 Paroxysmal atrial fibrillation: Secondary | ICD-10-CM | POA: Diagnosis not present

## 2024-11-02 NOTE — Patient Instructions (Signed)
 Medication Instructions:  Your physician recommends that you continue on your current medications as directed. Please refer to the Current Medication list given to you today.    *If you need a refill on your cardiac medications before your next appointment, please call your pharmacy*  Lab Work: Your provider would like for you to have following labs drawn today CBC, CMP, TSH, Mg.     Testing/Procedures: No test ordered today   Follow-Up: At [facility], you and your health needs are our priority.  As part of our continuing mission to provide you with exceptional heart care, our providers are all part of one team.  This team includes your primary Cardiologist (physician) and Advanced Practice Providers or APPs (Physician Assistants and Nurse Practitioners) who all work together to provide you with the care you need, when you need it.  Your next appointment:   6 month(s)  Provider:   Fonda Kitty, MD    Your physician recommends that you schedule a follow-up appointment in 1 year with Dr. Gollan

## 2024-11-02 NOTE — Progress Notes (Signed)
 " Cardiology Office Note   Date:  11/02/2024  ID:  Brandi, Scott March 31, 1929, MRN [id number] PCP: Valora Lynwood FALCON, MD  [facility] Providers Cardiologist:  Evalene Lunger, MD Electrophysiologist:  Fonda Kitty, MD   History of Present Illness Brandi Scott is a 89 y.o. female  with a hx of valvular heart disease, pSVT, PAF on Eliquis , hypertension, and hyperlipidemia who is being seen for follow-up for SVT and Afib.   Patient underwent previous stress testing in 2013 that was nonischemic.  Echo in 2015 showed mild MR, TR, PR and trivial AI.  Heart monitor for palpitations in 2019 showed 46 episodes of SVT and PAF less than 1% burden.  She was started on Eliquis  and beta-blocker therapy.   Patient went to the ER twice in October 2024 for SVT with rates in the 150s, converted to normal sinus rhythm with adenosine .  Workup was otherwise unremarkable.  She had been started on diltiazem .  Echo showed LVEF 60 to 65%, grade 2 diastolic dysfunction.  Heart monitor showed 170 runs of SVT detected within 45 seconds of patient symptomatic events.  The patient was referred to EP and she saw Dr. Kitty who recommended flecainide  after normal stress test.  The patient was last seen 05/2024 and was stable from a cardiac perspective.   Today, the patient was overall doing well. No feelings of afib. No chest pain or SOB. No lightheadedness, dizziness, palpitations. She has occasional lower leg edema.   Studies Reviewed      NM myocardial spect, 11/11/2023 Pharmacological myocardial perfusion imaging study with no significant  ischemia Normal wall motion, EF estimated at >65% No EKG changes concerning for ischemia at peak stress or in recovery. CT attenuation correction images with mild aortic atherosclerosis, no significant coronary calcification Low risk scan   TTE, 09/30/2023  1. Left ventricular ejection fraction, by estimation, is 60 to 65%. The left ventricle has normal function. The  left ventricle has no regional wall motion abnormalities. Left ventricular diastolic parameters are consistent with Grade II diastolic dysfunction (pseudonormalization). The average left ventricular global longitudinal strain is -23.9 %. The global longitudinal strain is normal.   2. Right ventricular systolic function is normal. The right ventricular size is normal.   3. Left atrial size was mildly dilated.   4. The mitral valve is normal in structure. Trivial mitral valve regurgitation.   5. The aortic valve is tricuspid. Aortic valve regurgitation is not visualized. Aortic valve sclerosis/calcification is present, without any evidence of aortic stenosis.   6. The inferior vena cava is dilated in size with <50% respiratory variability, suggesting right atrial pressure of 15 mmHg.    Long term monitor, 09/03/2023 Patient had a min HR of 47 bpm, max HR of 156 bpm, and avg HR of 68 bpm.    170 Supraventricular Tachycardia runs occurred, the run with the fastest interval lasting 5 mins 24 secs with a max rate of 156 bpm (avg 141 bpm); the run with the fastest interval was also the longest.  1 patient triggered event recorded Supraventricular Tachycardia was detected within +/- 45 seconds of symptomatic patient event(s).    Isolated SVEs were occasional (1.6%, 22004), SVE Couplets were rare (<1.0%, 5052), and SVE Triplets were rare (<1.0%, 431).  Isolated VEs were rare (<1.0%), and no VE Couplets or VE Triplets were present.   Long term monitor, 11/19/2017 Predominant underlying rhythm was Sinus Rhythm.  Patient had a min HR of 53 bpm, max HR of  172 bpm, and avg HR of 74 bpm.   46 Supraventricular Tachycardia runs occurred,  the longest lasting 19 beats with an avg rate of 110 bpm.    Atrial Fibrillation occurred (<1% burden), ranging from 98-172 bpm (avg of 142 bpm), the longestlasting 15 mins 14 secs with an avg rate of 144 bpm. Atrial Fibrillation was detected within +/- 45 seconds of  symptomatic patient event(s).    Isolated SVEs were rare (<1.0%), SVE Couplets were rare (<1.0%), and SVE Triplets were rare (<1.0%). Isolated VEs were rare (<1.0%), and no VE Couplets or VE Triplets were present. Ventricular Trigeminy was present.       Physical Exam VS:  There were no vitals taken for this visit.       Wt Readings from Last 3 Encounters:  06/29/24 149 lb (67.6 kg)  06/08/24 151 lb 12.8 oz (68.9 kg)  02/03/24 150 lb 6.4 oz (68.2 kg)    GEN: Well nourished, well developed in no acute distress NECK: No JVD; No carotid bruits CARDIAC: RRR, no murmurs, rubs, gallops RESPIRATORY:  Clear to auscultation without rales, wheezing or rhonchi  ABDOMEN: Soft, non-tender, non-distended EXTREMITIES:  No edema; No deformity   ASSESSMENT AND PLAN  Paroxysmal Afib pSVT EKG shows NSR, 67 bpm, first degree AV block ( ), Qtc - intervals are mildly longer. I will check labs today,  CBC, CMET, TSH, and Mag. Continue Eliquis  5mg  BID. She denies bleeding issues, she does have h/o hemorrhoids. Continue Flecainide  50mg  BID,  diltiazem  120mg  daily and metoprolol  50mg  daily. Continue to monitor EKG. We will have patient see in 6 months.  HTN BP is 115/60. Continue lisinopril  20 mg daily, Lopressor  50 mg twice daily, Cardizem  120 mg daily.    Dispo: Follow-up in 1 year  Signed, Ravon Mcilhenny VEAR Fishman, PA-C   "

## 2024-11-03 ENCOUNTER — Ambulatory Visit: Payer: Self-pay | Admitting: Medical

## 2024-11-03 LAB — CBC
Hematocrit: 42.1 % (ref 34.0–46.6)
Hemoglobin: 14.1 g/dL (ref 11.1–15.9)
MCH: 31.1 pg (ref 26.6–33.0)
MCHC: 33.5 g/dL (ref 31.5–35.7)
MCV: 93 fL (ref 79–97)
Platelets: 250 10*3/uL (ref 150–450)
RBC: 4.54 x10E6/uL (ref 3.77–5.28)
RDW: 12.2 % (ref 11.7–15.4)
WBC: 5.9 10*3/uL (ref 3.4–10.8)

## 2024-11-03 LAB — COMPREHENSIVE METABOLIC PANEL WITH GFR
ALT: 12 [IU]/L (ref 0–32)
AST: 21 [IU]/L (ref 0–40)
Albumin: 4 g/dL (ref 3.6–4.6)
Alkaline Phosphatase: 91 [IU]/L (ref 48–129)
BUN/Creatinine Ratio: 38 — ABNORMAL HIGH (ref 12–28)
BUN: 30 mg/dL (ref 10–36)
Bilirubin Total: 0.4 mg/dL (ref 0.0–1.2)
CO2: 19 mmol/L — ABNORMAL LOW (ref 20–29)
Calcium: 9.5 mg/dL (ref 8.7–10.3)
Chloride: 104 mmol/L (ref 96–106)
Creatinine, Ser: 0.8 mg/dL (ref 0.57–1.00)
Globulin, Total: 2.8 g/dL (ref 1.5–4.5)
Glucose: 83 mg/dL (ref 70–99)
Potassium: 4.7 mmol/L (ref 3.5–5.2)
Sodium: 141 mmol/L (ref 134–144)
Total Protein: 6.8 g/dL (ref 6.0–8.5)
eGFR: 69 mL/min/{1.73_m2}

## 2024-11-03 LAB — TSH: TSH: 2.12 u[IU]/mL (ref 0.450–4.500)

## 2024-11-03 LAB — MAGNESIUM: Magnesium: 2 mg/dL (ref 1.6–2.3)

## 2024-12-11 ENCOUNTER — Ambulatory Visit: Admitting: Podiatry

## 2025-06-28 ENCOUNTER — Ambulatory Visit: Admitting: Physician Assistant
